# Patient Record
Sex: Female | Born: 1970 | Race: White | Hispanic: No | Smoking: Current every day smoker
Health system: Southern US, Community
[De-identification: ages and names within clinical notes are randomized; demographics above are authoritative.]

## PROBLEM LIST (undated history)

## (undated) DIAGNOSIS — IMO0001 Reserved for inherently not codable concepts without codable children: Secondary | ICD-10-CM

## (undated) DIAGNOSIS — F410 Panic disorder [episodic paroxysmal anxiety] without agoraphobia: Secondary | ICD-10-CM

## (undated) DIAGNOSIS — M1990 Unspecified osteoarthritis, unspecified site: Secondary | ICD-10-CM

## (undated) DIAGNOSIS — J45909 Unspecified asthma, uncomplicated: Secondary | ICD-10-CM

## (undated) DIAGNOSIS — H9190 Unspecified hearing loss, unspecified ear: Secondary | ICD-10-CM

## (undated) DIAGNOSIS — K5900 Constipation, unspecified: Secondary | ICD-10-CM

## (undated) DIAGNOSIS — J189 Pneumonia, unspecified organism: Secondary | ICD-10-CM

## (undated) DIAGNOSIS — K219 Gastro-esophageal reflux disease without esophagitis: Secondary | ICD-10-CM

## (undated) DIAGNOSIS — M797 Fibromyalgia: Secondary | ICD-10-CM

## (undated) DIAGNOSIS — R569 Unspecified convulsions: Secondary | ICD-10-CM

## (undated) DIAGNOSIS — T884XXA Failed or difficult intubation, initial encounter: Secondary | ICD-10-CM

## (undated) HISTORY — PX: SHOULDER SURGERY: SHX246

## (undated) HISTORY — PX: TONSILLECTOMY AND ADENOIDECTOMY: SUR1326

## (undated) HISTORY — PX: OTHER SURGICAL HISTORY: SHX169

## (undated) HISTORY — PX: ABDOMINAL HYSTERECTOMY: SHX81

## (undated) HISTORY — PX: CERVICAL FUSION: SHX112

## (undated) HISTORY — PX: APPENDECTOMY: SHX54

---

## 2002-05-25 ENCOUNTER — Encounter: Payer: Self-pay | Admitting: Emergency Medicine

## 2002-05-25 ENCOUNTER — Inpatient Hospital Stay (HOSPITAL_COMMUNITY): Admission: EM | Admit: 2002-05-25 | Discharge: 2002-05-28 | Payer: Self-pay | Admitting: Emergency Medicine

## 2002-05-26 ENCOUNTER — Encounter: Payer: Self-pay | Admitting: Internal Medicine

## 2002-05-27 ENCOUNTER — Encounter: Payer: Self-pay | Admitting: Internal Medicine

## 2002-09-17 ENCOUNTER — Inpatient Hospital Stay (HOSPITAL_COMMUNITY): Admission: EM | Admit: 2002-09-17 | Discharge: 2002-09-18 | Payer: Self-pay | Admitting: Emergency Medicine

## 2002-09-17 ENCOUNTER — Encounter: Payer: Self-pay | Admitting: Emergency Medicine

## 2002-09-22 ENCOUNTER — Encounter (HOSPITAL_BASED_OUTPATIENT_CLINIC_OR_DEPARTMENT_OTHER): Admission: RE | Admit: 2002-09-22 | Discharge: 2002-12-21 | Payer: Self-pay | Admitting: Orthopedic Surgery

## 2003-01-09 ENCOUNTER — Emergency Department (HOSPITAL_COMMUNITY): Admission: EM | Admit: 2003-01-09 | Discharge: 2003-01-09 | Payer: Self-pay

## 2003-10-15 ENCOUNTER — Emergency Department (HOSPITAL_COMMUNITY): Admission: EM | Admit: 2003-10-15 | Discharge: 2003-10-15 | Payer: Self-pay | Admitting: Emergency Medicine

## 2004-02-04 ENCOUNTER — Emergency Department (HOSPITAL_COMMUNITY): Admission: EM | Admit: 2004-02-04 | Discharge: 2004-02-04 | Payer: Self-pay | Admitting: Emergency Medicine

## 2004-03-01 ENCOUNTER — Emergency Department (HOSPITAL_COMMUNITY): Admission: EM | Admit: 2004-03-01 | Discharge: 2004-03-01 | Payer: Self-pay | Admitting: Emergency Medicine

## 2004-04-18 ENCOUNTER — Emergency Department (HOSPITAL_COMMUNITY): Admission: EM | Admit: 2004-04-18 | Discharge: 2004-04-18 | Payer: Self-pay | Admitting: Emergency Medicine

## 2004-04-20 ENCOUNTER — Emergency Department (HOSPITAL_COMMUNITY): Admission: EM | Admit: 2004-04-20 | Discharge: 2004-04-20 | Payer: Self-pay | Admitting: Emergency Medicine

## 2004-05-03 ENCOUNTER — Emergency Department (HOSPITAL_COMMUNITY): Admission: EM | Admit: 2004-05-03 | Discharge: 2004-05-03 | Payer: Self-pay | Admitting: *Deleted
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
## 2005-01-03 ENCOUNTER — Ambulatory Visit: Payer: Self-pay | Admitting: Psychiatry

## 2005-01-03 ENCOUNTER — Inpatient Hospital Stay (HOSPITAL_COMMUNITY): Admission: EM | Admit: 2005-01-03 | Discharge: 2005-01-08 | Payer: Self-pay | Admitting: Psychiatry

## 2005-01-28 ENCOUNTER — Emergency Department (HOSPITAL_COMMUNITY): Admission: EM | Admit: 2005-01-28 | Discharge: 2005-01-28 | Payer: Self-pay | Admitting: Emergency Medicine

## 2005-04-29 ENCOUNTER — Inpatient Hospital Stay (HOSPITAL_COMMUNITY): Admission: EM | Admit: 2005-04-29 | Discharge: 2005-05-04 | Payer: Self-pay | Admitting: Psychiatry

## 2005-04-30 ENCOUNTER — Ambulatory Visit: Payer: Self-pay | Admitting: Psychiatry

## 2005-06-19 ENCOUNTER — Emergency Department (HOSPITAL_COMMUNITY): Admission: EM | Admit: 2005-06-19 | Discharge: 2005-06-19 | Payer: Self-pay | Admitting: Emergency Medicine

## 2005-07-22 ENCOUNTER — Emergency Department (HOSPITAL_COMMUNITY): Admission: EM | Admit: 2005-07-22 | Discharge: 2005-07-22 | Payer: Self-pay | Admitting: Emergency Medicine

## 2005-10-29 ENCOUNTER — Emergency Department (HOSPITAL_COMMUNITY): Admission: EM | Admit: 2005-10-29 | Discharge: 2005-10-29 | Payer: Self-pay | Admitting: Emergency Medicine

## 2006-04-13 ENCOUNTER — Emergency Department (HOSPITAL_COMMUNITY): Admission: EM | Admit: 2006-04-13 | Discharge: 2006-04-13 | Payer: Self-pay | Admitting: Emergency Medicine
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
## 2007-03-29 ENCOUNTER — Emergency Department (HOSPITAL_COMMUNITY): Admission: EM | Admit: 2007-03-29 | Discharge: 2007-03-29 | Payer: Self-pay | Admitting: Emergency Medicine

## 2007-06-03 ENCOUNTER — Ambulatory Visit (HOSPITAL_COMMUNITY): Admission: RE | Admit: 2007-06-03 | Discharge: 2007-06-03 | Payer: Self-pay | Admitting: Orthopedic Surgery

## 2007-10-06 ENCOUNTER — Encounter
Admission: RE | Admit: 2007-10-06 | Discharge: 2008-01-04 | Payer: Self-pay | Admitting: Physical Medicine & Rehabilitation

## 2007-10-07 ENCOUNTER — Emergency Department (HOSPITAL_COMMUNITY): Admission: EM | Admit: 2007-10-07 | Discharge: 2007-10-07 | Payer: Self-pay | Admitting: Emergency Medicine

## 2007-10-16 ENCOUNTER — Ambulatory Visit: Payer: Self-pay | Admitting: Physical Medicine & Rehabilitation

## 2007-11-17 ENCOUNTER — Ambulatory Visit: Payer: Self-pay | Admitting: Physical Medicine & Rehabilitation

## 2008-01-28 ENCOUNTER — Encounter
Admission: RE | Admit: 2008-01-28 | Discharge: 2008-02-02 | Payer: Self-pay | Admitting: Physical Medicine & Rehabilitation

## 2008-02-02 ENCOUNTER — Ambulatory Visit: Payer: Self-pay | Admitting: Physical Medicine & Rehabilitation

## 2008-02-22 ENCOUNTER — Emergency Department (HOSPITAL_COMMUNITY): Admission: EM | Admit: 2008-02-22 | Discharge: 2008-02-22 | Payer: Self-pay | Admitting: Emergency Medicine

## 2008-02-23 ENCOUNTER — Emergency Department (HOSPITAL_COMMUNITY): Admission: EM | Admit: 2008-02-23 | Discharge: 2008-02-24 | Payer: Self-pay | Admitting: Emergency Medicine
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
## 2008-02-27 ENCOUNTER — Emergency Department (HOSPITAL_COMMUNITY): Admission: EM | Admit: 2008-02-27 | Discharge: 2008-02-27 | Payer: Self-pay | Admitting: Emergency Medicine

## 2008-02-29 ENCOUNTER — Emergency Department (HOSPITAL_COMMUNITY): Admission: EM | Admit: 2008-02-29 | Discharge: 2008-02-29 | Payer: Self-pay | Admitting: Emergency Medicine

## 2008-03-02 ENCOUNTER — Emergency Department (HOSPITAL_COMMUNITY): Admission: EM | Admit: 2008-03-02 | Discharge: 2008-03-02 | Payer: Self-pay | Admitting: Emergency Medicine

## 2008-03-07 ENCOUNTER — Emergency Department (HOSPITAL_COMMUNITY): Admission: EM | Admit: 2008-03-07 | Discharge: 2008-03-07 | Payer: Self-pay | Admitting: Emergency Medicine

## 2008-04-23 ENCOUNTER — Encounter
Admission: RE | Admit: 2008-04-23 | Discharge: 2008-04-27 | Payer: Self-pay | Admitting: Physical Medicine & Rehabilitation

## 2008-04-27 ENCOUNTER — Ambulatory Visit: Payer: Self-pay | Admitting: Physical Medicine & Rehabilitation

## 2008-05-09 ENCOUNTER — Emergency Department (HOSPITAL_COMMUNITY): Admission: EM | Admit: 2008-05-09 | Discharge: 2008-05-09 | Payer: Self-pay | Admitting: Emergency Medicine

## 2008-05-10 ENCOUNTER — Emergency Department (HOSPITAL_COMMUNITY): Admission: EM | Admit: 2008-05-10 | Discharge: 2008-05-10 | Payer: Self-pay | Admitting: Emergency Medicine

## 2008-05-15 ENCOUNTER — Emergency Department (HOSPITAL_COMMUNITY): Admission: EM | Admit: 2008-05-15 | Discharge: 2008-05-15 | Payer: Self-pay | Admitting: Emergency Medicine
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
## 2008-08-23 ENCOUNTER — Encounter
Admission: RE | Admit: 2008-08-23 | Discharge: 2008-08-24 | Payer: Self-pay | Admitting: Physical Medicine & Rehabilitation

## 2008-08-24 ENCOUNTER — Ambulatory Visit: Payer: Self-pay | Admitting: Physical Medicine & Rehabilitation

## 2008-11-21 IMAGING — CT CT PELVIS W/ CM
2 of 5 series · 17 of 46 positions shown, 19 images · IV contrast (APPLIED)
Comparison: Head CT 02/04/2004

CT HEAD

CLINICAL DATA: Fall, headache, right upper quadrant abdominal
pain.

CT HEAD WITHOUT CONTRAST
CT ABDOMEN AND PELVIS WITH CONTRAST
TECHNIQUE: Multidetector CT imaging of the head was performed
following the standard protocol without intravenous contrast.
Multidetector CT imaging of the abdomen and pelvis was performed
following the standard protocol during bolus administration of
intravenous contrast.
Contrast: 100 ml Omniscan 300 IV contrast

[Series 2: abd_pel 5.0 b40f st · axial · 0.59mm/px · z∈[+351,+751]mm · 14 of 90 slices shown, 16 images]
[im 5/90  soft-tissue]
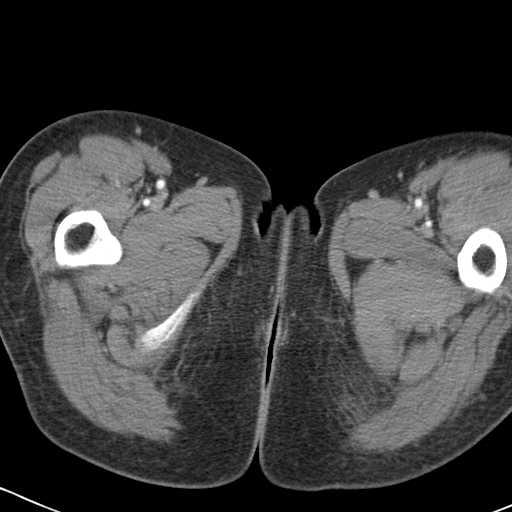
[im 5/90  bone]
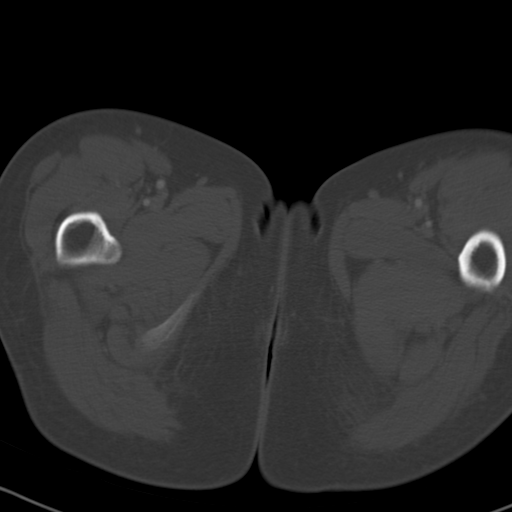
[im 14/90  soft-tissue]
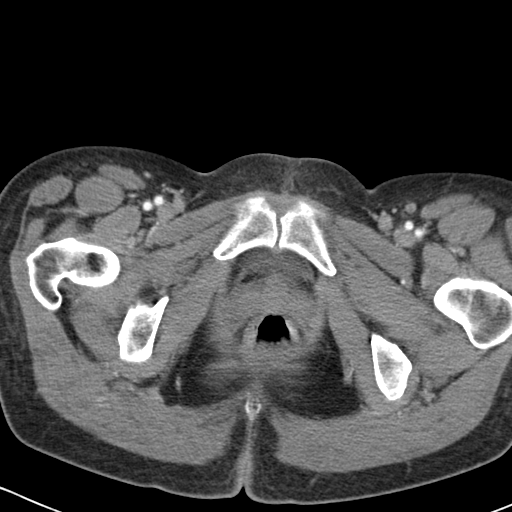
[im 18/90  soft-tissue]
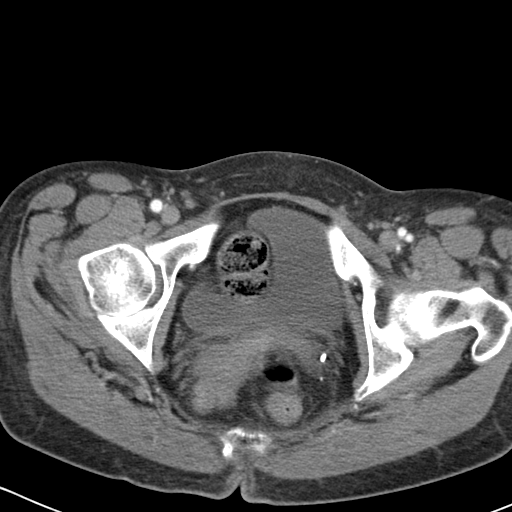
[im 23/90  soft-tissue]
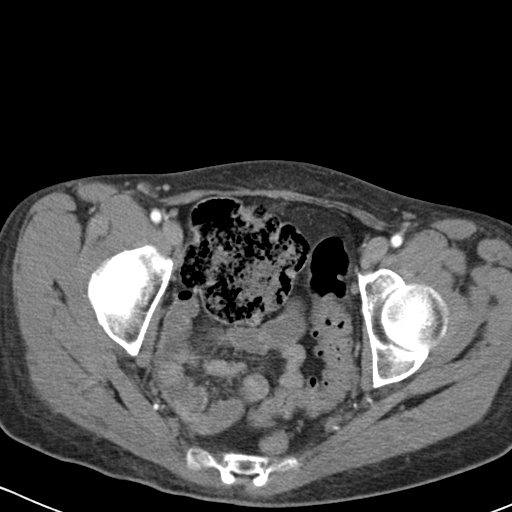
[im 32/90  soft-tissue]
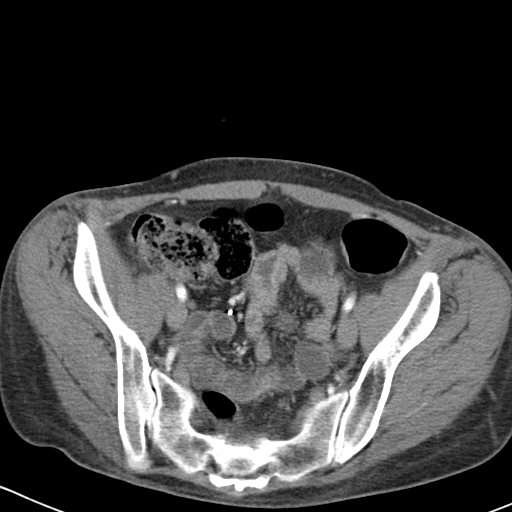
[im 36/90  soft-tissue]
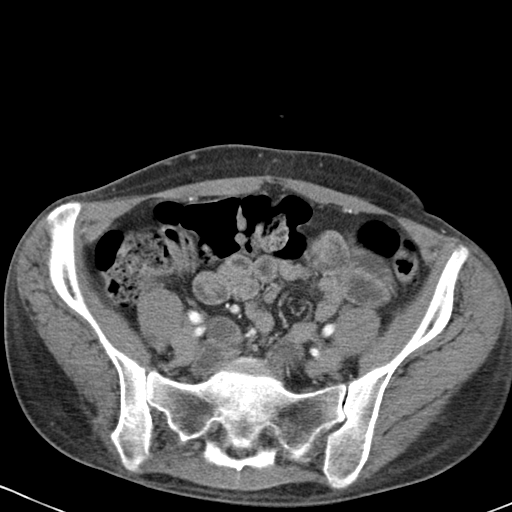
[im 41/90  soft-tissue]
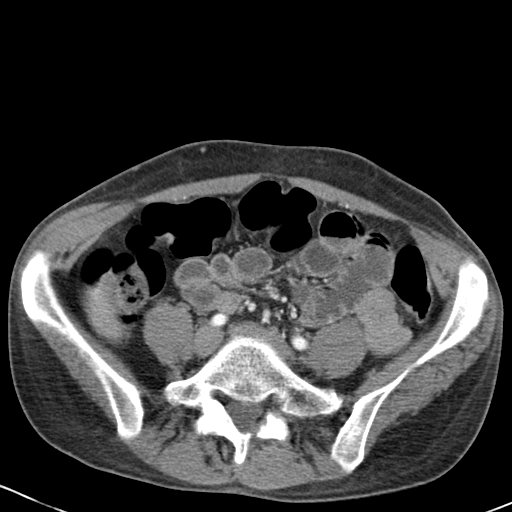
[im 49/90  soft-tissue]
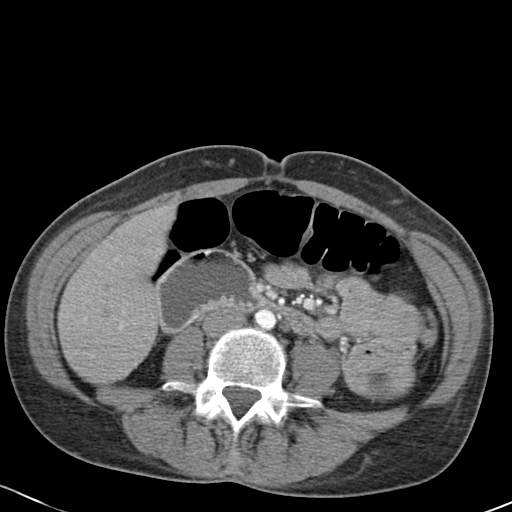
[im 54/90  soft-tissue]
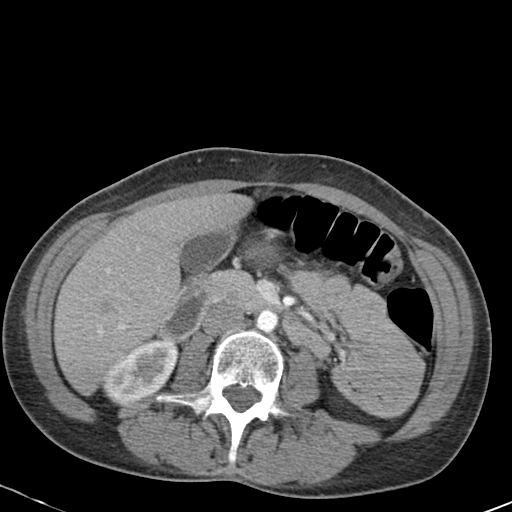
[im 54/90  bone]
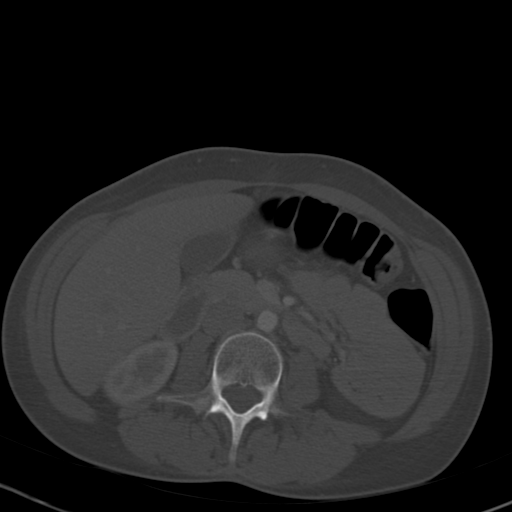
[im 58/90  soft-tissue]
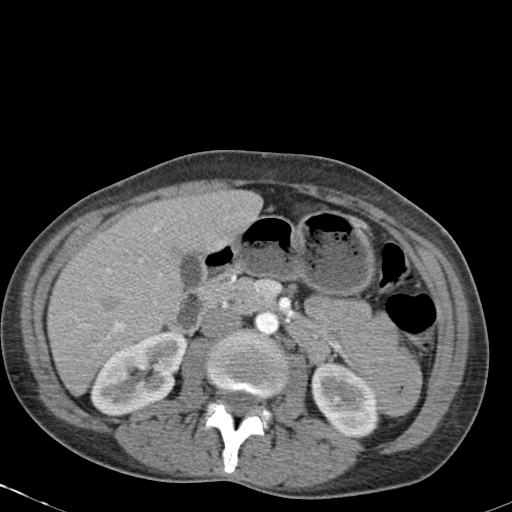
[im 67/90  soft-tissue]
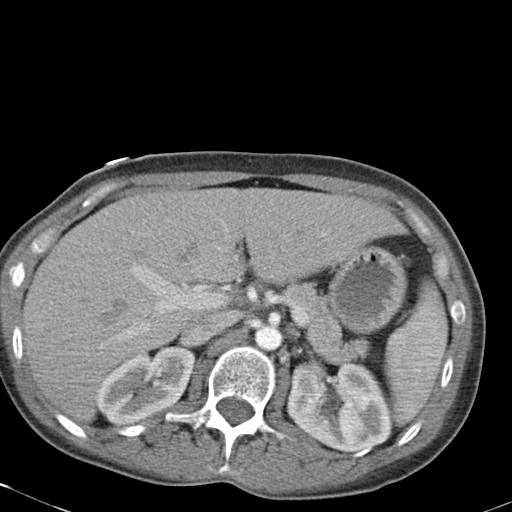
[im 72/90  soft-tissue]
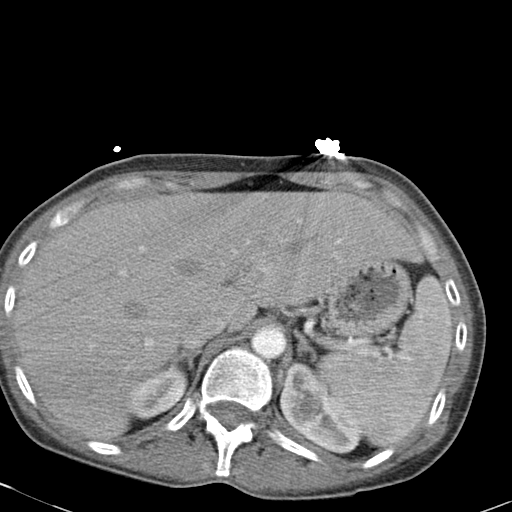
[im 76/90  soft-tissue]
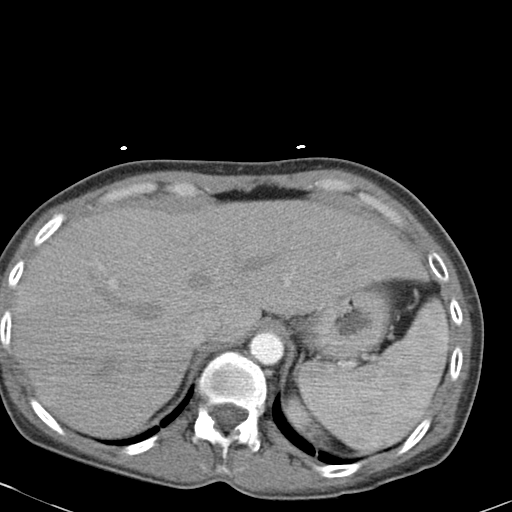
[im 85/90  soft-tissue]
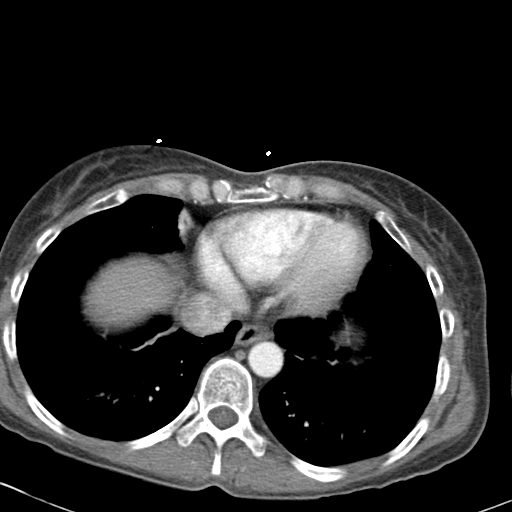

[Series 602: coronal · coronal · 0.91mm/px · 3 of 57 slices shown]
[im 19/57  soft-tissue]
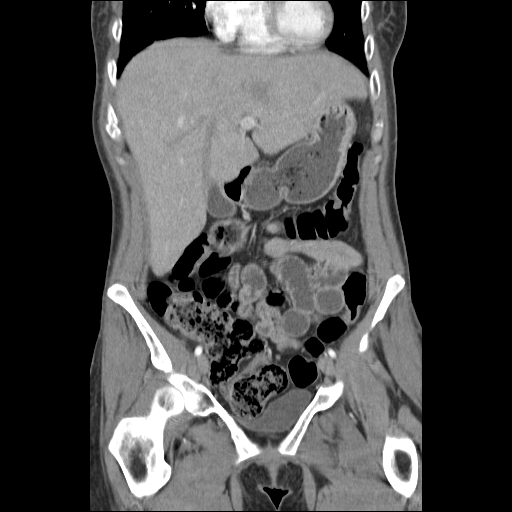
[im 25/57  soft-tissue]
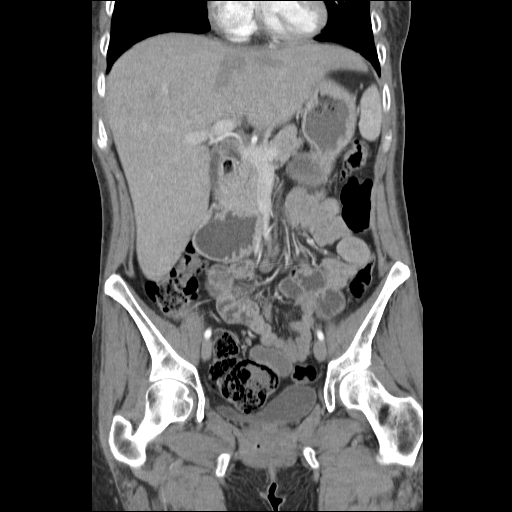
[im 32/57  soft-tissue]
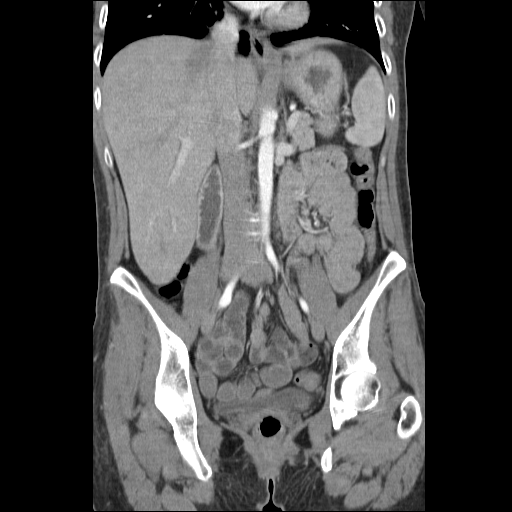

[17 of 46 positions shown; findings below may reference images not displayed]

FINDINGS: No acute hemorrhage, acute infarct, or mass lesion is
identified.  No midline shift.  No ventriculomegaly.  Orbits and
paranasal sinuses are intact.  No skull fracture.
IMPRESSION: No acute intracranial finding.

CT ABDOMEN
FINDINGS: Lung bases are clear.  Liver, gallbladder, adrenal
glands, kidneys, spleen, and pancreas are normal.  The proximal
duodenum is fluid filled and distended but there is no evidence of
more proximal gastric obstruction or mass lesion.  No evidence for
malrotation.  No duodenal wall thickening.
IMPRESSION: No acute intra-abdominal pathology.

CT PELVIS
FINDINGS: Unenhanced bowel is unremarkable.  The patient appears
to be status post hysterectomy.  No pelvic free fluid or
lymphadenopathy.  Right lateral 7th rib fracture is identified on
image 17. Right lateral eighth rib fracture is possible but there
is motion artifact directly at this level and that could produce a
false positive appearance of a fracture.
IMPRESSION: Right anterior 7th and possibly 8th nondisplaced rib fractures.
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
## 2008-12-05 IMAGING — CR DG CHEST 2V
2 series · 2 of 2 positions shown · non-contrast
Comparison: 02/29/2008

CLINICAL DATA: Shortness of breath, chest pain

CHEST - 2 VIEW

[w chest pa]
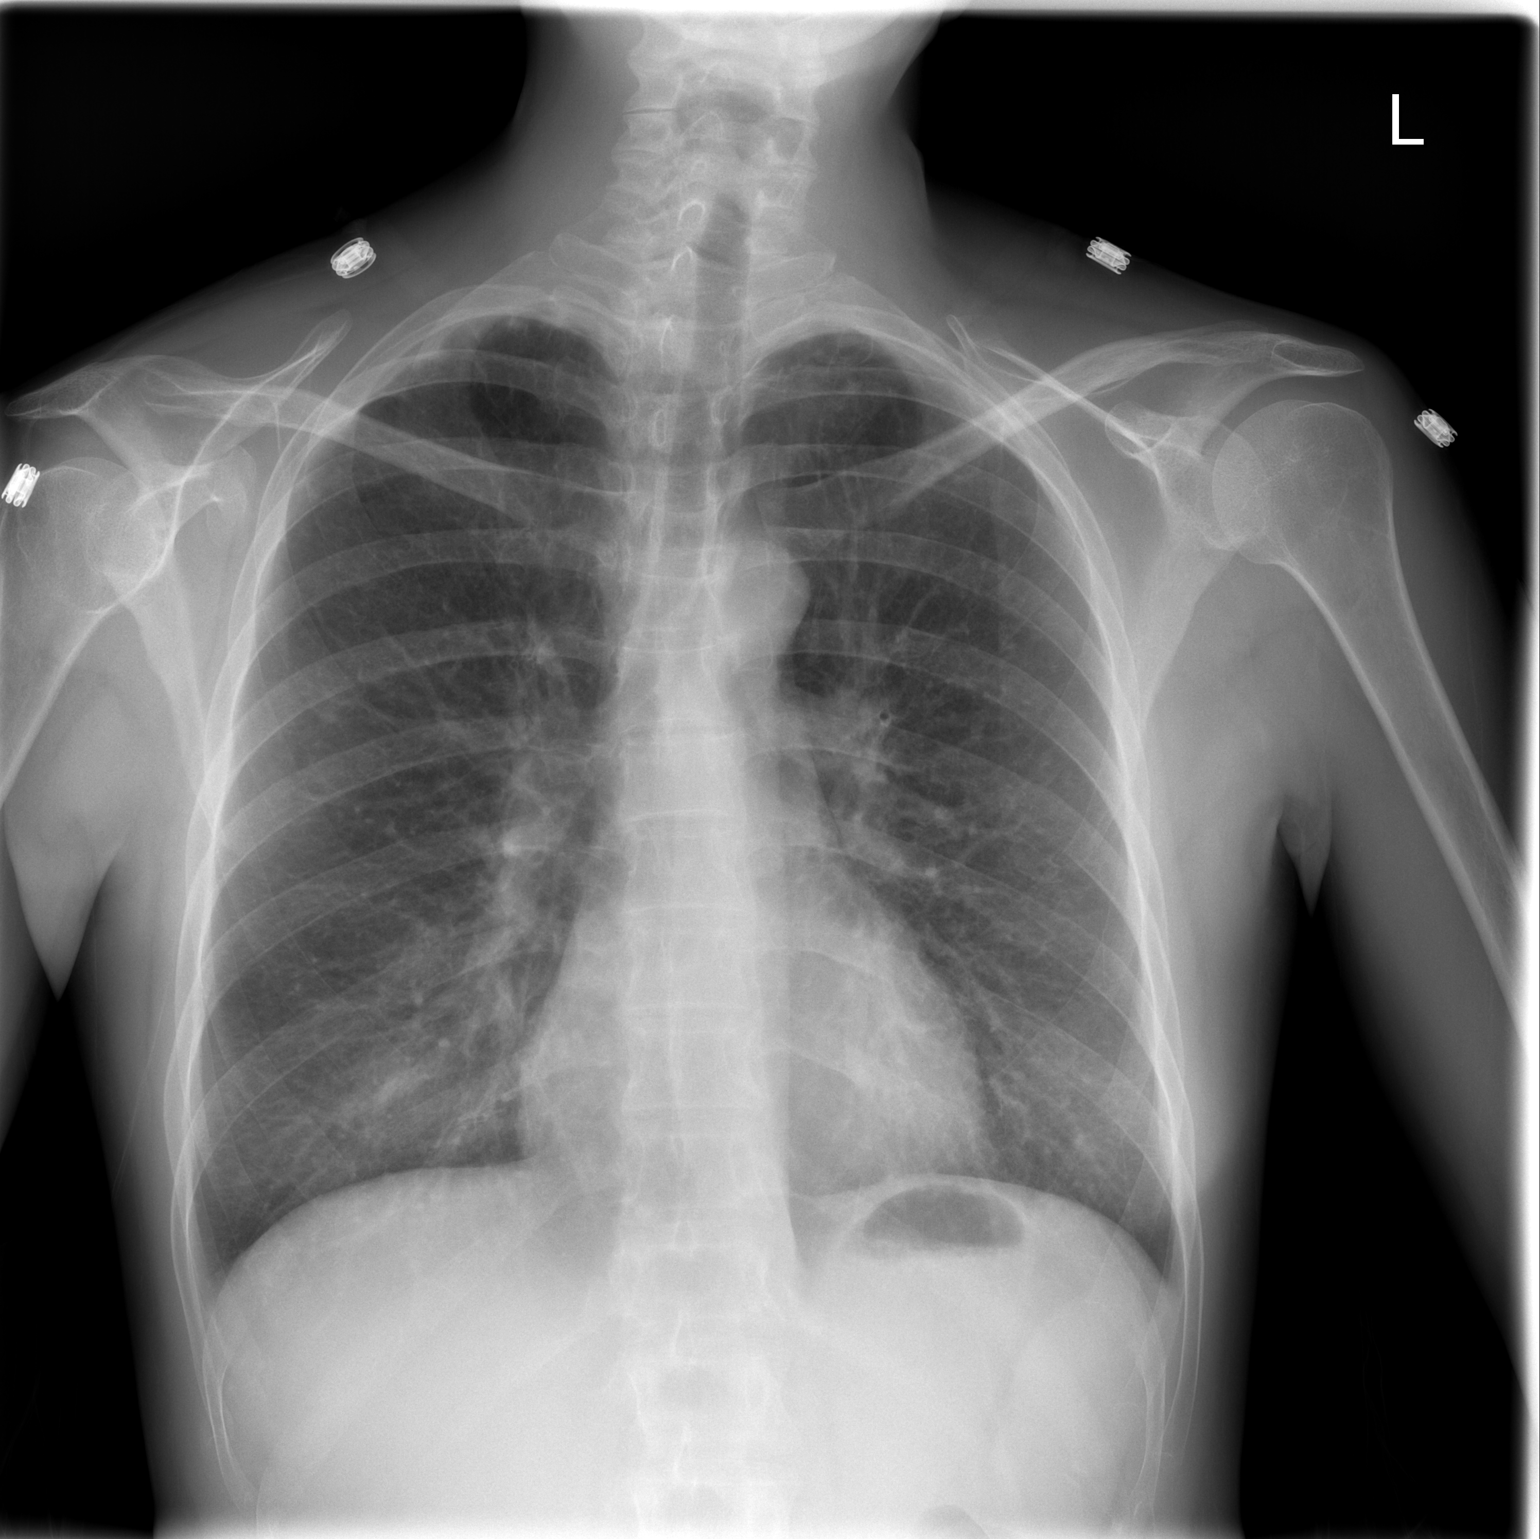

[w chest lat]
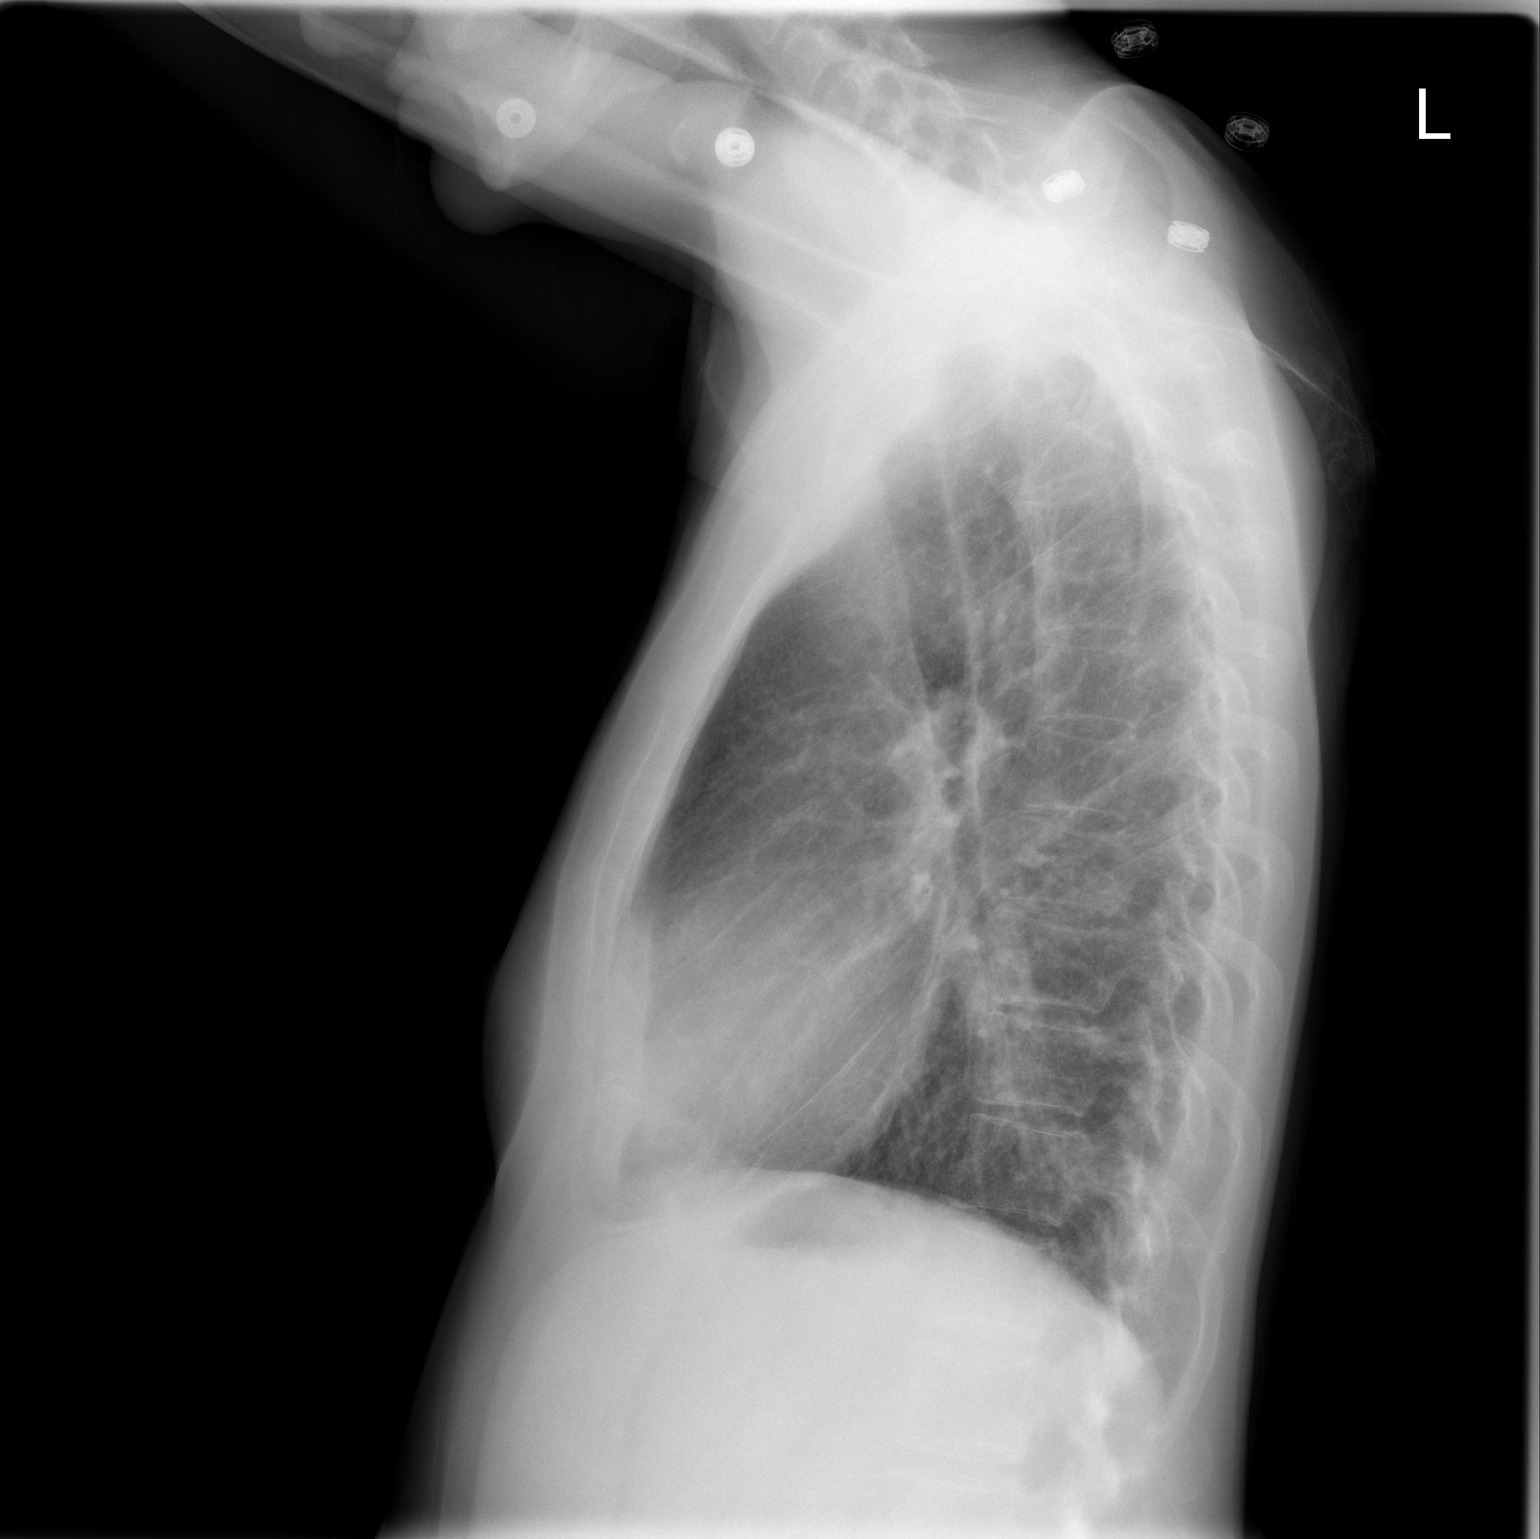

[2 of 2 positions shown; findings below may reference images not displayed]

FINDINGS: Normal heart size, mediastinal contours, and pulmonary vascularity.
Mild chronic bronchitic changes.
Minimal apical scarring.
Tiny nodular density right upper lobe, 5 mm diameter, likely
corresponding to a tiny nodule seen on chest CT of 03/02/2008.
Lungs otherwise clear.
No pleural effusion or pneumothorax.
No acute bony abnormalities.
IMPRESSION: Persistent bronchitic changes.
5 mm diameter right upper lobe pulmonary nodule, per
recommendations of preceding CT chest of 03/02/2008, recommend
follow-up CT chest in 4 months.

## 2008-12-10 ENCOUNTER — Encounter
Admission: RE | Admit: 2008-12-10 | Discharge: 2008-12-14 | Payer: Self-pay | Admitting: Physical Medicine & Rehabilitation

## 2008-12-14 ENCOUNTER — Ambulatory Visit: Payer: Self-pay | Admitting: Physical Medicine & Rehabilitation

## 2009-02-06 IMAGING — CR DG SHOULDER 2+V*R*
3 series · 3 of 3 positions shown · non-contrast
Comparison: Right shoulder radiographs 04/13/2006

CLINICAL DATA: Fall

RIGHT SHOULDER - 2+ VIEW

[w shoulder ap internal righ]
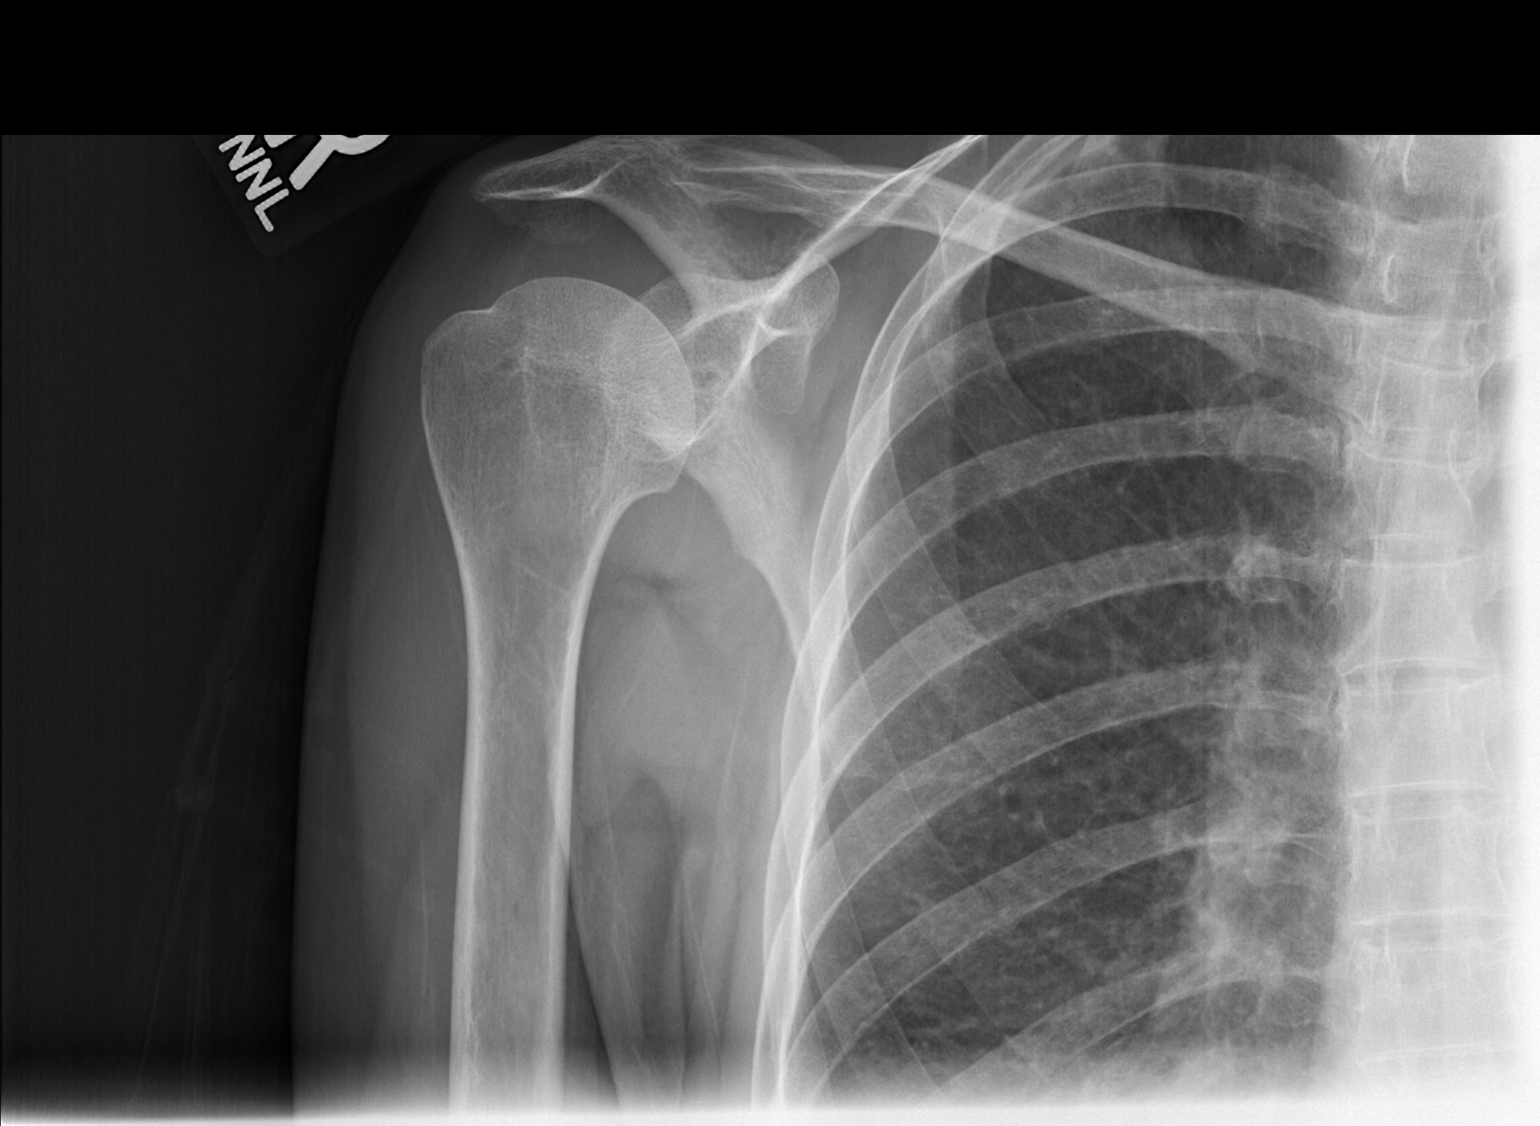

[w shoulder ap external righ]
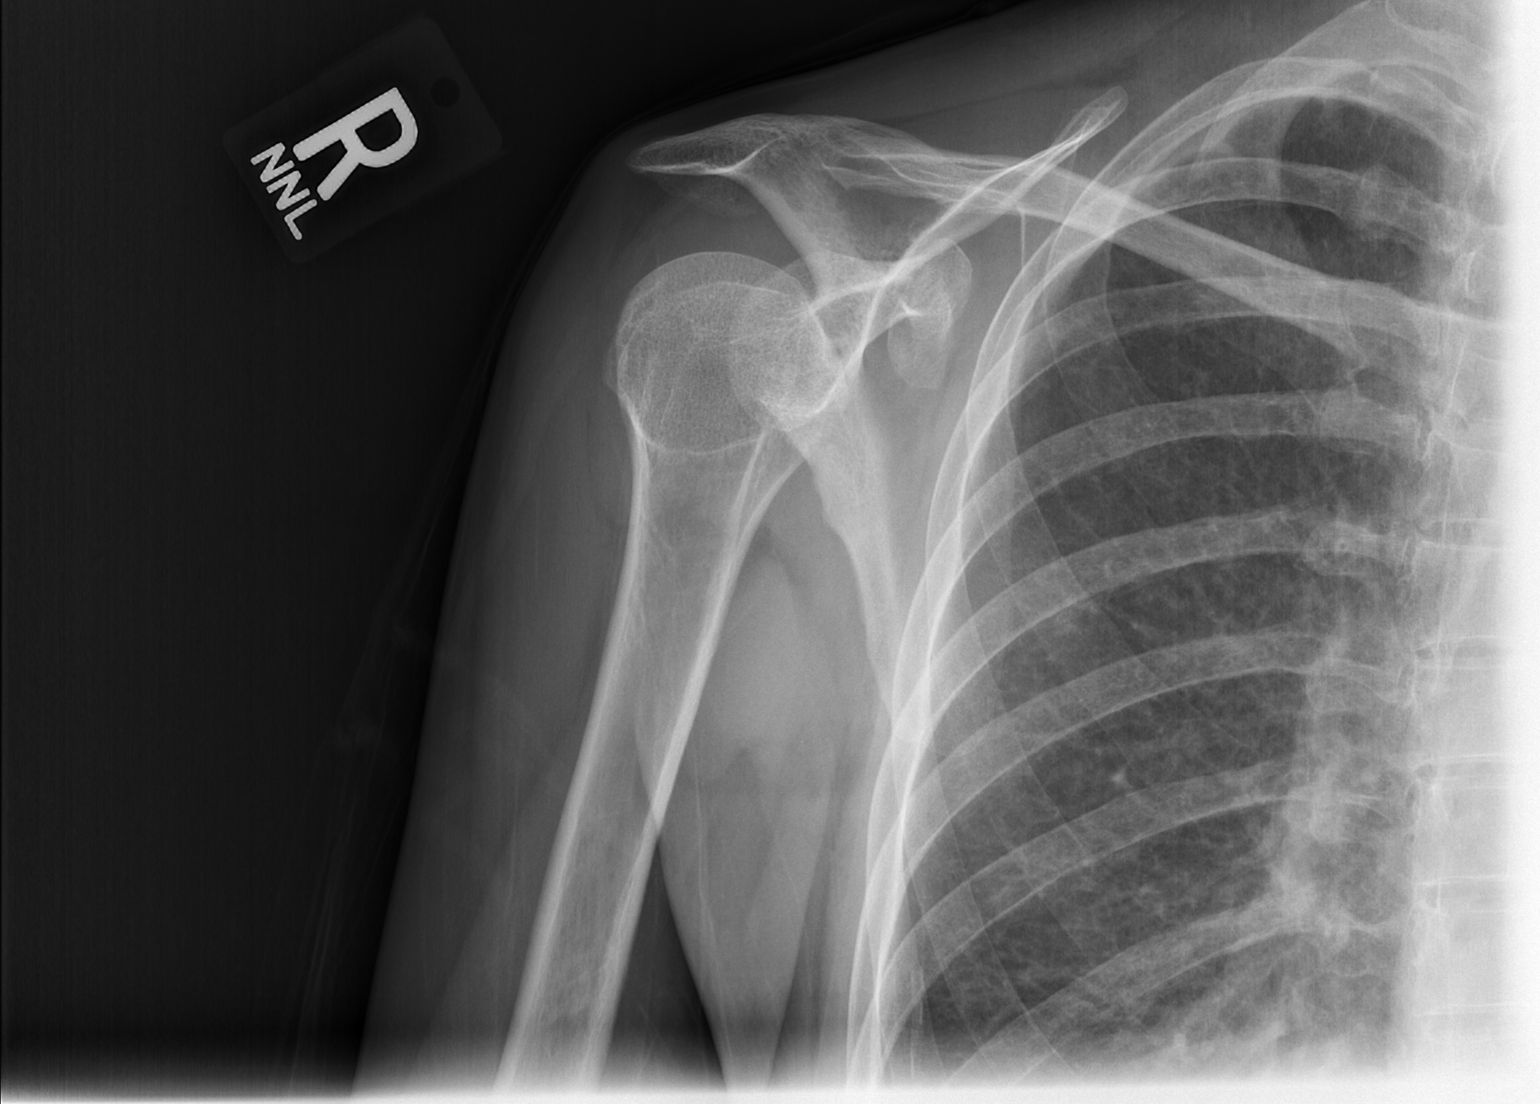

[w shoulder y view right]
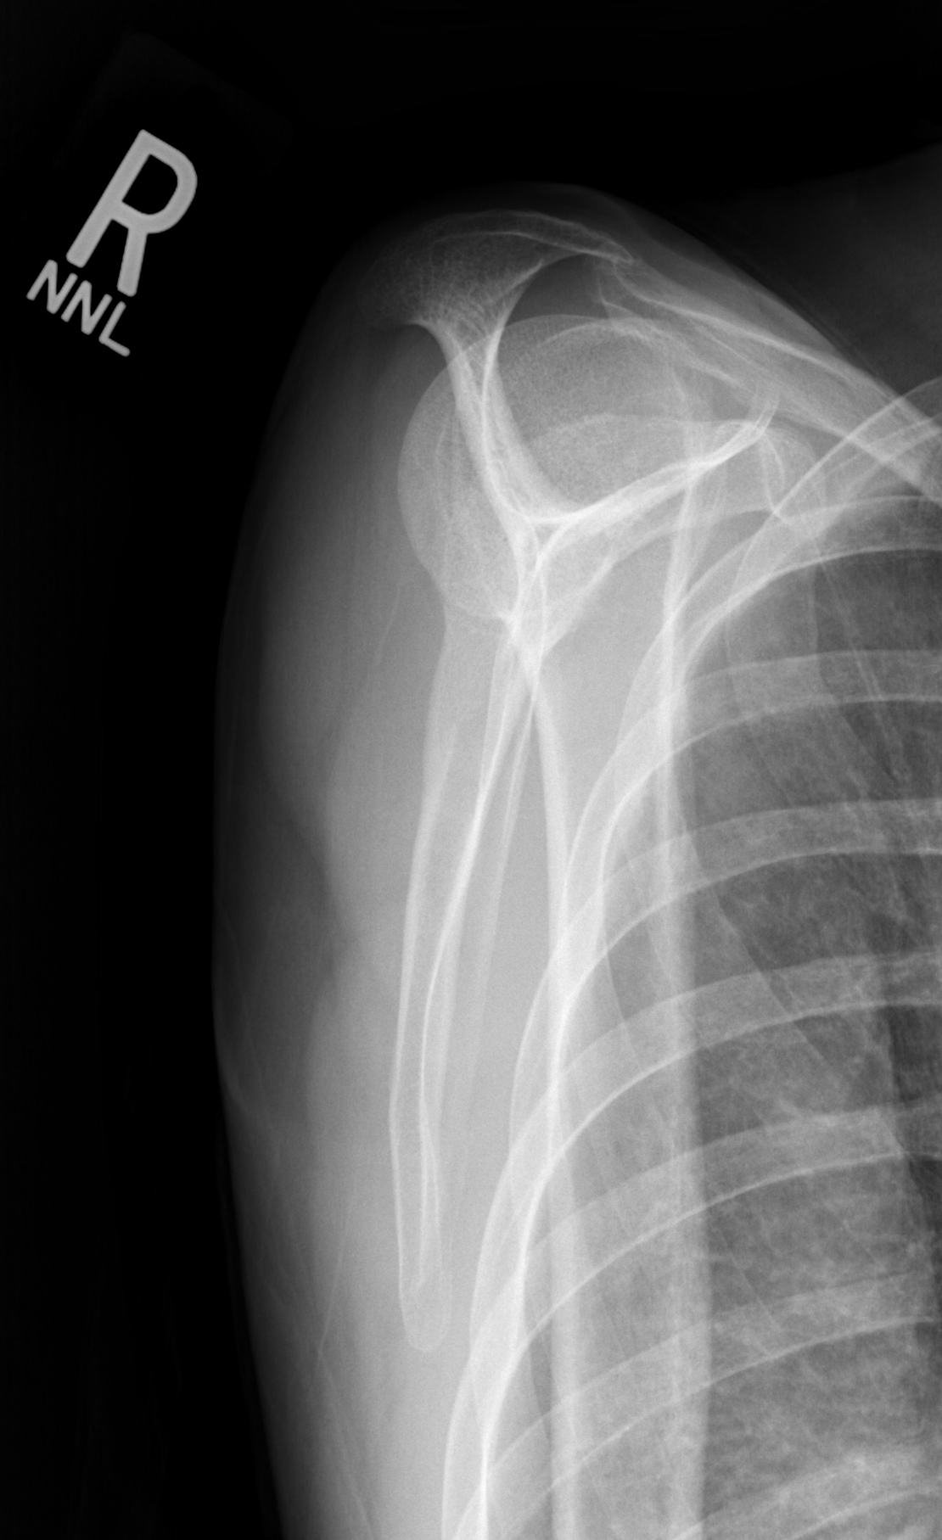

[3 of 3 positions shown; findings below may reference images not displayed]

FINDINGS: The right humeral head is located within the glenoid
fossa.  The humeral head appears inferiorly subluxed relative to
the acromion, representing an interval change compared to the
April 13, 2006 examination.  No acute fracture is identified.
The right clavicle is intact and the acromioclavicular joint is
aligned.
IMPRESSION: 1. Inferior subluxation of the right humeral head.
2.  No evidence of acute fracture.

## 2009-02-06 IMAGING — CR DG SHOULDER 2+V*R*
2 series · 2 of 2 positions shown · non-contrast
Comparison: Right shoulder radiographs 04/13/2006

CLINICAL DATA: Fall

RIGHT SHOULDER - 2+ VIEW

[x shoulder axillary right (1 of 2)]
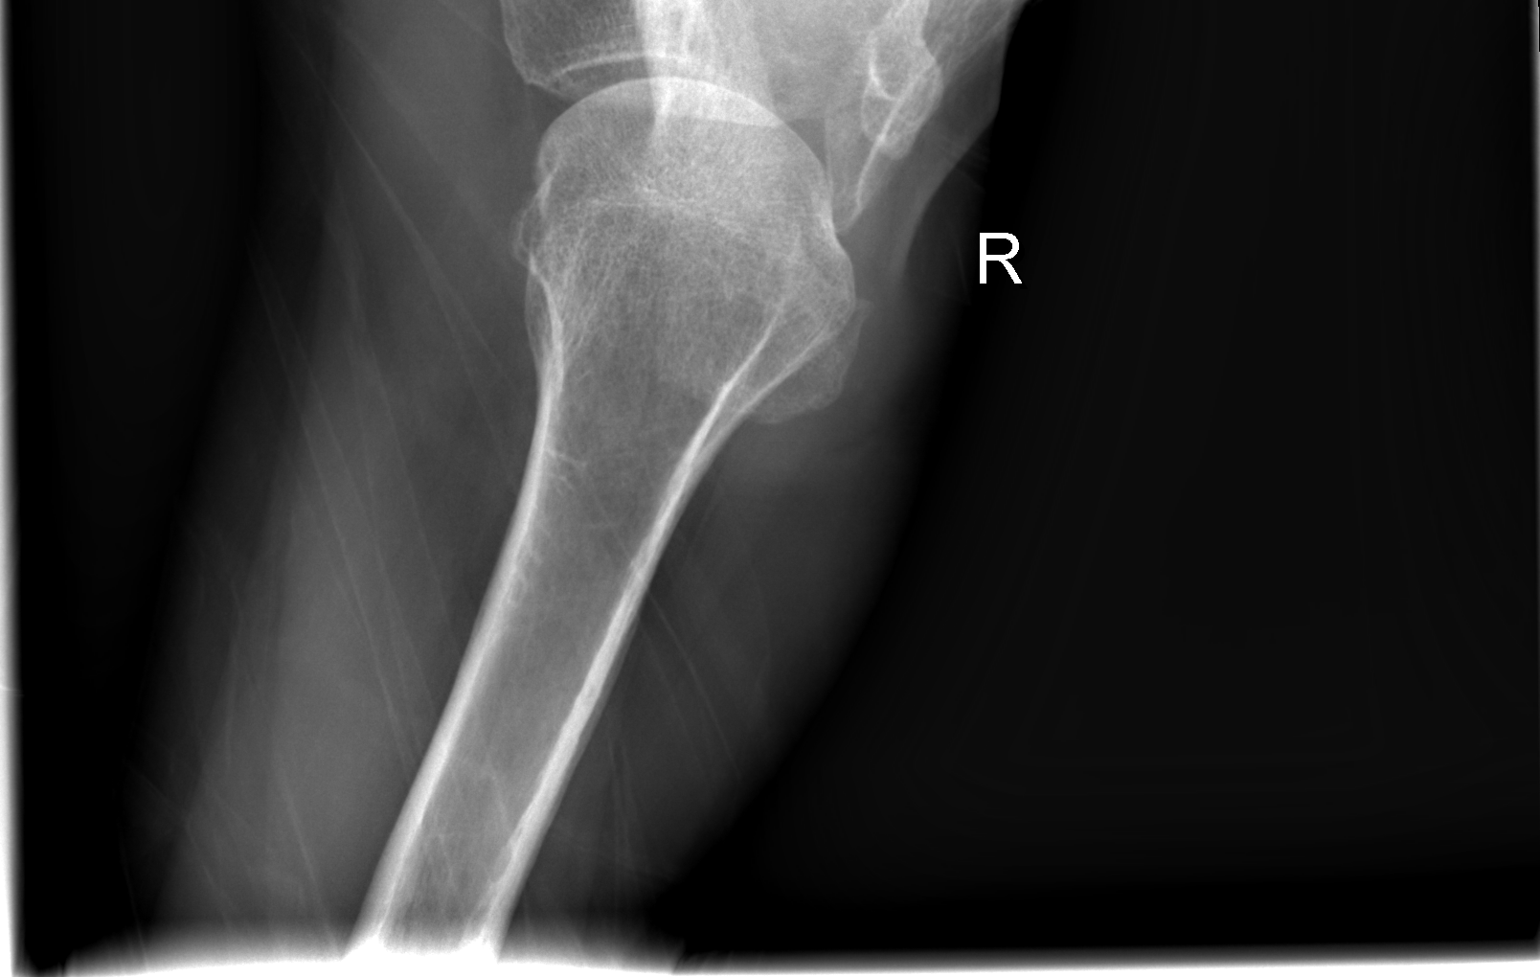

[x shoulder axillary right (2 of 2)]
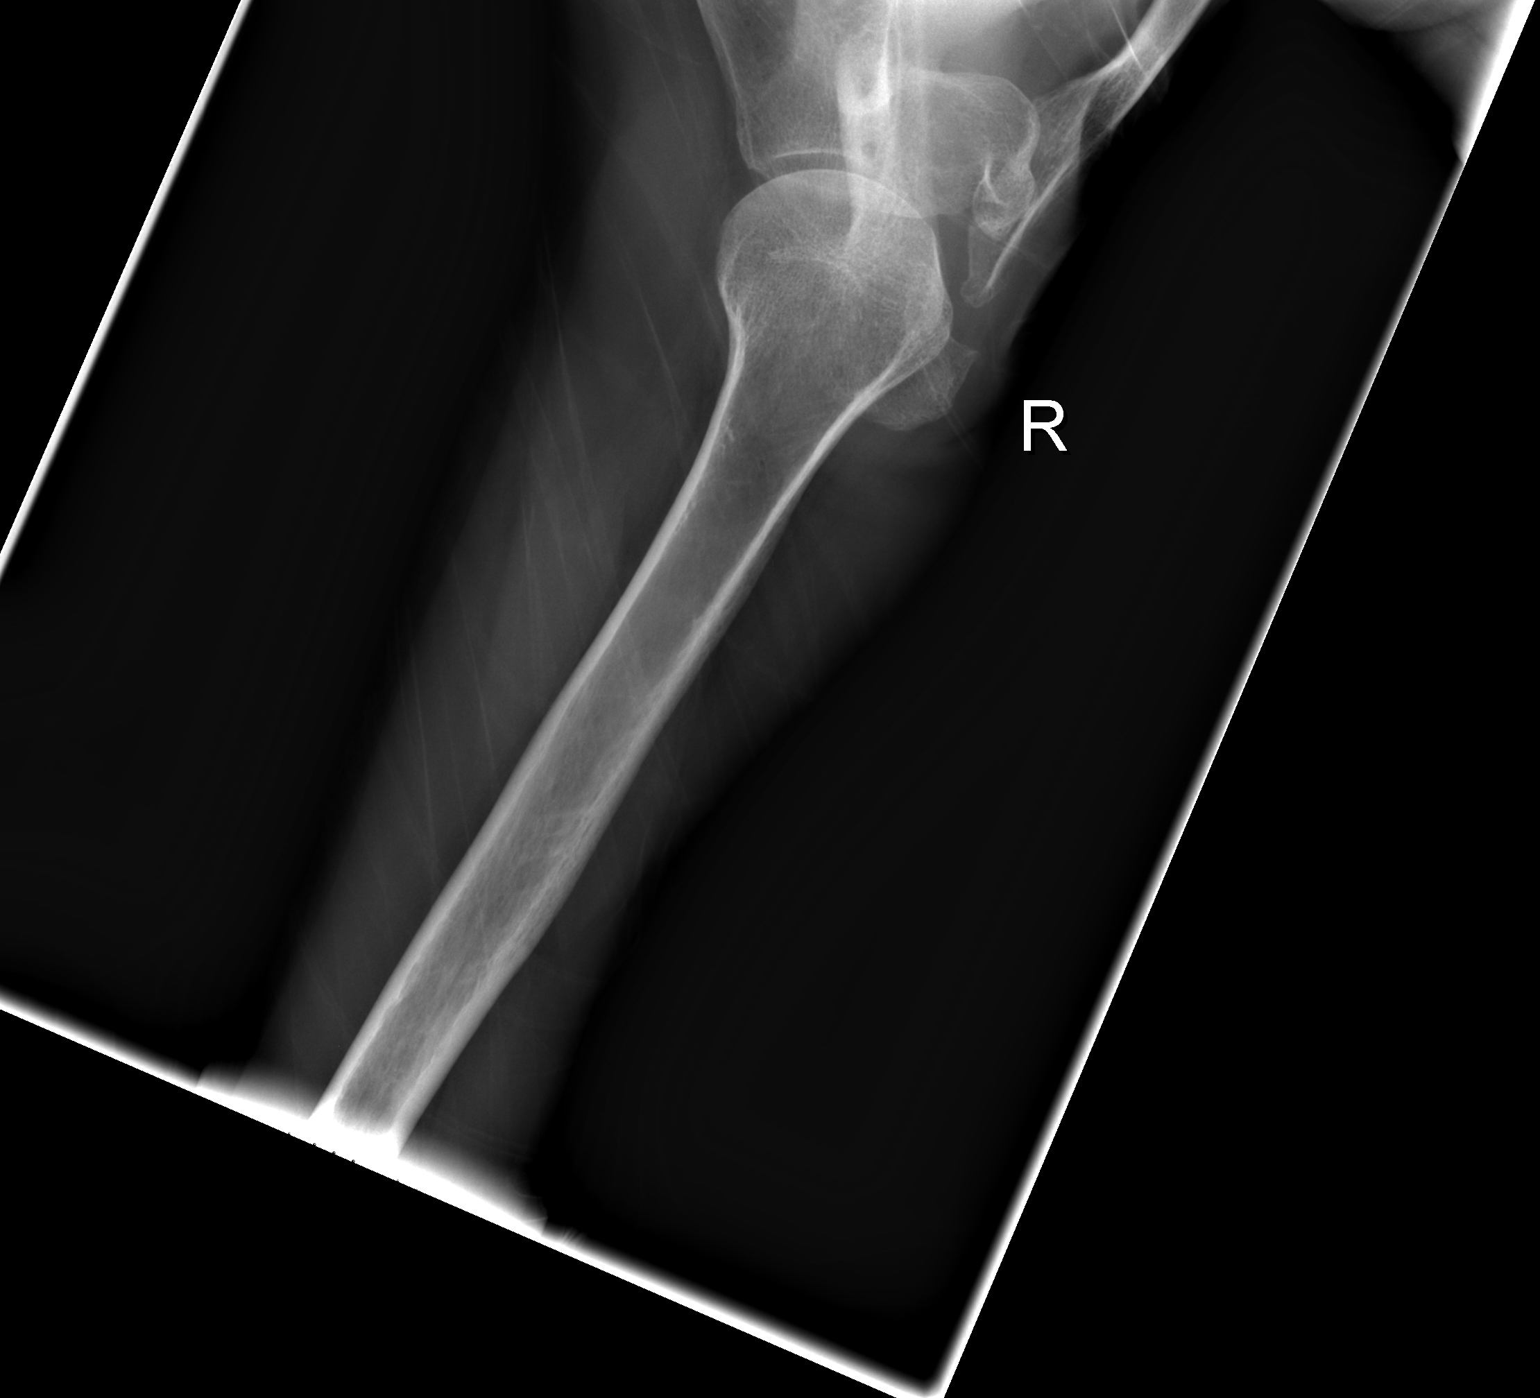

[2 of 2 positions shown; findings below may reference images not displayed]

FINDINGS: The right humeral head is located within the glenoid
fossa.  The humeral head appears inferiorly subluxed relative to
the acromion, representing an interval change compared to the
April 13, 2006 examination.  No acute fracture is identified.
The right clavicle is intact and the acromioclavicular joint is
aligned.
IMPRESSION: 1. Inferior subluxation of the right humeral head.
2.  No evidence of acute fracture.

## 2009-04-23 ENCOUNTER — Emergency Department (HOSPITAL_COMMUNITY): Admission: EM | Admit: 2009-04-23 | Discharge: 2009-04-23 | Payer: Self-pay | Admitting: Emergency Medicine

## 2009-05-11 ENCOUNTER — Ambulatory Visit (HOSPITAL_COMMUNITY): Admission: RE | Admit: 2009-05-11 | Discharge: 2009-05-12 | Payer: Self-pay | Admitting: Orthopaedic Surgery

## 2009-05-16 ENCOUNTER — Encounter
Admission: RE | Admit: 2009-05-16 | Discharge: 2009-05-20 | Payer: Self-pay | Admitting: Physical Medicine & Rehabilitation

## 2009-05-16 ENCOUNTER — Emergency Department (HOSPITAL_COMMUNITY): Admission: EM | Admit: 2009-05-16 | Discharge: 2009-05-16 | Payer: Self-pay | Admitting: Emergency Medicine

## 2009-05-17 ENCOUNTER — Ambulatory Visit: Payer: Self-pay | Admitting: Physical Medicine & Rehabilitation

## 2010-02-08 IMAGING — RF DG CERVICAL SPINE 2 OR 3 VIEWS
1 series · 3 of 3 positions shown · non-contrast
Comparison: None

CLINICAL DATA: Cervical fusion.

CERVICAL SPINE - 2-3 VIEW

[Series 1: run · 3 of 3 slices shown]
[im 1/3]
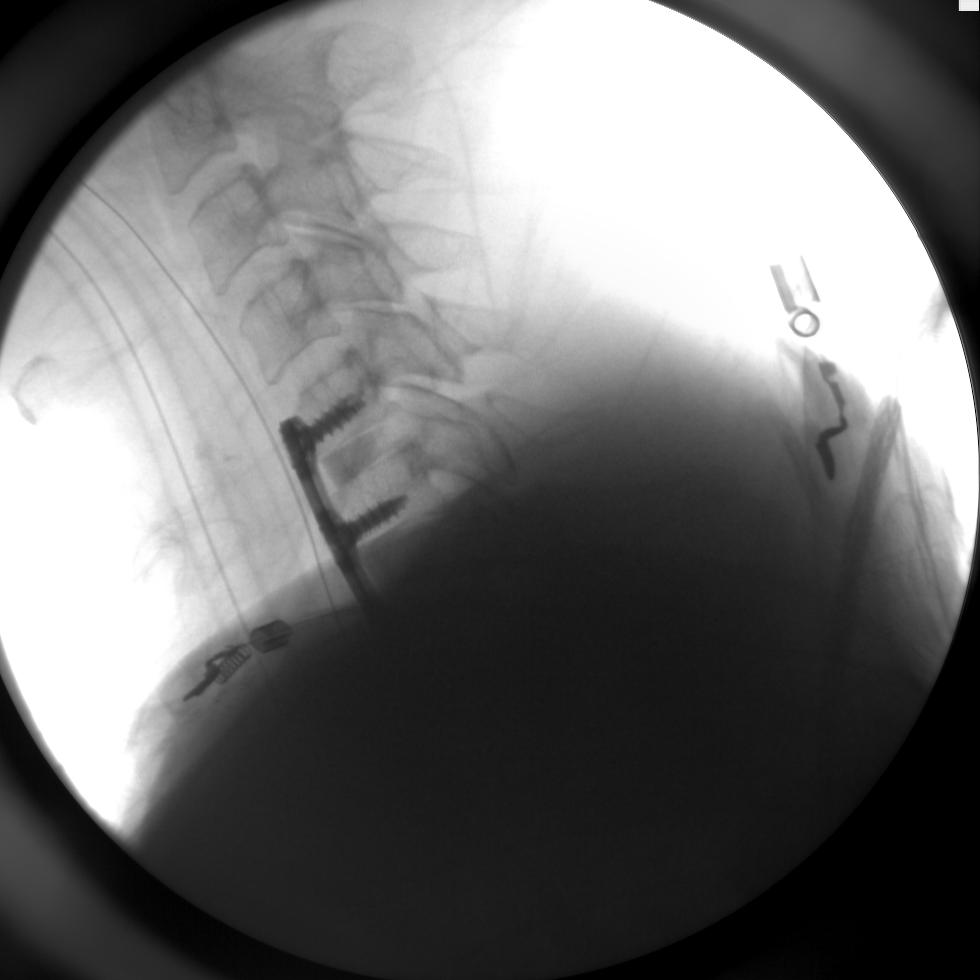
[im 2/3]
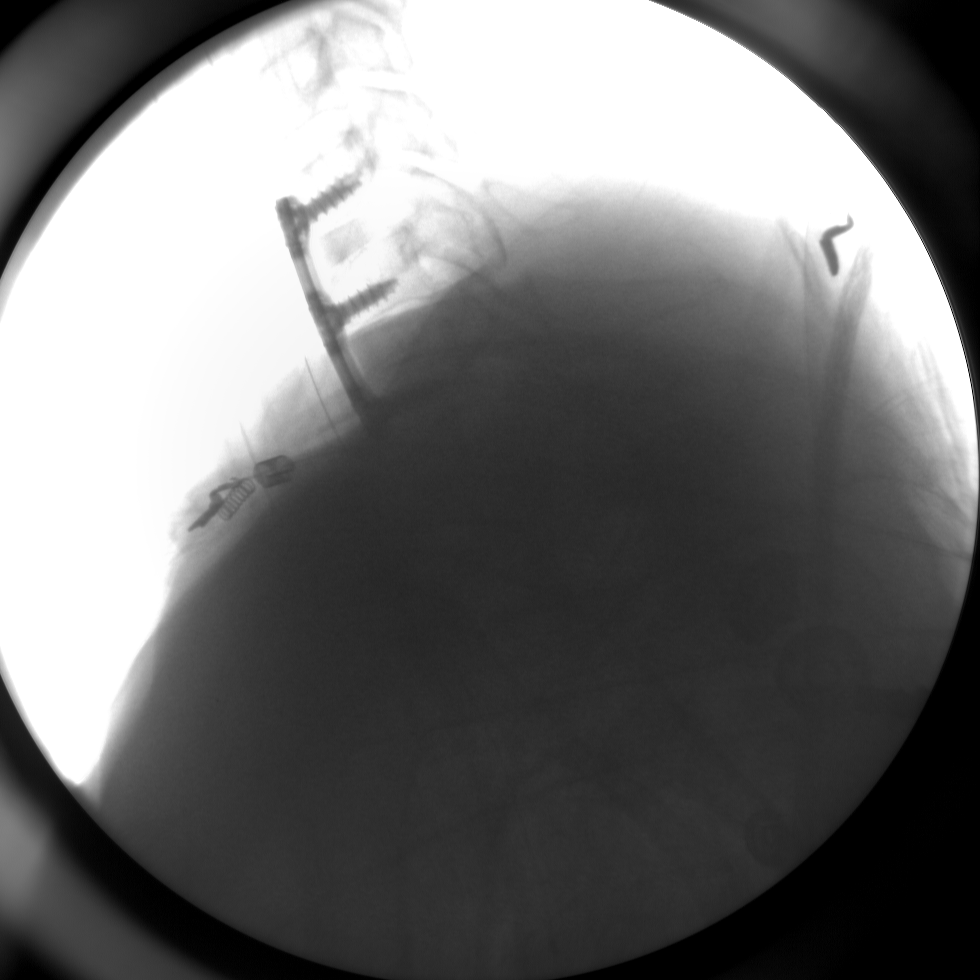
[im 3/3]
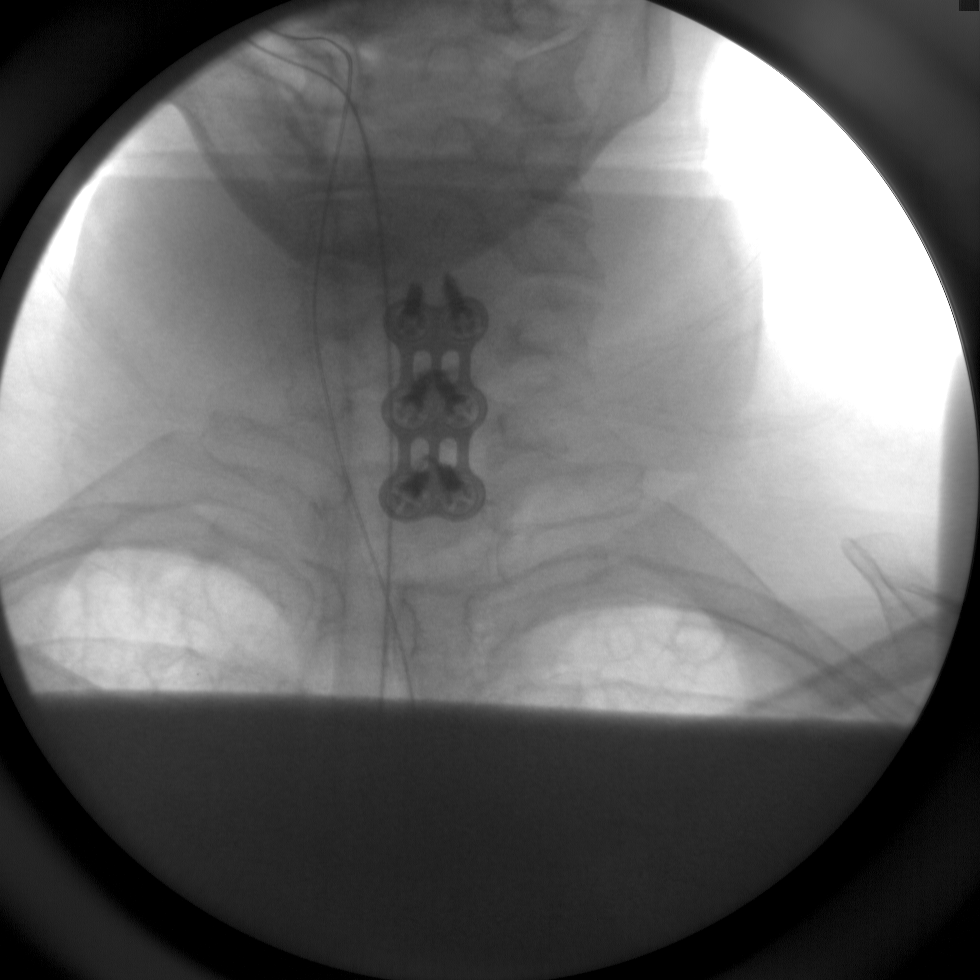

[3 of 3 positions shown; findings below may reference images not displayed]

FINDINGS: Three spot images of the cervical spine from the
operating room demonstrate anterior plate and screws and interbody
bone plugs fusing C5-6 and C6-7.  Normal alignment and no
complicating features.
IMPRESSION: C5-6 and C6-7 fusion.

## 2010-05-30 ENCOUNTER — Emergency Department (HOSPITAL_COMMUNITY): Admission: EM | Admit: 2010-05-30 | Discharge: 2010-05-30 | Payer: Self-pay | Admitting: Emergency Medicine

## 2010-08-25 ENCOUNTER — Encounter: Payer: Self-pay | Admitting: Pulmonary Disease

## 2010-09-03 ENCOUNTER — Encounter: Payer: Self-pay | Admitting: Orthopedic Surgery

## 2010-09-22 DIAGNOSIS — D34 Benign neoplasm of thyroid gland: Secondary | ICD-10-CM | POA: Insufficient documentation

## 2010-09-22 DIAGNOSIS — E785 Hyperlipidemia, unspecified: Secondary | ICD-10-CM | POA: Insufficient documentation

## 2010-09-25 ENCOUNTER — Institutional Professional Consult (permissible substitution): Payer: Self-pay | Admitting: Pulmonary Disease

## 2010-10-13 ENCOUNTER — Institutional Professional Consult (permissible substitution) (INDEPENDENT_AMBULATORY_CARE_PROVIDER_SITE_OTHER): Payer: Medicaid Other | Admitting: Pulmonary Disease

## 2010-10-13 ENCOUNTER — Encounter: Payer: Self-pay | Admitting: Pulmonary Disease

## 2010-10-13 DIAGNOSIS — H9190 Unspecified hearing loss, unspecified ear: Secondary | ICD-10-CM | POA: Insufficient documentation

## 2010-10-13 DIAGNOSIS — J309 Allergic rhinitis, unspecified: Secondary | ICD-10-CM | POA: Insufficient documentation

## 2010-10-13 DIAGNOSIS — J984 Other disorders of lung: Secondary | ICD-10-CM

## 2010-10-24 NOTE — Assessment & Plan Note (Signed)
Summary: consult for pulmonary nodules   Copy to:  Susan Short Primary Provider/Referring Provider:  Aida Short  CC:  Pulmonary Consult for abnormal ct scan.  Pt c/o difficulty breathing with exertion and at rest.  .  History of Present Illness: The pt is a 40y/o female who I have been asked to see for an abnormal ct chest.  The pt has been having refractory chest pain, and underwent a ct chest for evaluation.  She was found to have scattered very small pulmonary nodules bilat, that varied in size from 4-31mm in size.  There was no infiltrate, LN, or other finding to explain her chest pain.  The pt denies any h/o TB exposure, and has had a neg. PPD in past by her history.  She had a mammogram a year ago, and told was "ok".  Her last pap/pelvic was 6-7yrs ago.  The pt did live in [place] for approx 6wks in the past, and did have an illness while there that she thought was a cold.  She also has a long h/o smoking, and continues to smoke 1/2 ppd.  She denies any LE edema, hemoptysis, or any other history to suggest VTE.  She does have chronic doe.  Her left sided chest pain can be worsened by twisting, bending, etc.  She also states that it can be reproducible with palpation.    Preventive Screening-Counseling & Management  Alcohol-Tobacco     Smoking Status: current  Current Medications (verified): 1)  Xanax 2 Mg Tabs (Alprazolam) .... Take 1/2 To 1 Tab By Mouth Four Times A Day As Needed 2)  Ambien Cr 12.5 Mg Cr-Tabs (Zolpidem Tartrate) .... Take 1 Tab By Mouth At Bedtime 3)  Proventil Hfa 108 (90 Base) Mcg/act  Aers (Albuterol Sulfate) .Marland Kitchen.. 1-2 Puffs Every 4-6 Hours As Needed 4)  Elavil 5mg  .... Take 1 Tablet By Mouth Once A Day 5)  Albuterol Sulfate (2.5 Mg/10ml) 0.083% Nebu (Albuterol Sulfate) .... As Needed  Allergies (verified): 1)  ! Ibuprofen 2)  ! Nsaids 3)  ! Toradol  Past History:  Past Medical History:  ALLERGIC RHINITIS (ICD-477.9) DECREASED HEARING (ICD-389.9) NEOPLASM,  BENIGN, THYROID (ICD-226) HYPERLIPIDEMIA (ICD-272.4)    Past Surgical History: neck surgery 2010 sinus surgery 2000 hysterectomy appendectomy Rt shoulder surgery 2 c-sections carpal tunnel release on L hand  Family History: Reviewed history and no changes required. emphysema: maternal aunt asthma: mother heart disease: father (m.i.) cancer: mgf (lung) mgm (lung)   Social History: Reviewed history and no changes required. Patient is a current smoker.  started at age 59.  1/2 ppd. pt is divorced and lives with her mom and children. pt has 2 children. pt is a stay at home mom.  Smoking Status:  current  Review of Systems       The patient complains of shortness of breath with activity, shortness of breath at rest, chest pain, loss of appetite, weight change, headaches, nasal congestion/difficulty breathing through nose, anxiety, and joint stiffness or pain.  The patient denies productive cough, non-productive cough, coughing up blood, irregular heartbeats, acid heartburn, indigestion, abdominal pain, difficulty swallowing, sore throat, tooth/dental problems, sneezing, itching, ear ache, depression, hand/feet swelling, rash, change in color of mucus, and fever.    Vital Signs:  Patient profile:   40 year old female Weight:      121.50 pounds O2 Sat:      99 % on Room air Temp:     98.4 degrees F oral Pulse rate:  97 / minute BP sitting:   116 / 70  (left arm) Cuff size:   regular  Vitals Entered By: Arman Filter LPN (October 12, 1608 9:24 AM)  O2 Flow:  Room air CC: Pulmonary Consult for abnormal ct scan.  Pt c/o difficulty breathing with exertion and at rest.   Comments Medications reviewed with patient Arman Filter LPN  October 13, 9602 9:31 AM    Physical Exam  General:  thin female in nad  Eyes:  PERRLA and EOMI.   Nose:  narrowed bilat Mouth:  no lesions or exudates. Neck:  no jvd, tmg, LN Lungs:  a few rhonchi, no wheezes, good bs bilat. +upper airway  noise.  Heart:  rrr, no mrg Abdomen:  soft and nontender, bs+ Extremities:  no edema or cyanosis  pulses intact distally Neurologic:  alert and oriented, moves all 4.    Impression & Recommendations:  Problem # 1:  PULMONARY NODULE (ICD-518.89)  the pt has multiple small pulmonary nodules that are most likely inflammatory in nature.  She did live in [place] for a period of time, and may represent old histo?  Given her smoking history, these do need f/u at 6mos.  Would also make sure she gets her health maintenance up to date.  My only other thought is whether her prior thyroid nodule could be an issue for her.Marland KitchenMarland Kitchen?f/u u/s to see if it has grown?  Regarding her left sided chest pain, I see no pathology in the chest to explain her symptoms.  Suspect is either chest wall pain or neuropathic pain from thoracic spine disease?  If persists, would consider checking sed rate to see if occult autoimmune disease for completeness.    Medications Added to Medication List This Visit: 1)  Xanax 2 Mg Tabs (Alprazolam) .... Take 1/2 to 1 tab by mouth four times a day as needed 2)  Ambien Cr 12.5 Mg Cr-tabs (Zolpidem tartrate) .... Take 1 tab by mouth at bedtime 3)  Proventil Hfa 108 (90 Base) Mcg/act Aers (Albuterol sulfate) .Marland Kitchen.. 1-2 puffs every 4-6 hours as needed 4)  Elavil 5mg   .... Take 1 tablet by mouth once a day 5)  Albuterol Sulfate (2.5 Mg/55ml) 0.083% Nebu (Albuterol sulfate) .... As needed  Other Orders: Consultation Level IV (54098)  Patient Instructions: 1)  would recommend a followup ct chest in 6mos.  Please call us this summer and can arrange, or can be done thru Dr. Fredirick [place] office 2)  need to get  health maintenance up to date...mammogram, pap, pelvic. 3)  stop smoking

## 2010-11-17 LAB — CBC
HCT: 38.4 % (ref 36.0–46.0)
Hemoglobin: 13.2 g/dL (ref 12.0–15.0)
RBC: 4.07 MIL/uL (ref 3.87–5.11)
WBC: 5.5 10*3/uL (ref 4.0–10.5)

## 2010-11-17 LAB — DIFFERENTIAL
Basophils Relative: 1 % (ref 0–1)
Eosinophils Absolute: 0 10*3/uL (ref 0.0–0.7)
Monocytes Absolute: 0.3 10*3/uL (ref 0.1–1.0)
Monocytes Relative: 6 % (ref 3–12)
Neutro Abs: 3.3 10*3/uL (ref 1.7–7.7)
Neutrophils Relative %: 60 % (ref 43–77)

## 2010-11-17 LAB — BASIC METABOLIC PANEL
Creatinine, Ser: 0.65 mg/dL (ref 0.4–1.2)
Sodium: 140 mEq/L (ref 135–145)

## 2010-12-26 NOTE — Consult Note (Signed)
REASON FOR CONSULTATION:  Consultation for evaluation of chronic  tenosynovitis, right biceps tendon.   CHIEF COMPLAINT:  Right shoulder pain and low back pain.   HISTORY OF PRESENT ILLNESS:  Susan Short is a 40 year old female with  chief complaint as noted above.  She has had shoulder pain for more than  2 years.  Please note that while she is hearing impaired, she does read  lips very well and her mother does assist as well.  Her mother can sign.   She has been seen by orthopedics in the past and prior shoulder  arthroscopy.  Her MRI of her right shoulder demonstrated possible  partial tearing of the subscapularis tendon, otherwise looking okay.  She has had a history of labral repair in the past.  Another note  indicates that her arthroscopic surgeries x2 were in 2005, the patient  indicated 2006.  Her other surgeries were in [facility] per Dr. Sandre Kitty.  She does have some neck pain associated with this.  She has what she  describes as tingling and burning pain in the right arm as well.   In regards to her low back, this has been about 1 month in duration.  She fell while carrying some wood.  On October 07, 2007 had an ER  visit.  Sacral coccygeal x-rays showed no acute fractures.  Looking at  them on the E-Chart today, sacroiliac joint has some degenerative  changes.  there is a partially sacralized left L5 transverse process.  There is some decreased disk at L5-S1 on lateral views.   Pain scores are 9-10/10.  Pain descriptors, sharp, burning, stabbing,  constant, aching.  Interference scores are 10/10.  Sleep is poor.  Pain  is worse with activity as well as inactivity.  Improves with rest.   MEDICATIONS:  Relief from meds is fair.  Has been taking Lyrica 50  b.i.d.  As noted, she has borrowed some medication from a friend October 15, 2007.   ALLERGIES:  TORADOL, IBUPROFEN, CATAFLAM.   FUNCTIONAL STATUS:  She can walk 25 minutes at a time.  She does not  drive.  She checks  that she walks with assistance.  However, she does  not use a cane or a walker or she does not require another person to  hold onto her either.  She is on disability.   REVIEW OF SYSTEMS:  Also positive for numbness, dizziness, anxiety,  nausea, poor appetite.   HABITS:  Include a half pack per day smoker.  Positive marijuana.  She  also borrowed some oxycodone from a friend.  She is divorced.  She lives  with her mother.   FAMILY HISTORY:  Lung disease.   PHYSICAL EXAMINATION:  GENERAL:  Thin female, looking older than her  stated age.  EXTREMITIES:  Without edema.  Normal pulses at the ankles and wrists.  Orientation x3.  Affect is alert.  Gait is normal.  She is able to walk  and heel walk.  She has good coordination.  Deep tendon reflex are  normal on the left side, but decreased right biceps and brachial  radialis.  Motor strength is 5/5 bilaterally, biceps, triceps, grip.  She has some guarding with the right shoulder, so testing is 3+ on the  right and 5 on the left at the deltoid, 5/5 bilateral hip flexors, and  knee extensors, ankle dorsi flexors.  She has tenderness to palpation  over the sacrum area and also the right PSIS area.  IMPRESSION:  1. Chronic postoperative pain, right shoulder.  Only neurologic change      would be reduced, biceps, and brachial radialis reflex could be      related to a C5 radiculopathy versus upper trunk lesion.  This      could have some influence in her overall pain in the right upper      extremity, including the burning sensation.  2. Sacroiliac pain.  History of fall, contusion.  She is only about 2      weeks post.   PLAN:  1. Will start her on some Tramadol.  She may not be a good narcotic      analgesic candidate if she is borrowing medications from others.  I      did inform her that this is illegal and would also be against the      rules of this clinic and result in discharge should she do that      again while under  controlled substance agreement with [place].  Her      Tramadol dose will be 50 t.i.d.  2. She does have some pain in the right upper trap.  I believe that      this is a substitution pattern.  May have some myofascial pain in      that reason.  Will do trigger point injection.  3. Will set her up for EMG, NCV right upper extremities.  Rule out any      brachial plexus versus C5-6 radiculopathy.   Thank you for this interesting consultation.  I will keep you advised of  her progress.  May need to go up on her Lyrica as well.      Susan Short, M.D.  Electronically Signed     AEK/MedQ  D:10/16/2007 17:44:50  T:10/16/2007 23:13:18  Job #:  [id number]   cc:   G. Dorene Grebe, M.D.  Fax: [phone]

## 2010-12-26 NOTE — Assessment & Plan Note (Signed)
A 40 year old female with chronic shoulder pain, chronic tenosynovitis  of the biceps tendon.  She has been on nonnarcotic management due to  nondisclosed methadone as well as THC.  We had her on Soma, but I did  check adult behavioral pharmacy management profile, which revealed  multiple providers prescribing hydrocodone, on one occasion tramadol, on  two occasions oxycodone, although this appeared to be related to a fall  resulting in broken rib.   She describes pain as severe, sharp, burning, stabbing, involving the  total right side of the body, which really does not make much sense  based on her diagnosis.  She can walk.  She climbs steps.  She has some  difficulty with household duties, numbness, tingling, dizziness,  anxiety, nausea.   She is divorced.  Smokes half a pack per day.   PHYSICAL EXAMINATION:  VITAL SIGNS:  Blood pressure 112/54, pulse 75,  respirations 80, O2 saturation 98% room air.  GENERAL:  No acute distress.  Motor strength is 3 minus in the right deltoid and biceps due to pain.  She has 4/5 grip in triceps, 5/5 strength in left side.  She has limited  right shoulder range of motion due to pain.  She has some guarding.   Sensory exam is normal.  Vascular exam is normal in right upper  extremity.   IMPRESSION:  Postoperative pain, history of tenosynovitis, right biceps  tendon.  She has a history of multiple providers in terms narcotic  analgesics, and given that Tresa Garter is metabolized to a benzodiazepine, we  will no longer prescribe this.  We will also cut back on tramadol from 5  times per day to 4 times day given that she has at least on one occasion  gotten additional tramadol from another Teaghan Formica and need to make sure  to keep her below 400 mg per day.   I once again recommended physical therapy.  She states that she cannot  go due to transportation issues.   I spoke with the patient's mother as to whether she can arrange for any  other type of  transportation.  She said she would check into this.   I will see the patient back in 4 months.      Erick Colace, M.D.  Electronically Signed     AEK/MedQ  D:  08/24/2008 17:05:15  T:  08/25/2008 16:10:96  Job #:  [id number]

## 2010-12-26 NOTE — Assessment & Plan Note (Signed)
A 40 year old female with chronic shoulder pain, chronic tenosynovitis  of biceps tendons.  She is complaining of some neck pain.  She states  that she has had no new trauma.  No other new medical problems.  She has  a past history of undisclosed methadone and THC on her urine drug  screens.  She has multiple providers prescribing hydrocodone and  oxycodone.  We took her off Soma.   PHYSICAL EXAMINATION:  GENERAL:  No acute distress.  Mood and affect  appropriate.  VITAL SIGNS:  Her blood pressure is 94/39, pulse 64, respirations 18,  and her O2 sat 97% on room air.  NECK:  She does have some tenderness over the right scalene.  They are  prominent.  She also has some upper trapezius tenderness.  MUSCULOSKELETAL:  She has good strength and grip.  Pain with biceps  flexion and lesser so with triceps.  Deltoid, she has guarding on the  right side due to pain which is longstanding, left side is full  strength.  She has limited right shoulder range of motion due to pain  with guarding.  Sensory exam is normal.  VASCULAR:  Normal.   IMPRESSION:  Chronic postoperative pain, history of tenosynovitis of  right biceps tendon.  We will go ahead and go upon the tramadol to 2  p.o. t.i.d., send her to physical therapy in regards to her neck pain.  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   I will see her back in about 5 months or sooner if she fails to improve.       Erick Colace, M.D.  Electronically Signed     AEK/MedQ  D:  12/14/2008 16:27:41  T:  12/15/2008 06:02:32  Job #:  [id number]

## 2010-12-26 NOTE — Assessment & Plan Note (Signed)
A 40 year old female with chronic right shoulder pain postoperative and  has had multiple shoulder arthroscopies.  She has the diagnosis of  chronic tenosynovitis, biceps tendon.   She has undergone EMG study, November 17, 2007, to rule out any type of  neuropathy affecting right upper extremity.  This was a normal study.  She had an incidental finding of a ulnar hand, which affects the motor  fibers only.   INTERVAL MEDICAL HISTORY:  Negative for any new problems since I last  saw her on November 17, 2007.   She has been getting a fair relief from tramadol 50 mg t.i.d.  She  states, in the past, she has had very good results with Soma as an  antispasmodic.  Her average pain is about 9/10, interferes with activity  at 6/10 level.  Sleep is poor.  Relief from meds is fair.  She can climb  steps.  She does not drive.  She needs some assistance with household  duties, but otherwise independent with all of her self-care.  She has  some numbness, tingling, spasms, dizziness, and anxiety.  She has nausea  and poor appetite.   PHYSICAL EXAMINATION:  She has normal muscle bulk in the upper  extremities.  Her strength is overall about 4/5 in the right deltoid,  biceps, triceps and grip, but some of this is the pain in addition to  the shoulder, reducing full effort, left side is 5/5.  She does have  positive impingement testing, right shoulder at 90 degrees, and  decreased end range, external and internal rotation.  Left side has full  range.  Deep tendon reflexes are normal.   IMPRESSION:  Chronic postoperative pain, history of tenosynovitis, right  biceps tendon, and I suspect a supraspinatus tendonitis.   PLAN:  1. We will continue tramadol, but increase it to 50 mg q.i.d.  2. I will give her some samples of Soma 250 mg.  She can use this      nightly p.r.n., given her #16.  I do not think muscle relaxants in      this situation to be used chronically, but more on bad days.   I will see her  back in about 3 months.      Erick Colace, M.D.  Electronically Signed     AEK/MedQ  D:  02/02/2008 11:58:24  T:  02/03/2008 03:59:11  Job #:  [id number]   cc:   G. Dorene Grebe, M.D.  Fax: [phone]

## 2010-12-26 NOTE — Op Note (Signed)
NAME:  Susan Short, Susan Short             ACCOUNT NO.:  [id number]   MEDICAL RECORD NO.:  [id number]          PATIENT TYPE:  AMB   LOCATION:  SDS                          FACILITY:  [facility]   PHYSICIAN:  Burnard Bunting, M.D.    DATE OF BIRTH:  18-Jan-1971   DATE OF PROCEDURE:  06/03/2007  DATE OF DISCHARGE:                               OPERATIVE REPORT   PREOPERATIVE DIAGNOSIS:  Right shoulder mechanical popping.   POSTOPERATIVE DIAGNOSES:  1. Right shoulder mechanical popping.  2. 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  3. Mild synovitis.  4. Intact rotator cuff.   PROCEDURE:  Right shoulder diagnostic and operative arthroscopy and  extensive labral debridement.   SURGEON:  Burnard Bunting, M.D.   ASSISTANT:  Jerolyn Shin. Tresa Res, M.D.   INDICATIONS:  Susan Short is a 40 year old patient who has undergone  labral repair x2.  She presents now for arthroscopic evaluation of  mechanical symptoms.  MRI scan showed a stable biceps anchor and no  other labral detachment other than the prominent sublabral foramen.   OPERATIVE FINDINGS:  1. Examination under anesthesia.  The patient had excellent stability      with the abducted and externally rotated position to anterior and      posterior subluxation less than 1+.  With her arm by her side, she      had about 2+ anterior and posterior subluxation with about a 1 cm      sulcus sign.  2. Diagnostic arthroscopy:  (1)  Intact rotator cuff, (2) stable      biceps anchor, (3) anterior-superior labral detachment, (4) stable      AIGHL and PIGHL, (5) no HAGL lesion was seen, (6) intact      glenohumeral articular surfaces.   PROCEDURE IN DETAIL:  The patient was brought to the operating room  where general endotracheal anesthesia was induced.  The patient was  placed in the beach-chair position with her head in neutral position.  Right arm, shoulder and hand was prepped with DuraPrep solution and  draped in a sterile manner.   Examination under anesthesia was performed.  Solution of normal saline was injected into the glenohumeral joint.  A  posterior portal was created 2 cm medial and inferior to the margin of  the acromion.  Diagnostic arthroscopy was performed.  The anterior  portal was created under direct visualization.  The rotator cuff was  intact.  The glenohumeral articular surfaces were intact.  AIGHL and  PIGHL were stable and intact.  The patient did have an anterior labral  detachment but a stable biceps anchor.  This was extensively probed.  Mild synovitis was present.  The anterior-superior labrum was  extensively debrided along with the general synovitis in the shoulder.  There was no restriction of range of motion on preoperative examination.  Following the labral debridement, the biceps anchor remained stable.  Some posterior fraying was also debrided.  The portal reversal was  performed and the posterior aspect of the shoulder was inspected and  found to be intact.  Following debridement, it was elected not to  perform biceps tenodesis because of the stability of the biceps anchor.  The biceps tendon itself was pulled into the joint and no areas of  partial tearing were identified.   At this time, instruments were removed from the portals, which were  closed using 3-0 nylon suture.  A bulky dressing was applied.  The  patient tolerated the procedure well without immediate complication.  Dr. Lenny Pastel assistance was required during the case for positioning  of the arm to fully inspect the articular and rotator cuff surfaces.      Burnard Bunting, M.D.  Electronically Signed     GSD/MEDQ  D:  06/03/2007  T:  06/03/2007  Job:  [id number]

## 2010-12-26 NOTE — Procedures (Signed)
NAMEKRISANDRA, BUENO             ACCOUNT NO.:  [id number]   MEDICAL RECORD NO.:  [id number]          PATIENT TYPE:  REC   LOCATION:  TPC                          FACILITY:  [facility]   PHYSICIAN:  Erick Colace, M.D.DATE OF BIRTH:  Aug 03, 1971   DATE OF PROCEDURE:  10/16/2007  DATE OF DISCHARGE:                               OPERATIVE REPORT   PROCEDURE:  Right trapezius trigger point injection.   INDICATIONS:  Right cervical trapezius myofascial pain only partially  responsive to medication management.   Area marked, prepped Betadine, entered with 25-gauge 1-1/2 inch needle,  1.5 mL of 1% lidocaine injected with trigger point deactivation.  The  patient tolerated procedure well.  Post injection instructions given.      Erick Colace, M.D.  Electronically Signed     AEK/MEDQ  D:  10/16/2007 17:45:56  T:  10/17/2007 11:10:06  Job:  [id number]

## 2010-12-26 NOTE — Assessment & Plan Note (Signed)
A 40 year old female with chronic shoulder pain, chronic tenosynovitis  of biceps tendon.  She has chronic right shoulder pain.  She has been on  nonnarcotic management due to nondisclosed methadone as well as THC on  urine drug screen.  She had a negative EMG in the right upper extremity.  Interval medical history is unremarkable.  Her relief with tramadol has  been fair in the past and seems like it is more poor recently.  She  states she fell on the arm.   Her pain is described as sharp, burning, stabbing, constant tingling and  aching.  Sleep is poor.  She will climb steps.  She has depression,  anxiety, numbness, trembling, tingling, and spasm.  Divorced, lives with  her mother.  Smokes half pack per day.   PHYSICAL EXAMINATION:  VITAL SIGNS:  Her blood pressure is 124/85, pulse  70, respiratory rate 18, and O2 saturation is 100% on room air.  GENERAL:  In no acute distress.  Orientation x3.  __________  .  Gait is  normal.  NEUROLOGIC:  Her motor strength is 4/5 right deltoid, biceps, triceps  and grip.  She has some pain limitation in the shoulder reducing her  full effort, 5/5 strength on the left side.  Positive impingement  testing.  She has some crepitus over the biceps tendon on the right  side.   IMPRESSION:  Chronic postoperative pain, history of tenosynovitis right  biceps tendon.   PLAN:  1. She was not tolerant of NSAIDs, so therefore, cannot do that.  I      have offered a steroid injections, however, she declines.  2. We will increase her tramadol to 50 mg up to 5 times per day.  3. Soma 350 nightly.  4. Physical therapy for phonophoresis, gentle mobilization, scapular      mobilization on the right to prevent frozen shoulder.  This can be      done at [facility]      Erick Colace, M.D.  Electronically Signed     AEK/MedQ  D:  04/27/2008 13:50:07  T:  04/28/2008 08:43:23  Job #:  [id number]
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
## 2010-12-29 NOTE — Op Note (Signed)
NAMEANSLEY, Susan Short                       ACCOUNT NO.:  [id number]   MEDICAL RECORD NO.:  [id number]                   PATIENT TYPE:  INP   LOCATION:  5731                                 FACILITY:  [facility]   PHYSICIAN:  Dionne Ano. Everlene Other, M.D.         DATE OF BIRTH:  Aug 06, 1971   DATE OF PROCEDURE:  09/17/2002  DATE OF DISCHARGE:                                 OPERATIVE REPORT   PREOPERATIVE DIAGNOSIS:  Left thumb deep abscess.   POSTOPERATIVE DIAGNOSIS:  Left thumb deep abscess.   PROCEDURE:  I&D left thumb deep abscess.   SURGEON:  Dionne Ano. Amanda Pea, M.D.   ASSISTANT:  None.   COMPLICATIONS:  None.   ANESTHESIA:  General.   DRAINS:  One.   CULTURES:  Aerobic and anaerobic taken.   INDICATIONS FOR PROCEDURE:  The patient is a 40 year old female who three  days ago sustained a crush injury to her thumb with laceration.  She has  developed chronic pain, increased swelling, redness, and a tense volar  aspect of the thumb.  I have discussed with her the diagnosis which is an  abscess about the thumb. I discussed her risks and benefits of surgery  including risk of infection, bleeding, anesthesia, damage to normal  structures, and failure of surgery to accomplish its intended goals of  alleviation of symptoms.  She agreed to proceed.  All questions have been  encouraged and answered preoperatively.   FINDINGS:  This patient had a deep abscess about the volar left thumb pulp.  She underwent I&D and this was performed without difficulty.  We will  continue aggressive approach to eradicate her infection.   DESCRIPTION OF PROCEDURE:  The patient was seen by myself and anesthesia,  taken to the operating room. Having been given general anesthesia under the  direction of Dr. Ivin Booty, the patient was placed in supine position and  prepped and draped in the usual sterile fashion with Betadine scrub and  paint. Once this was done, the patient had the arm elevated, the  tourniquet  was elevated to 250 mmHg and a midline incision was made in the volar pulp  of the thumb. Debridement of skin occurred.  This was nonviable skin. I  removed this skin.  Following, I dissected deep into the fibrous septate and  immediately encountered a foul, greenish, purulent discharge. This was  removed, cultures, and following this, it was fully decompressed. Nonviable  tissue was removed taking care to preserve the neurovascular structures.  Following this, revision was applied.  Once this was done, gauze was placed.  The tourniquet was deflated, hemostasis was excellent with normal capillary  refill to the thumb. The patient had a sterile dressing applied, was  awakened from anesthesia and returned to the recovery room in stable  condition.  Needle,  sponge, and instrument count correct.  She will be admitted for IV  antibiotics, whirlpool, and will advance her to outpatient whirlpool and  p.o. antibiotics in the ensuing 24 to 48 hours.  All questions have been  encouraged and answered.                                               Dionne Ano. Everlene Other, M.D.    Nash Mantis  D:  09/17/2002  T:  09/18/2002  Job:  [id number]

## 2010-12-29 NOTE — H&P (Signed)
NAMELORELAI, HUYSER NO.:  [id number]   MEDICAL RECORD NO.:  [id number]          PATIENT TYPE:  IPS   LOCATION:  0506                          [facility]   PHYSICIAN:  Geoffery Lyons, M.D.      DATE OF BIRTH:  06-12-1971   DATE OF ADMISSION:  01/03/2005  DATE OF DISCHARGE:                         PSYCHIATRIC ADMISSION ASSESSMENT   IDENTIFYING INFORMATION:  A 40 year old divorced white female, voluntarily  admitted on Jan 03, 2005.   HISTORY OF PRESENT ILLNESS:  The patient presents with a history of  depression, increased anxiety, feels very depressed, with suicidal thoughts  for the past 2-3 days.  The patient reports she has no family support.  She  feels like they are ashamed of her due to her being deaf since birth.  She  is unable to get medical care as well, but they will not drive her.  She has  had no food for the past 2 days even though she has asked for help from her  family.  She has little financial income.  She does report some drug abuse,  smoking marijuana.  She states that her friend has offered some cocaine in a  joint.  She states that she did get quite upset.  She had no intentions of  doing any cocaine and states She would never do that.  She has not been  sleeping for the past 4 days.  She denies any psychotic symptoms.  The  patient reports that she has also had some surgery on her jaw but that again  no one would be willing to provide transportation.  The patient is  requesting pain pills at the time for complaints of pain.   PAST PSYCHIATRIC HISTORY:  First admission to [facility], no  history of a suicide attempt or gesture, no outpatient treatment.   SOCIAL HISTORY:  She is a 40 year old divorced white female, has 2 children  ages 46 and 18.  Children are with the father of these children.  She is on  disability.  She lives with her family.   FAMILY HISTORY:  Unclear.   ALCOHOL DRUG HISTORY:  The patient smokes,  denies any alcohol use, smokes  marijuana.   PAST MEDICAL HISTORY:  Primary care Joyanne Eddinger is Dr. Ala Bent.  Medical  problems are urinary tract infection.   MEDICATIONS:  Elavil and Valium, has been on that years ago.   DRUG ALLERGIES:  TORADOL, NSAIDS.   PHYSICAL EXAMINATION:  The patient was assessed at [facility].  Her  vital signs are stable at 98,8, blood pressure is 111/62, 5 feet 3 inches  tall, 121 pounds.  She had a normal EKG.  Urine drug screen was positive for  cocaine.  Platelet count was 447.  Urinalysis showed WBC count of 25-30.  Pregnancy test was negative.  Potassium was 3.3.  The patient is deaf.  She  appears well-nourished, in no acute distress.   MENTAL STATUS EXAM:  She is alert, cooperative female, hard of hearing.  Speech is normal pace and tone.  There is some speech impediment due to her  hearing deficit.  Mood is depressed and hurting.  The patient is  cooperative, flat.  Thought processes are coherent, no evidence of  psychosis, cognitive function intact.  Memory is fair, judgment and insight  is fair.   ADMISSION DIAGNOSES:   AXIS I:  1.  Major depressive disorder.  2.  Cocaine abuse.  3.  Tetrahydrocannabinoid abuse.   AXIS II:  Deferred.   AXIS III:  Urinary tract infection, tobacco abuse and hearing impaired.   AXIS IV:  Problems with primary support group, psychosocial problems, access  to health care services.   AXIS V:  Current is 30, this past year 55-60.   PLAN:  Admission for suicide ideation and substance abuse.  Contract for  safety, work on relapse prevention.  We will initiate Cipro for her urinary  tract infection, encourage fluids.  The patient is also to increase coping  skills.  We will consider a family session with support group.  Will  initiate an antidepressant.  Risks and benefits were discussed.  The patient  is to remain medication compliant and to follow up with her health issues.  Case manager was advised that  the patient may need an interpreter to  participate in groups.   TENTATIVE LENGTH OF CARE:  4-6 days.       JO/MEDQ  D:  01/04/2005  T:  01/04/2005  Job:  [id number]

## 2010-12-29 NOTE — Discharge Summary (Signed)
NAMECHAN, ROSASCO NO.:  [id number]   MEDICAL RECORD NO.:  [id number]                   PATIENT TYPE:  INP   LOCATION:  5529                                 FACILITY:  [facility]   PHYSICIAN:  Madaline Guthrie, MD                   DATE OF BIRTH:  [birth year]   DATE OF ADMISSION:  05/25/2002  DATE OF DISCHARGE:  05/28/2002                                 DISCHARGE SUMMARY   DISCHARGE DIAGNOSES:  1. Community-acquired pneumonia.  2. Nausea and vomiting.  3. Hypokalemia.  4. Headache.  5. Right otalgia.  6. Deafness since age [age].   DISCHARGE MEDICATIONS:  1. Climara patch 0.025 mg one patch every week.  2. Levaquin 250 mg one p.o. q.d. x10 days.  3. Tylenol No. 3, 1-2 tablets p.o. q.4 h. p.r.n.  4. Phenergan 12.5 mg suppositories, one suppository p.r.n. q.4-6 h.   DISCHARGE INSTRUCTIONS:  She is to take the Tylenol No. 3 for extreme  headaches. She is instructed not to take regular Tylenol with the Tylenol  No. 3. She is also instructed that if she is taking medication solely for  fever and not headache. She is to take regular Tylenol.   ACTIVITY:  She is to return to work next Tuesday, and a work note has been  given. She has made a followup appointment at the [facility] outpatient  clinic with Dr. Jacklynn Bue on 06/04/02, at 3 o'clock. She is also instructed that  if she becomes more short of breath or have increased chest pain or fever  she is to call her primary MD or call us at the outpatient clinic. She is  also instructed to take her full seven days of antibiotics and to stop  smoking while she is sick.   HISTORY OF PRESENT ILLNESS:  The patient is a 40 year old white female who  presented to [facility] ED with chief complaint of right ear pain starting  one week prior, fever and cough x3 days, fevers, chills, rigors, sore  throat, headache, nausea and vomiting x1 day. She was also complaining of  left-sided chest pain under her left breast that  was worse with coughing and  was relieved with rest. It did not radiate but was tender to palpation. She  was also complaining of increased fatigue the past three days and reported a  history of having pneumonia seven years prior. She was short of breath on  admission that was definitely worse with exertion. In her social history,  she was a smoker, smoking half pack per day for the last 20 years and  admitted to the use of marijuana; last use was three days prior to  admission.   PHYSICAL EXAMINATION:  VITAL SIGNS:  Temperature 104.2, pulse 121,  respirations 20, and SaO2 of 84% on room air.  GENERAL EXAM:  A thin female appearing older than stated age who was  edentulous.  HEENT:  Eyes slightly sunken, dark circles. Pupils are equal, round, and  reactive to light and accommodation.  Right tympanic membrane was without  fluid or erythema. She had patent external auditory canals. Hearing aids  present bilaterally, and a good cone of light present bilaterally. She had  no rhinorrhea present, was extremely tender to the right jaw, right TM, and  mastoid process with no ecchymosis noted. Frontal sinuses were tender to  palpation. Throat was without erythema or exudate.  LUNGS:  She had bibasilar crackles, more so on the left side than right,  with decreased breath sounds on the right. She was seen short of breath  lying supine.  BREAST:  A breast exam was performed revealing no masses and no axillary  lymphadenopathy.  CARDIOVASCULAR:  She was tachycardic; regular, rate, and rhythm without  murmurs.  GASTROINTESTINAL:  She was diffusely tender to palpation with no rebound  tenderness and positive bowel sounds.  NECK:  She had cervical and submandibular lymphadenopathy present on the  right side, and it was very tender to palpation. There was no increased  warmth or redness to this area.  NEUROLOGICAL:  The patient was sleepy status post being given Dilaudid in  the ED. She was  neurologically intact with no focal deficits. She was  notably hearing impaired.   ADMISSION LABORATORY DATA:  Sodium 136, potassium 3.2, chloride 99, bicarb  25, BUN 7, creatinine 0.8, glucose 137, anion gap of 12. White blood cell  count 14.4, hemoglobin 15.2, platelet count 218 with an absolute neutrophil  count of 12.8. She was group A Strep negative, HIV nonreactive. She had a  positive urine drug screen, testing positive for THC, opioids, and cocaine.  Her original UA done in the ED was negative. ABGs were performed on day of  admission showing a pH of 7.471, pCO2 of 36.1, a pO2 of 68, a bicarb of 26,  O2 saturation of 94%; this was done on room air.   HOSPITAL COURSE:  Problem #1:  Community-acquired pneumonia revealed on  chest x-ray on day of admission showing pneumonia of the lingula and left  lower lobe with prominence of the left hilus. For this, she was empirically  started on ceftriaxone and azithromycin to cover for Strep Pneumo and  atypicals. She received three days of this antibiotic course and finally  defervesced on hospital day #3, going from a T-max of 104.6 to 100.5. She  was also placed on O2 per nasal cannula 2-3 L for O2 saturations in the 80s  on room air at admission. She did have crackles on lung exam with egophony  present at the bilateral bases which had improved also by her day of  discharge. She had good air movement and her oxygen saturation had improved  to 94% on room air at discharge. The pruritic pain that was on the left side  most likely was associated with the pneumonia. For this, she was given  morphine sulfate 4-5 mg q.3-4 h. IV during her hospital stay. It was noted  that she used full dosages at every scheduled time for her pain control and  continued to complain of pain. Her dosage was decreased to 3 mg when she had  a blood pressure drop with the systolic pressure at 92. Her cough also improved. She was given Tussionex for this. On hospital  day #3, her sputum  culture grew out gram-negative rods, and she was switched from ceftriaxone  and azithromycin to IV cefuroxime 1 g  IV q.8 h. She noticeably started to  feel better and will continue her antibiotic at home, as she was switched to  Levaquin, for the next seven days.   Problem #2:  Bronchospasm present at admission.  It was noted that she did  have some slight wheezes present in the lung fields, and she was given  albuterol and Atrovent nebulizers. This also helped to clear her secretions  and cough better. At discharge, she does not have any more wheezing and so  was discontinued on her albuterol and Atrovent. The bronchospasm was likely  secondary to her bilateral infiltrates. She was also advised to stop  smoking.   Problem #3:  Headache. The patient continued to complain of a frontal  headache during her hospital stay. She was ruled out for sinusitis with a CT  done on 10/14/3,  which was negative. She continued to complain of  headaches, even through morphine at her maximum dosages. Likely there is  some psychogenic component to her headaches or regular tension headaches.  Given her urine drug screen, she may be a narcotic seeker. She is only given  20 Tylenol No. 3 at discharge.   Problem #4:  Right ear pain and jaw pain. For this, she was given cold  packs, and she had a CT done on 05/27/02, to rule out mastoiditis. CT was  negative. Her ear exam has been negative throughout her hospital stay. She  does not have otitis media clinically, and the patient states that she does  have a followup appointment with her ENT in [facility] with her history of  being deaf and wearing hearing aids.   Problem #5:  Positive urine drug screen.  Since she did test positive for  cocaine which she had not admitted to, she did test positive for opioids  which may have been drawn after she was given Dilaudid in the ED, and she  also tested positive for [facility] which she had admitted to  using. The patient  stated that she used marijuana three days prior to admission that was laced  with cocaine. She had denied any IV drug use. Because she did test positive  for cocaine use and she did have an initial presentation of chest pain, she  was ruled out for acute MI or ischemia. An EKG was done, showing normal  sinus rhythm, although she was in sinus tachycardia on telemetry for at  least 24 hours. She did not show any ectopic beats or signs of ischemia.  Cardiac enzymes were negative x3. She did receive a social work consult to  check about substance abuse, and her social situation should be worked up as  an outpatient.   Problem #6:  Nausea and vomiting.  The patient was given Phenergan during  her hospital stay. The most amount of emesis that she had was on her  admission day when she had 5-6 large emeses which were nonbloody and  nonbilious. By her discharge day, she is tolerating p.o., still having occasional nausea and so is discharged home with Phenergan 12.5 mg  suppositories to use p.r.n. She did receive IV fluid rehydration with normal  saline x3 days.   Problem #7:  Hypokalemia. On admission BMP, her potassium was 3.2. She was  repleted orally with K-Dur and improved to 3.9 on day of discharge. The  hypokalemia is likely secondary to a contraction alkalosis secondary to  vomiting.   LABORATORY DATA:  Pertinent labs include a sputum culture which grew out  negative rods and Candida. The Candida notably is likely secondary to her  broad-spectrum antibiotic use. Her cultures and sensitivity are still  pending at this time and will be followed up as an outpatient. Blood  cultures x4 days have been negative. Urine cultures are essentially negative  with less than 3,000 colonies counted.   Discharge labs include a BMP showing a sodium of 135, potassium 3.9,  chloride 105, bicarb 25, BUN 5, creatinine 0.6, glucose 106. A white blood  cell count 4.2, hemoglobin 10.6,  hematocrit 30.8, platelet count 194.   FOLLOWUP:  Pertinent items to follow up include her sputum cultures and  sensitivity as she was sent home on Levaquin. A followup BMP should be done  since she was hypokalemic on admission. She was not sent home on any  potassium supplements. Her hemoglobin also notable dropped two points during  her admission stay. She is not showing any signs of active bleeding. Also,  we should follow up on her headaches as the etiology of these headaches are  unknown. Also follow up on her substance abuse and her social situation. A  followup chest x-ray is advised after she has completed her course of  antibiotics for pneumonia and a one week __________ time is completed.     Lorne Skeens, D.O.                         Madaline Guthrie, MD    KL/MEDQ  D:  05/28/2002  T:  05/30/2002  Job:  [id number]   cc:   [facility] Cipro   Outpatient clinic

## 2010-12-29 NOTE — Discharge Summary (Signed)
NAMESOMALIA, SEGLER NO.:  [id number]   MEDICAL RECORD NO.:  [id number]          PATIENT TYPE:  IPS   LOCATION:  0507                          FACILITY[facility]  [facility]   PHYSICIAN:  Geoffery Lyons, M.D.      DATE OF BIRTH:  11/20/1970   DATE OF ADMISSION:  01/03/2005  DATE OF DISCHARGE:  01/08/2005                                 DISCHARGE SUMMARY   CHIEF COMPLAINT AND PRESENT ILLNESS:  This was the first admission to [facility] for this 40 year old white female, divorced,  voluntarily admitted.  History of depression, increased anxiety, feeling  very depressed, suicidal thoughts for the past two or three days prior to  this admission.  She has no family support.  Felt like they were ashamed of  her due to her being deaf since birth.  Unable to get medical care as well.  They would not drive her.  She has not had, as she claimed, any food for the  past two days.  Little financial income.  Does report some drug abuse,  smoking marijuana.  Claimed that her friend has offered some cocaine in a  joint.  Claimed that she got quite upset.  Not sleeping for the past four  days.  Had some surgery on her jaw.   PAST PSYCHIATRIC HISTORY:  First time at [facility].  No other  previous treatment.   ALCOHOL/DRUG HISTORY:  Smokes.  Denies any alcohol use.  Uses marijuana.   PAST MEDICAL HISTORY:  Urinary tract infection.   MEDICATIONS:  Elavil, Valium.  Apparently, she has TMJ and might have gone  through surgery.   PHYSICAL EXAMINATION:  Performed and failed to show any acute findings.   LABORATORY DATA:  TSH 0.389.  EKG within normal limits.  Urine drug screen  was positive for cocaine.  Urinalysis showed white blood cells 25-30.  Pregnancy was negative.  Blood chemistry was within normal limits.   MENTAL STATUS EXAM:  Alert, cooperative female, hard of hearing.  Speech was  normal in pace and tone.  Some speech impediment due to her hearing deficit.  Mood was depressed and hurting.  She is cooperative.  Somewhat constricted.  Thought processes are logical, coherent and relevant.  No evidence of  psychosis.  Cognition well-preserved.   ADMISSION DIAGNOSES:   AXIS I:  1.  Major depressive disorder.  2.  Cocaine and marijuana abuse.   AXIS II:  No diagnosis.   AXIS III:  1.  Urinary tract infection.  2.  Hearing impaired.   AXIS IV:  Moderate.   AXIS V:  Global Assessment of Functioning upon admission 30; highest Global  Assessment of Functioning in the last year 60.   HOSPITAL COURSE:  She was admitted.  She was started in individual and group  psychotherapy.  She was given Darvocet.  She was treated with Septra.  Darvocet was continued.  She was placed on Ultram.  Septra was discontinued  and she was placed on Cipro and given some Pyridium and she was given some  Vicodin for her pain.  She did endorse increased signs  and symptoms of  depression, dealing with a lot of sadness, decreased energy, decreased  motivation, ashamed of being deaf.  Claimed that she was having a very hard  time.  Also endorsed pain in her bladder.  Having a hard time dealing with  her situation from day to day.  Endorsed feeling overwhelmed, panicky.  Had  used Zoloft and Paxil as well as Lexapro with a series of side effects.  Also Wellbutrin that caused some shakiness and Effexor that caused  withdrawal.  She was started on Cymbalta 20 mg per day.  By May 26th, most  of her concerns were pain management.  Endorsed pain, TMJ, status post  surgery with pain referred to her head, was spreading on her bladder,  burning.  Felt she could not function due to the pain.  She was given  Percocet 10/500 mg, 2 every four hours as needed.  We obtained a  consultation from pharmacy for pain management.  She was finally placed on  Percocet 2 tabs twice a day.  She definitely settled down.  She was less  anxious.  Her pain was under better control.  On May 29th,  she was in full  contact with reality.  There were no suicidal ideation, no homicidal  ideation, no hallucinations, no delusions.  Still worried about the pain and  she was going to pursue outpatient treatment but she denied having any  suicidal ideation.  Still wanting to be kept on something for her nerves and  her pain and she was willing to follow up on an outpatient basis.   DISCHARGE DIAGNOSES:   AXIS I:  1.  Major depressive disorder.  2.  Cocaine and marijuana abuse.   AXIS II:  No diagnosis.   AXIS III:  1.  Deaf.  2.  Urinary tract infection.   AXIS IV:  Moderate.   AXIS V:  Global Assessment of Functioning upon discharge 55-60.   DISCHARGE MEDICATIONS:  1.  Cymbalta 30 mg per day.  2.  Valium 10 mg, 1 twice a day.  3.  Monistat vaginal cream.  4.  Percocet 5/325 mg, 2 every 12 hours as needed for pain.  5.  Vistaril 25 mg, 1 every six hours as needed for anxiety.  6.  Soma 1 four times a day as needed.  7.  Ultram 500 mg, 1-2 every six hours as needed for breakthrough pain.   FOLLOW UP:  [facility] and [facility].       IL/MEDQ  D:  02/05/2005  T:  02/05/2005  Job:  [id number]

## 2011-05-10 LAB — POCT I-STAT, CHEM 8
BUN: 9
Chloride: 106
Sodium: 140
TCO2: 26

## 2011-05-10 LAB — RAPID URINE DRUG SCREEN, HOSP PERFORMED
Amphetamines: NOT DETECTED
Barbiturates: NOT DETECTED
Cocaine: NOT DETECTED

## 2011-05-11 LAB — DIFFERENTIAL
Basophils Relative: 1
Eosinophils Absolute: 0
Eosinophils Relative: 0
Eosinophils Relative: 0
Lymphocytes Relative: 51 — ABNORMAL HIGH
Lymphs Abs: 2.7
Monocytes Absolute: 0.4
Monocytes Relative: 6
Neutro Abs: 2.2

## 2011-05-11 LAB — CBC
HCT: 36.5
HCT: 40.4
HCT: 36.2
Hemoglobin: 12.4
Hemoglobin: 12.4
MCHC: 34.1
MCV: 93.8
MCV: 91.7
Platelets: 318
Platelets: 257
RBC: 3.89
RDW: 16.8 — ABNORMAL HIGH
WBC: 5.3
WBC: 5.2

## 2011-05-11 LAB — URINALYSIS, ROUTINE W REFLEX MICROSCOPIC
Glucose, UA: NEGATIVE
Protein, ur: NEGATIVE
pH: 6

## 2011-05-11 LAB — POCT I-STAT, CHEM 8
BUN: 7
BUN: 9
BUN: 10
Calcium, Ion: 1.12
Calcium, Ion: 1.1 — ABNORMAL LOW
Chloride: 103
Chloride: 105
Creatinine, Ser: 0.9
Glucose, Bld: 92
Glucose, Bld: 79
Potassium: 4.2
Potassium: 4.5
Sodium: 138
TCO2: 31
TCO2: 24

## 2011-05-23 LAB — CBC
HCT: 44.7
MCV: 99.2
Platelets: 382
RDW: 14.3 — ABNORMAL HIGH
WBC: 7.7

## 2012-01-03 ENCOUNTER — Encounter (HOSPITAL_COMMUNITY): Payer: Self-pay

## 2012-01-03 ENCOUNTER — Emergency Department (HOSPITAL_COMMUNITY)
Admission: EM | Admit: 2012-01-03 | Discharge: 2012-01-04 | Disposition: A | Discharge status: Home / Self Care | Payer: Medicaid Other | Attending: Emergency Medicine | Admitting: Emergency Medicine

## 2012-01-03 DIAGNOSIS — F1123 Opioid dependence with withdrawal: Secondary | ICD-10-CM

## 2012-01-03 DIAGNOSIS — F411 Generalized anxiety disorder: Secondary | ICD-10-CM | POA: Insufficient documentation

## 2012-01-03 DIAGNOSIS — F1193 Opioid use, unspecified with withdrawal: Secondary | ICD-10-CM

## 2012-01-03 DIAGNOSIS — F17200 Nicotine dependence, unspecified, uncomplicated: Secondary | ICD-10-CM | POA: Insufficient documentation

## 2012-01-03 DIAGNOSIS — F19939 Other psychoactive substance use, unspecified with withdrawal, unspecified: Secondary | ICD-10-CM | POA: Insufficient documentation

## 2012-01-03 DIAGNOSIS — R45851 Suicidal ideations: Secondary | ICD-10-CM | POA: Insufficient documentation

## 2012-01-03 DIAGNOSIS — F419 Anxiety disorder, unspecified: Secondary | ICD-10-CM

## 2012-01-03 DIAGNOSIS — F1920 Other psychoactive substance dependence, uncomplicated: Secondary | ICD-10-CM | POA: Insufficient documentation

## 2012-01-03 DIAGNOSIS — G8929 Other chronic pain: Secondary | ICD-10-CM

## 2012-01-03 HISTORY — DX: Panic disorder (episodic paroxysmal anxiety): F41.0

## 2012-01-03 HISTORY — DX: Unspecified hearing loss, unspecified ear: H91.90

## 2012-01-03 LAB — CBC
HCT: 41.3 % (ref 36.0–46.0)
MCHC: 34.6 g/dL (ref 30.0–36.0)
Platelets: 237 10*3/uL (ref 150–400)
RDW: 12.7 % (ref 11.5–15.5)
WBC: 7.3 10*3/uL (ref 4.0–10.5)

## 2012-01-03 LAB — COMPREHENSIVE METABOLIC PANEL
ALT: 6 U/L (ref 0–35)
AST: 11 U/L (ref 0–37)
Albumin: 4.7 g/dL (ref 3.5–5.2)
Alkaline Phosphatase: 68 U/L (ref 39–117)
BUN: 8 mg/dL (ref 6–23)
Chloride: 100 mEq/L (ref 96–112)
Potassium: 2.7 mEq/L — CL (ref 3.5–5.1)
Sodium: 138 mEq/L (ref 135–145)
Total Bilirubin: 0.5 mg/dL (ref 0.3–1.2)
Total Protein: 7.4 g/dL (ref 6.0–8.3)

## 2012-01-03 LAB — RAPID URINE DRUG SCREEN, HOSP PERFORMED
Amphetamines: NOT DETECTED
Barbiturates: NOT DETECTED
Benzodiazepines: NOT DETECTED
Cocaine: NOT DETECTED
Tetrahydrocannabinol: POSITIVE — AB

## 2012-01-03 LAB — ETHANOL: Alcohol, Ethyl (B): <11 mg/dL (ref 0–11)

## 2012-01-03 LAB — POCT PREGNANCY, URINE: Preg Test, Ur: NEGATIVE

## 2012-01-03 MED ORDER — ACETAMINOPHEN 325 MG PO TABS
650.0000 mg | ORAL_TABLET | ORAL | Status: DC | PRN
Start: 2012-01-03 — End: 2012-01-04
  Administered 2012-01-03: 650 mg via ORAL
  Filled 2012-01-03: qty 2

## 2012-01-03 MED ORDER — POTASSIUM CHLORIDE CRYS ER 20 MEQ PO TBCR: 40.0000 meq | EXTENDED_RELEASE_TABLET | Freq: Two times a day (BID) | ORAL | Status: DC

## 2012-01-03 MED ORDER — ALUM & MAG HYDROXIDE-SIMETH 200-200-20 MG/5ML PO SUSP: 30.0000 mL | ORAL | Status: DC | PRN

## 2012-01-03 MED ORDER — LOPERAMIDE HCL 2 MG PO CAPS: 2.0000 mg | ORAL_CAPSULE | ORAL | Status: DC | PRN

## 2012-01-03 MED ORDER — POTASSIUM CHLORIDE CRYS ER 20 MEQ PO TBCR
40.0000 meq | EXTENDED_RELEASE_TABLET | Freq: Once | ORAL | Status: AC
  Administered 2012-01-03: 40 meq via ORAL
  Filled 2012-01-03: qty 2

## 2012-01-03 MED ORDER — ZOLPIDEM TARTRATE 5 MG PO TABS
10.0000 mg | ORAL_TABLET | Freq: Every evening | ORAL | Status: DC | PRN
  Administered 2012-01-03: 10 mg via ORAL
  Filled 2012-01-03 (×2): qty 1

## 2012-01-03 MED ORDER — CLONIDINE HCL 0.1 MG PO TABS: 0.1000 mg | ORAL_TABLET | ORAL | Status: DC

## 2012-01-03 MED ORDER — CLONIDINE HCL 0.1 MG PO TABS
0.1000 mg | ORAL_TABLET | Freq: Four times a day (QID) | ORAL | Status: DC
  Administered 2012-01-03: 0.1 mg via ORAL
  Filled 2012-01-03: qty 1

## 2012-01-03 MED ORDER — LORAZEPAM 1 MG PO TABS
1.0000 mg | ORAL_TABLET | Freq: Three times a day (TID) | ORAL | Status: DC | PRN
  Administered 2012-01-03 – 2012-01-04 (×3): 1 mg via ORAL
  Filled 2012-01-03 (×5): qty 1

## 2012-01-03 MED ORDER — NICOTINE 21 MG/24HR TD PT24
21.0000 mg | MEDICATED_PATCH | Freq: Every day | TRANSDERMAL | Status: DC
  Administered 2012-01-03: 21 mg via TRANSDERMAL
  Filled 2012-01-03: qty 1

## 2012-01-03 MED ORDER — ONDANSETRON HCL 4 MG PO TABS: 4.0000 mg | ORAL_TABLET | Freq: Three times a day (TID) | ORAL | Status: DC | PRN

## 2012-01-03 MED ORDER — METHOCARBAMOL 500 MG PO TABS
500.0000 mg | ORAL_TABLET | Freq: Three times a day (TID) | ORAL | Status: DC | PRN
  Administered 2012-01-03 – 2012-01-04 (×2): 500 mg via ORAL
  Filled 2012-01-03 (×2): qty 1

## 2012-01-03 MED ORDER — CLONIDINE HCL 0.1 MG PO TABS: 0.1000 mg | ORAL_TABLET | Freq: Every day | ORAL | Status: DC

## 2012-01-03 MED ORDER — DICYCLOMINE HCL 20 MG PO TABS
20.0000 mg | ORAL_TABLET | Freq: Four times a day (QID) | ORAL | Status: DC | PRN
  Administered 2012-01-03 – 2012-01-04 (×2): 20 mg via ORAL
  Filled 2012-01-03 (×2): qty 1

## 2012-01-03 MED ORDER — HYDROXYZINE HCL 25 MG PO TABS
25.0000 mg | ORAL_TABLET | Freq: Four times a day (QID) | ORAL | Status: DC | PRN
  Administered 2012-01-03 – 2012-01-04 (×2): 25 mg via ORAL
  Filled 2012-01-03 (×2): qty 1

## 2012-01-03 MED ORDER — IBUPROFEN 200 MG PO TABS: 600.0000 mg | ORAL_TABLET | Freq: Three times a day (TID) | ORAL | Status: DC | PRN

## 2012-01-03 MED ORDER — ONDANSETRON 4 MG PO TBDP: 4.0000 mg | ORAL_TABLET | Freq: Four times a day (QID) | ORAL | Status: DC | PRN

## 2012-01-03 NOTE — ED Notes (Signed)
Patient placed in paper scrubs and checked by security. Patient Belongings 1 bag in locker 3.

## 2012-01-03 NOTE — ED Notes (Signed)
In addition, patient also states that she is suicidal, but does not have a plan. Patient states she has tried to cut herself in the past.

## 2012-01-03 NOTE — ED Notes (Signed)
Pt takes xanax, percocet daily. Pt has been out of meds x 3 days. Pt is pacing in waiting room. Pt brought in by ems. Pt does have suicidal thoughts and past hx of cutting arms.

## 2012-01-03 NOTE — ED Notes (Signed)
Patient reports that she has been having panic attacks x 4 days and getting progressively worse. Patient is constantly rocking back and forth in the chair.

## 2012-01-03 NOTE — ED Provider Notes (Signed)
History     CSN: [id number]  Arrival date & time 01/03/12  1730   First MD Initiated Contact with Patient 01/03/12 1838      Chief Complaint  Patient presents with  . Anxiety  . Suicidal    (Consider location/radiation/quality/duration/timing/severity/associated sxs/prior treatment) HPI  Patient states she has a history of anxiety and anxiety attacks. She states she came here 2 days ago to visit her mother after her mother had surgery. She states she was staying with her friend in [place] and she left her medicine in [place]. She states no one will take her back home to [facility] so she can get her meds. She also makes the comment that she cannot get any more medications for mental health for another 2 weeks. She denies nausea, vomiting, or diarrhea. She has diffuse abdominal pains and chest pains and states she hurts all over. She states she feels suicidal and states she's cut herself in the past and she has many scars on each arm. Patient states "I feel like him going to die".  [place] Dr. Clarene [place] in [place] Psychiatrist Dr. Cheree Ditto at mental health  Past Medical History  Diagnosis Date  . Panic attack   . Hard of hearing     Past Surgical History  Procedure Date  . Shoulder surgery   . Abdominal hysterectomy     No family history on file.  History  Substance Use Topics  . Smoking status: Current Everyday Smoker -- 0.5 packs/day  . Smokeless tobacco: Never Used  . Alcohol Use: No  lives in [place] and stays with friend in [place] Denies drug use On disabilty for hearing loss  OB History    Grav Para Term Preterm Abortions TAB SAB Ect Mult Living                  Review of Systems  All other systems reviewed and are negative.    Allergies  Ketorolac tromethamine; Nsaids; and Ibuprofen  Home Medications  No current outpatient prescriptions on file.  BP 126/63  Pulse 79  Temp(Src) 98.5 F (36.9 C) (Oral)  Resp 20  SpO2 100%  Vital signs normal     Physical Exam  Nursing note and vitals reviewed. Constitutional: She is oriented to person, place, and time. She appears well-developed and well-nourished.  Non-toxic appearance. She does not appear ill. She appears distressed.       Patient rocking back and forth  HENT:  Head: Normocephalic and atraumatic.  Right Ear: External ear normal.  Left Ear: External ear normal.  Nose: Nose normal. No mucosal edema or rhinorrhea.  Mouth/Throat: Oropharynx is clear and moist and mucous membranes are normal. No dental abscesses or uvula swelling.  Eyes: Conjunctivae and EOM are normal. Pupils are equal, round, and reactive to light.  Neck: Normal range of motion and full passive range of motion without pain. Neck supple.  Cardiovascular: Normal rate, regular rhythm and normal heart sounds.  Exam reveals no gallop and no friction rub.   No murmur heard. Pulmonary/Chest: Effort normal and breath sounds normal. No respiratory distress. She has no wheezes. She has no rhonchi. She has no rales. She exhibits no tenderness and no crepitus.  Abdominal: Soft. Normal appearance and bowel sounds are normal. She exhibits no distension. There is no tenderness. There is no rebound and no guarding.  Musculoskeletal: Normal range of motion. She exhibits no edema and no tenderness.       Moves all extremities well.   Neurological:  She is alert and oriented to person, place, and time. She has normal strength. No cranial nerve deficit.       Patient is very hard of hearing asking questions to be repeated  Skin: Skin is warm, dry and intact. No rash noted. No erythema. No pallor.       Patient has well-healed linear scar on each forearm on the volar aspect where she states she's cut herself in the past. They are in the midportion of the forearm  Psychiatric: Her speech is normal and behavior is normal. Her mood appears not anxious.       Anxious    ED Course  Procedures (including critical care time)  [place] controlled substance site reviewed. Patient received 120 oxycodone 15 mg tablets on May 8. She gets them monthly from Dr. Clarene Duke, her dose was increased from 10 mg in March she also takes 120-150 alprazolam 1 mg tablets a month last prescribed on May 2  Patient states she left her medicine in [place] because she was just going to visit her mother for one day. It does not make sense to me that she would leave her medication at home when she is  going to be gone all day. She also states she can't get any more medication for another 2 weeks. At this point at this point I think patient is either taking more of her medications then she should or she has lost control of them.  Waiting for ACT to evaluate and place patient.    Results for orders placed during the hospital encounter of 01/03/12  CBC      Component Value Range   WBC 7.3  4.0 - 10.5 (K/uL)   RBC 4.55  3.87 - 5.11 (MIL/uL)   Hemoglobin 14.3  12.0 - 15.0 (g/dL)   HCT 56.2  13.0 - 86.5 (%)   MCV 90.8  78.0 - 100.0 (fL)   MCH 31.4  26.0 - 34.0 (pg)   MCHC 34.6  30.0 - 36.0 (g/dL)   RDW 78.4  69.6 - 29.5 (%)   Platelets 237  150 - 400 (K/uL)  COMPREHENSIVE METABOLIC PANEL      Component Value Range   Sodium 138  135 - 145 (mEq/L)   Potassium 2.7 (*) 3.5 - 5.1 (mEq/L)   Chloride 100  96 - 112 (mEq/L)   CO2 21  19 - 32 (mEq/L)   Glucose, Bld 126 (*) 70 - 99 (mg/dL)   BUN 8  6 - 23 (mg/dL)   Creatinine, Ser 2.84  0.50 - 1.10 (mg/dL)   Calcium 9.9  8.4 - 13.2 (mg/dL)   Total Protein 7.4  6.0 - 8.3 (g/dL)   Albumin 4.7  3.5 - 5.2 (g/dL)   AST 11  0 - 37 (U/L)   ALT 6  0 - 35 (U/L)   Alkaline Phosphatase 68  39 - 117 (U/L)   Total Bilirubin 0.5  0.3 - 1.2 (mg/dL)   GFR calc non Af Amer >90  >90 (mL/min)   GFR calc Af Amer >90  >90 (mL/min)  ETHANOL      Component Value Range   Alcohol, Ethyl (B) <11  0 - 11 (mg/dL)  ACETAMINOPHEN LEVEL      Component Value Range   Acetaminophen (Tylenol), Serum <15.0  10 - 30 (ug/mL)   URINE RAPID DRUG SCREEN (HOSP PERFORMED)      Component Value Range   Opiates NONE DETECTED  NONE DETECTED    Cocaine  NONE DETECTED  NONE DETECTED    Benzodiazepines NONE DETECTED  NONE DETECTED    Amphetamines NONE DETECTED  NONE DETECTED    Tetrahydrocannabinol POSITIVE (*) NONE DETECTED    Barbiturates NONE DETECTED  NONE DETECTED   POCT PREGNANCY, URINE      Component Value Range   Preg Test, Ur NEGATIVE  NEGATIVE    Laboratory interpretation all normal except hypokalemia    No results found.   1. Anxiety   2. Narcotic withdrawal   3. Suicidal ideation   4. Chronic pain    Disposition pending  Devoria Albe, MD, FACEP    MDM          Ward Givens, MD 01/04/12 [phone]

## 2012-01-04 LAB — POTASSIUM: Potassium: 3.6 mEq/L (ref 3.5–5.1)

## 2012-01-04 MED ORDER — LORAZEPAM 1 MG PO TABS: 1.0000 mg | ORAL_TABLET | Freq: Three times a day (TID) | ORAL | Status: AC | PRN

## 2012-01-04 MED ORDER — LORAZEPAM 1 MG PO TABS
1.0000 mg | ORAL_TABLET | Freq: Once | ORAL | Status: AC
  Administered 2012-01-04: 1 mg via ORAL

## 2012-01-04 MED ORDER — ALBUTEROL SULFATE HFA 108 (90 BASE) MCG/ACT IN AERS
INHALATION_SPRAY | RESPIRATORY_TRACT | Status: AC
  Administered 2012-01-04: 2 via RESPIRATORY_TRACT
  Filled 2012-01-04: qty 6.7

## 2012-01-04 MED ORDER — OXYCODONE-ACETAMINOPHEN 5-325 MG PO TABS
1.0000 | ORAL_TABLET | Freq: Once | ORAL | Status: AC
  Administered 2012-01-04: 1 via ORAL
  Filled 2012-01-04: qty 1

## 2012-01-04 MED ORDER — LORAZEPAM 1 MG PO TABS
ORAL_TABLET | ORAL | Status: AC
  Administered 2012-01-04: 1 mg via ORAL
  Filled 2012-01-04: qty 1

## 2012-01-04 MED ORDER — ALBUTEROL SULFATE HFA 108 (90 BASE) MCG/ACT IN AERS
2.0000 | INHALATION_SPRAY | RESPIRATORY_TRACT | Status: DC | PRN
  Administered 2012-01-04: 2 via RESPIRATORY_TRACT

## 2012-01-04 NOTE — BH Assessment (Signed)
Assessment Note   Susan Short is an 41 y.o. female.   Patient is a 41 year old white female.  Patient was brought into the Emergency Room by the EMS.  Patient reports that she is suicidal.  Patient states "I feel like him going to die".  Patient reports that she does not have a plan to hurt herself.   Patient reports a history of cutting herself in the past.  Patient has visible scars on each of her arms.   Patient states that she has a history of anxiety and anxiety attacks.  Patient reports that she has been having panic attacks for the past 4 days and the attacks have been getting progressively worse. Patient reports taking Xanax and Percocet daily to decrease symptoms of her anxiety. Patient states that she has been out of medication for 3 days. Patient reports that she left her medication in [place] at her friend's home.  Patient has a history of psychiatric hospitalizations. Patient was not able to remember the names of the hospitals or the dates in which she was hospitalized.  Patient reports that she has a therapist and a psychiatrist that prescribes medication for her in [place]. Patient denies any psychosis.  Patient denies any HI.  Patient denies any past substance abuse issues.  However, patient tested positive for marijuana.     Axis I: Anxiety Disorder NOS Axis II: Deferred Axis III:  Past Medical History  Diagnosis Date  . Panic attack   . Hard of hearing    Axis IV: economic problems, educational problems, occupational problems, other psychosocial or environmental problems, problems related to social environment, problems with access to health care services and problems with primary support group Axis V: 31-40 impairment in reality testing  Past Medical History:  Past Medical History  Diagnosis Date  . Panic attack   . Hard of hearing     Past Surgical History  Procedure Date  . Shoulder surgery   . Abdominal hysterectomy     Family History: No family history on  file.  Social History:  reports that she has been smoking.  She has never used smokeless tobacco. She reports that she does not drink alcohol or use illicit drugs.  Additional Social History:     CIWA: CIWA-Ar BP: 111/74 mmHg Pulse Rate: 79  COWS:    Allergies:  Allergies  Allergen Reactions  . Ketorolac Tromethamine   . Nsaids   . Ibuprofen Rash    Home Medications:  (Not in a hospital admission)  OB/GYN Status:  No LMP recorded. Patient has had a hysterectomy.  General Assessment Data Location of Assessment: [facility] Assessment Services ACT Assessment: Yes Living Arrangements: Alone Can pt return to current living arrangement?: Yes Admission Status: Voluntary Is patient capable of signing voluntary admission?: Yes Transfer from: Other (Comment) Referral Source: Self/Family/Friend     Risk to self Suicidal Ideation: Yes-Currently Present Suicidal Intent: No-Not Currently/Within Last 6 Months Is patient at risk for suicide?: Yes Suicidal Plan?: No-Not Currently/Within Last 6 Months Access to Means: No What has been your use of drugs/alcohol within the last 12 months?: yes Previous Attempts/Gestures: No How many times?: 0  Other Self Harm Risks: no Triggers for Past Attempts: Unpredictable Intentional Self Injurious Behavior: Cutting Comment - Self Injurious Behavior: cutting on her arms Family Suicide History: Unknown Recent stressful life event(s): Financial Problems;Trauma (Comment) Persecutory voices/beliefs?: No Depression: Yes Depression Symptoms: Insomnia;Fatigue;Guilt;Loss of interest in usual pleasures;Feeling worthless/self pity Substance abuse history and/or treatment for substance  abuse?: No Suicide prevention information given to non-admitted patients: Yes  Risk to Others Homicidal Ideation: No Thoughts of Harm to Others: No Current Homicidal Intent: No Current Homicidal Plan: No Access to Homicidal Means: No Identified Victim: none  reported History of harm to others?: No Assessment of Violence: None Noted Violent Behavior Description: none reported Does patient have access to weapons?: No Criminal Charges Pending?: No Does patient have a court date: No  Psychosis Hallucinations: None noted Delusions: None noted  Mental Status Report Appear/Hygiene: Disheveled Eye Contact: Poor Motor Activity: Agitation;Restlessness;Unsteady Speech: Logical/coherent Level of Consciousness: Alert;Quiet/awake;Restless;Other (Comment) (legs were shaking) Mood: Anxious;Irritable;Worthless, low self-esteem Affect: Anxious;Sullen Anxiety Level: Moderate Thought Processes: Coherent;Relevant Judgement: Unimpaired Orientation: Person;Place;Time;Situation Obsessive Compulsive Thoughts/Behaviors: None  Cognitive Functioning Concentration: Decreased Memory: Recent Intact;Remote Intact IQ: Average Insight: Fair Impulse Control: Poor Appetite: Fair Weight Loss: 0  Weight Gain: 0  Sleep: Decreased Total Hours of Sleep: 4  Vegetative Symptoms: None  ADLScreening [facility] Assessment Services) Patient's cognitive ability adequate to safely complete daily activities?: Yes Patient able to express need for assistance with ADLs?: Yes Independently performs ADLs?: Yes  Abuse/Neglect [facility]) Physical Abuse: Denies Verbal Abuse: Denies Sexual Abuse: Denies  Prior Inpatient Therapy Prior Inpatient Therapy: Yes Prior Therapy Dates: unk Prior Therapy Facilty/Provider(s): [facility] Reason for Treatment: medication management  Prior Outpatient Therapy Prior Outpatient Therapy: Yes Prior Therapy Dates: presently Prior Therapy Facilty/Provider(s): unable to rememner Reason for Treatment: medication management and therapy ]  ADL Screening (condition at time of admission) Patient's cognitive ability adequate to safely complete daily activities?: Yes Patient able to express need for assistance with ADLs?: Yes Independently performs ADLs?:  Yes       Abuse/Neglect Assessment (Assessment to be complete while patient is alone) Physical Abuse: Denies Verbal Abuse: Denies Sexual Abuse: Denies Values / Beliefs Cultural Requests During Hospitalization: None Spiritual Requests During Hospitalization: None        Additional Information 1:1 In Past 12 Months?: No CIRT Risk: No Elopement Risk: No Does patient have medical clearance?: Yes     Disposition: Pending placement at [facility].  Disposition Disposition of Patient: Inpatient treatment program Type of inpatient treatment program: Adult [facility])  On Site Evaluation by:   Reviewed with Physician:     Phillip Heal LaVerne 01/04/2012 2:46 AM

## 2012-01-04 NOTE — ED Notes (Signed)
Looked in pt's belongings for a piece of paper with a telephone number on it. Pt said the paper was in the pocket of her green shorts. The number was found but there was no answer. The paper was put back in the pocket of her green shorts where it was found.

## 2012-01-04 NOTE — Discharge Instructions (Signed)
Anxiety and Panic Attacks Your caregiver has informed you that you are having an anxiety or panic attack. There may be many forms of this. Most of the time these attacks come suddenly and without warning. They come at any time of day, including periods of sleep, and at any time of life. They may be strong and unexplained. Although panic attacks are very scary, they are physically harmless. Sometimes the cause of your anxiety is not known. Anxiety is a protective mechanism of the body in its fight or flight mechanism. Most of these perceived danger situations are actually nonphysical situations (such as anxiety over losing a job). CAUSES  The causes of an anxiety or panic attack are many. Panic attacks may occur in otherwise healthy people given a certain set of circumstances. There may be a genetic cause for panic attacks. Some medications may also have anxiety as a side effect. SYMPTOMS  Some of the most common feelings are:  Intense terror.   Dizziness, feeling faint.   Hot and cold flashes.   Fear of going crazy.   Feelings that nothing is real.   Sweating.   Shaking.   Chest pain or a fast heartbeat (palpitations).   Smothering, choking sensations.   Feelings of impending doom and that death is near.   Tingling of extremities, this may be from over-breathing.   Altered reality (derealization).   Being detached from yourself (depersonalization).  Several symptoms can be present to make up anxiety or panic attacks. DIAGNOSIS  The evaluation by your caregiver will depend on the type of symptoms you are experiencing. The diagnosis of anxiety or panic attack is made when no physical illness can be determined to be a cause of the symptoms. TREATMENT  Treatment to prevent anxiety and panic attacks may include:  Avoidance of circumstances that cause anxiety.   Reassurance and relaxation.   Regular exercise.   Relaxation therapies, such as yoga.   Psychotherapy with a  psychiatrist or therapist.   Avoidance of caffeine, alcohol and illegal drugs.   Prescribed medication.  SEEK IMMEDIATE MEDICAL CARE IF:   You experience panic attack symptoms that are different than your usual symptoms.   You have any worsening or concerning symptoms.  Document Released: 07/30/2005 Document Revised: 07/19/2011 Document Reviewed: 12/01/2009 Exit[employer] Patient Information 2012 [employer]. Anxiety and Panic Attacks Your caregiver has informed you that you are having an anxiety or panic attack. There may be many forms of this. Most of the time these attacks come suddenly and without warning. They come at any time of day, including periods of sleep, and at any time of life. They may be strong and unexplained. Although panic attacks are very scary, they are physically harmless. Sometimes the cause of your anxiety is not known. Anxiety is a protective mechanism of the body in its fight or flight mechanism. Most of these perceived danger situations are actually nonphysical situations (such as anxiety over losing a job). CAUSES  The causes of an anxiety or panic attack are many. Panic attacks may occur in otherwise healthy people given a certain set of circumstances. There may be a genetic cause for panic attacks. Some medications may also have anxiety as a side effect. SYMPTOMS  Some of the most common feelings are:  Intense terror.   Dizziness, feeling faint.   Hot and cold flashes.   Fear of going crazy.   Feelings that nothing is real.   Sweating.   Shaking.   Chest pain or a fast   heartbeat (palpitations).   Smothering, choking sensations.   Feelings of impending doom and that death is near.   Tingling of extremities, this may be from over-breathing.   Altered reality (derealization).   Being detached from yourself (depersonalization).  Several symptoms can be present to make up anxiety or panic attacks. DIAGNOSIS  The evaluation by your caregiver will  depend on the type of symptoms you are experiencing. The diagnosis of anxiety or panic attack is made when no physical illness can be determined to be a cause of the symptoms. TREATMENT  Treatment to prevent anxiety and panic attacks may include:  Avoidance of circumstances that cause anxiety.   Reassurance and relaxation.   Regular exercise.   Relaxation therapies, such as yoga.   Psychotherapy with a psychiatrist or therapist.   Avoidance of caffeine, alcohol and illegal drugs.   Prescribed medication.  SEEK IMMEDIATE MEDICAL CARE IF:   You experience panic attack symptoms that are different than your usual symptoms.   You have any worsening or concerning symptoms.  Document Released: 07/30/2005 Document Revised: 07/19/2011 Document Reviewed: 12/01/2009 Exit[employer] Patient Information 2012 [employer]. 

## 2012-01-04 NOTE — ED Provider Notes (Signed)
Pt denies SI or HI.  She states that she just needs her medications.  Pt is asking for a refill of her methadone and xanax.  Pt does not require inpatient treatment for this.  Will give pt rx of her benzo.  Vital signs stable.  Potassium has been corrected.  Pt stable for discharge.  Celene Kras, MD 01/04/12 [phone]

## 2012-01-04 NOTE — Progress Notes (Signed)
Pt has asked for multiple medications all shift.  Pt has called out at least 20 times for ativan.  Pt has had all the medication due at this point until 0530.  Informed pt that at 0530 she can have next prn doses.  Will continue to monitor pt.  Barnett Hatter P

## 2012-01-04 NOTE — ED Notes (Signed)
Pt family called for ride home no one is available.Will talk to [facility]

## 2012-08-27 ENCOUNTER — Other Ambulatory Visit: Payer: Self-pay | Admitting: Internal Medicine

## 2012-08-27 DIAGNOSIS — Z1231 Encounter for screening mammogram for malignant neoplasm of breast: Secondary | ICD-10-CM

## 2012-09-18 ENCOUNTER — Ambulatory Visit: Payer: Medicaid Other

## 2013-01-14 ENCOUNTER — Other Ambulatory Visit: Payer: Self-pay | Admitting: Orthopedic Surgery

## 2013-01-29 ENCOUNTER — Encounter (HOSPITAL_COMMUNITY): Admission: RE | Admit: 2013-01-29 | Payer: Medicaid Other | Source: Ambulatory Visit

## 2013-02-03 ENCOUNTER — Inpatient Hospital Stay: Admit: 2013-02-03 | Payer: Medicaid Other | Admitting: Orthopedic Surgery

## 2013-02-03 SURGERY — OPEN REDUCTION INTERNAL FIXATION (ORIF) CLAVICULAR FRACTURE
Anesthesia: General | Site: Shoulder | Laterality: Right

## 2013-04-23 ENCOUNTER — Other Ambulatory Visit: Payer: Self-pay | Admitting: Orthopedic Surgery

## 2013-05-02 ENCOUNTER — Encounter (HOSPITAL_COMMUNITY): Payer: Self-pay | Admitting: *Deleted

## 2013-05-04 ENCOUNTER — Encounter (HOSPITAL_COMMUNITY): Payer: Self-pay | Admitting: Vascular Surgery

## 2013-05-04 MED ORDER — CEFAZOLIN SODIUM-DEXTROSE 2-3 GM-% IV SOLR
2.0000 g | INTRAVENOUS | Status: DC
  Filled 2013-05-04: qty 50

## 2013-05-05 ENCOUNTER — Inpatient Hospital Stay (HOSPITAL_COMMUNITY): Payer: Medicaid Other | Admitting: Vascular Surgery

## 2013-05-05 ENCOUNTER — Encounter (HOSPITAL_COMMUNITY): Payer: Self-pay | Admitting: Pharmacy Technician

## 2013-05-05 ENCOUNTER — Encounter (HOSPITAL_COMMUNITY): Payer: Self-pay | Admitting: Vascular Surgery

## 2013-05-05 ENCOUNTER — Encounter (HOSPITAL_COMMUNITY)
Admission: RE | Disposition: A | Discharge status: Home / Self Care | Payer: Self-pay | Source: Ambulatory Visit | Attending: Orthopedic Surgery

## 2013-05-05 ENCOUNTER — Observation Stay (HOSPITAL_COMMUNITY)
Admission: RE | Admit: 2013-05-05 | Discharge: 2013-05-07 | Disposition: A | Discharge status: Home / Self Care | Payer: Medicaid Other | Source: Ambulatory Visit | Attending: Orthopedic Surgery | Admitting: Orthopedic Surgery

## 2013-05-05 ENCOUNTER — Inpatient Hospital Stay (HOSPITAL_COMMUNITY): Payer: Medicaid Other

## 2013-05-05 ENCOUNTER — Encounter (HOSPITAL_COMMUNITY): Payer: Self-pay | Admitting: *Deleted

## 2013-05-05 DIAGNOSIS — F410 Panic disorder [episodic paroxysmal anxiety] without agoraphobia: Secondary | ICD-10-CM | POA: Insufficient documentation

## 2013-05-05 DIAGNOSIS — F17200 Nicotine dependence, unspecified, uncomplicated: Secondary | ICD-10-CM | POA: Insufficient documentation

## 2013-05-05 DIAGNOSIS — IMO0002 Reserved for concepts with insufficient information to code with codable children: Principal | ICD-10-CM | POA: Insufficient documentation

## 2013-05-05 DIAGNOSIS — Z79899 Other long term (current) drug therapy: Secondary | ICD-10-CM | POA: Insufficient documentation

## 2013-05-05 DIAGNOSIS — S42001K Fracture of unspecified part of right clavicle, subsequent encounter for fracture with nonunion: Secondary | ICD-10-CM

## 2013-05-05 DIAGNOSIS — K219 Gastro-esophageal reflux disease without esophagitis: Secondary | ICD-10-CM | POA: Insufficient documentation

## 2013-05-05 DIAGNOSIS — H9190 Unspecified hearing loss, unspecified ear: Secondary | ICD-10-CM | POA: Insufficient documentation

## 2013-05-05 DIAGNOSIS — Z23 Encounter for immunization: Secondary | ICD-10-CM | POA: Insufficient documentation

## 2013-05-05 DIAGNOSIS — J45909 Unspecified asthma, uncomplicated: Secondary | ICD-10-CM | POA: Insufficient documentation

## 2013-05-05 HISTORY — DX: Unspecified hearing loss, unspecified ear: H91.90

## 2013-05-05 HISTORY — DX: Unspecified asthma, uncomplicated: J45.909

## 2013-05-05 HISTORY — DX: Failed or difficult intubation, initial encounter: T88.4XXA

## 2013-05-05 HISTORY — PX: ORIF CLAVICULAR FRACTURE: SHX5055

## 2013-05-05 HISTORY — DX: Gastro-esophageal reflux disease without esophagitis: K21.9

## 2013-05-05 LAB — BASIC METABOLIC PANEL
BUN: 11 mg/dL (ref 6–23)
CO2: 28 mEq/L (ref 19–32)
Calcium: 9.7 mg/dL (ref 8.4–10.5)
Creatinine, Ser: 1.1 mg/dL (ref 0.50–1.10)
GFR calc non Af Amer: 61 mL/min — ABNORMAL LOW (ref >90)

## 2013-05-05 LAB — SURGICAL PCR SCREEN
MRSA, PCR: POSITIVE — AB
Staphylococcus aureus: POSITIVE — AB

## 2013-05-05 LAB — CBC
HCT: 39.9 % (ref 36.0–46.0)
MCHC: 33.8 g/dL (ref 30.0–36.0)
Platelets: 282 10*3/uL (ref 150–400)
RDW: 14 % (ref 11.5–15.5)
WBC: 8.3 10*3/uL (ref 4.0–10.5)

## 2013-05-05 SURGERY — OPEN REDUCTION INTERNAL FIXATION (ORIF) CLAVICULAR FRACTURE
Anesthesia: General | Site: Shoulder | Laterality: Right | Wound class: Clean

## 2013-05-05 MED ORDER — BUPIVACAINE HCL (PF) 0.5 % IJ SOLN
INTRAMUSCULAR | Status: AC
  Filled 2013-05-05: qty 20

## 2013-05-05 MED ORDER — CLONIDINE HCL (ANALGESIA) 100 MCG/ML EP SOLN
150.0000 ug | Freq: Once | EPIDURAL | Status: AC
  Administered 2013-05-05: 150 ug via INTRA_ARTICULAR
  Filled 2013-05-05: qty 1.5

## 2013-05-05 MED ORDER — HYDROMORPHONE 0.3 MG/ML IV SOLN
INTRAVENOUS | Status: AC
  Filled 2013-05-05: qty 25

## 2013-05-05 MED ORDER — LACTATED RINGERS IV SOLN
INTRAVENOUS | Status: DC | PRN
  Administered 2013-05-05 (×2): via INTRAVENOUS

## 2013-05-05 MED ORDER — CHLORHEXIDINE GLUCONATE 4 % EX LIQD: 60.0000 mL | Freq: Once | CUTANEOUS | Status: DC

## 2013-05-05 MED ORDER — DIPHENHYDRAMINE HCL 50 MG/ML IJ SOLN: 12.5000 mg | Freq: Four times a day (QID) | INTRAMUSCULAR | Status: DC | PRN

## 2013-05-05 MED ORDER — WARFARIN - PHARMACIST DOSING INPATIENT: Freq: Every day | Status: DC

## 2013-05-05 MED ORDER — FENTANYL CITRATE 0.05 MG/ML IJ SOLN
INTRAMUSCULAR | Status: DC | PRN
  Administered 2013-05-05 (×3): 50 ug via INTRAVENOUS

## 2013-05-05 MED ORDER — SUCCINYLCHOLINE CHLORIDE 20 MG/ML IJ SOLN
INTRAMUSCULAR | Status: DC | PRN
  Administered 2013-05-05: 140 mg via INTRAVENOUS

## 2013-05-05 MED ORDER — DIPHENHYDRAMINE HCL 12.5 MG/5ML PO ELIX: 12.5000 mg | ORAL_SOLUTION | Freq: Four times a day (QID) | ORAL | Status: DC | PRN

## 2013-05-05 MED ORDER — ONDANSETRON HCL 4 MG/2ML IJ SOLN
INTRAMUSCULAR | Status: DC | PRN
  Administered 2013-05-05: 4 mg via INTRAVENOUS

## 2013-05-05 MED ORDER — ONDANSETRON HCL 4 MG/2ML IJ SOLN: 4.0000 mg | Freq: Four times a day (QID) | INTRAMUSCULAR | Status: DC | PRN

## 2013-05-05 MED ORDER — METOCLOPRAMIDE HCL 10 MG PO TABS: 5.0000 mg | ORAL_TABLET | Freq: Three times a day (TID) | ORAL | Status: DC | PRN

## 2013-05-05 MED ORDER — VANCOMYCIN HCL IN DEXTROSE 1-5 GM/200ML-% IV SOLN
1000.0000 mg | Freq: Two times a day (BID) | INTRAVENOUS | Status: AC
  Administered 2013-05-05: 1000 mg via INTRAVENOUS
  Filled 2013-05-05: qty 200

## 2013-05-05 MED ORDER — PROPOFOL 10 MG/ML IV BOLUS: INTRAVENOUS | Status: DC | PRN

## 2013-05-05 MED ORDER — HYDROMORPHONE HCL PF 1 MG/ML IJ SOLN
INTRAMUSCULAR | Status: AC
  Filled 2013-05-05: qty 1

## 2013-05-05 MED ORDER — MIDAZOLAM HCL 2 MG/2ML IJ SOLN
INTRAMUSCULAR | Status: AC
  Filled 2013-05-05: qty 2

## 2013-05-05 MED ORDER — ONDANSETRON HCL 4 MG/2ML IJ SOLN: 4.0000 mg | Freq: Once | INTRAMUSCULAR | Status: DC | PRN

## 2013-05-05 MED ORDER — POTASSIUM CHLORIDE IN NACL 20-0.9 MEQ/L-% IV SOLN
INTRAVENOUS | Status: DC
  Administered 2013-05-05: 21:00:00 via INTRAVENOUS
  Filled 2013-05-05 (×2): qty 1000

## 2013-05-05 MED ORDER — PHENYLEPHRINE HCL 10 MG/ML IJ SOLN
INTRAMUSCULAR | Status: DC | PRN
  Administered 2013-05-05 (×6): 40 ug via INTRAVENOUS

## 2013-05-05 MED ORDER — ROCURONIUM BROMIDE 100 MG/10ML IV SOLN
INTRAVENOUS | Status: DC | PRN
  Administered 2013-05-05: 50 mg via INTRAVENOUS

## 2013-05-05 MED ORDER — ACETAMINOPHEN 325 MG PO TABS
650.0000 mg | ORAL_TABLET | Freq: Four times a day (QID) | ORAL | Status: DC | PRN
  Administered 2013-05-05 – 2013-05-06 (×2): 650 mg via ORAL
  Filled 2013-05-05 (×2): qty 2

## 2013-05-05 MED ORDER — OXYCODONE HCL 5 MG PO TABS: 5.0000 mg | ORAL_TABLET | Freq: Once | ORAL | Status: DC | PRN

## 2013-05-05 MED ORDER — NEOSTIGMINE METHYLSULFATE 1 MG/ML IJ SOLN
INTRAMUSCULAR | Status: DC | PRN
  Administered 2013-05-05: 4 mg via INTRAVENOUS

## 2013-05-05 MED ORDER — HYDROMORPHONE HCL PF 1 MG/ML IJ SOLN: 0.5000 mg | INTRAMUSCULAR | Status: DC | PRN

## 2013-05-05 MED ORDER — ONDANSETRON HCL 4 MG PO TABS: 4.0000 mg | ORAL_TABLET | Freq: Four times a day (QID) | ORAL | Status: DC | PRN

## 2013-05-05 MED ORDER — ZOLPIDEM TARTRATE 5 MG PO TABS: 10.0000 mg | ORAL_TABLET | Freq: Every day | ORAL | Status: DC

## 2013-05-05 MED ORDER — ALPRAZOLAM 0.5 MG PO TABS
1.0000 mg | ORAL_TABLET | Freq: Three times a day (TID) | ORAL | Status: DC
  Administered 2013-05-05 – 2013-05-07 (×6): 1 mg via ORAL
  Filled 2013-05-05 (×6): qty 2

## 2013-05-05 MED ORDER — ACETAMINOPHEN 650 MG RE SUPP: 650.0000 mg | Freq: Four times a day (QID) | RECTAL | Status: DC | PRN

## 2013-05-05 MED ORDER — PROPOFOL 10 MG/ML IV BOLUS
INTRAVENOUS | Status: DC | PRN
  Administered 2013-05-05: 130 mg via INTRAVENOUS

## 2013-05-05 MED ORDER — NALOXONE HCL 0.4 MG/ML IJ SOLN: 0.4000 mg | INTRAMUSCULAR | Status: DC | PRN

## 2013-05-05 MED ORDER — VANCOMYCIN HCL IN DEXTROSE 1-5 GM/200ML-% IV SOLN
INTRAVENOUS | Status: AC
  Administered 2013-05-05: 1000 mg via INTRAVENOUS
  Filled 2013-05-05: qty 200

## 2013-05-05 MED ORDER — FENTANYL CITRATE 0.05 MG/ML IJ SOLN
INTRAMUSCULAR | Status: AC
  Administered 2013-05-05: 100 ug
  Filled 2013-05-05: qty 2

## 2013-05-05 MED ORDER — MENTHOL 3 MG MT LOZG: 1.0000 | LOZENGE | OROMUCOSAL | Status: DC | PRN

## 2013-05-05 MED ORDER — OXYCODONE HCL 5 MG PO TABS
15.0000 mg | ORAL_TABLET | Freq: Once | ORAL | Status: DC
  Filled 2013-05-05: qty 3

## 2013-05-05 MED ORDER — MEPERIDINE HCL 25 MG/ML IJ SOLN: 6.2500 mg | INTRAMUSCULAR | Status: DC | PRN

## 2013-05-05 MED ORDER — COUMADIN BOOK
Freq: Once | Status: AC
  Administered 2013-05-06: 01:00:00
  Filled 2013-05-05: qty 1

## 2013-05-05 MED ORDER — WARFARIN VIDEO: Freq: Once | Status: DC

## 2013-05-05 MED ORDER — SODIUM CHLORIDE 0.9 % IJ SOLN: 9.0000 mL | INTRAMUSCULAR | Status: DC | PRN

## 2013-05-05 MED ORDER — 0.9 % SODIUM CHLORIDE (POUR BTL) OPTIME
TOPICAL | Status: DC | PRN
  Administered 2013-05-05 (×2): 1000 mL

## 2013-05-05 MED ORDER — OXYCODONE HCL 5 MG/5ML PO SOLN: 5.0000 mg | Freq: Once | ORAL | Status: DC | PRN

## 2013-05-05 MED ORDER — GLYCOPYRROLATE 0.2 MG/ML IJ SOLN
INTRAMUSCULAR | Status: DC | PRN
  Administered 2013-05-05: 0.6 mg via INTRAVENOUS

## 2013-05-05 MED ORDER — FENTANYL CITRATE 0.05 MG/ML IJ SOLN
INTRAMUSCULAR | Status: AC
Start: 2013-05-05 — End: 2013-05-05
  Administered 2013-05-05: 50 ug
  Filled 2013-05-05: qty 2

## 2013-05-05 MED ORDER — AMITRIPTYLINE HCL 50 MG PO TABS
250.0000 mg | ORAL_TABLET | Freq: Every day | ORAL | Status: DC
  Administered 2013-05-06 (×2): 250 mg via ORAL
  Filled 2013-05-05 (×3): qty 1

## 2013-05-05 MED ORDER — METHOCARBAMOL 500 MG PO TABS: 500.0000 mg | ORAL_TABLET | Freq: Four times a day (QID) | ORAL | Status: DC | PRN

## 2013-05-05 MED ORDER — METHOCARBAMOL 100 MG/ML IJ SOLN
500.0000 mg | Freq: Four times a day (QID) | INTRAVENOUS | Status: DC | PRN
  Administered 2013-05-05: 500 mg via INTRAVENOUS
  Filled 2013-05-05: qty 5

## 2013-05-05 MED ORDER — MORPHINE SULFATE 4 MG/ML IJ SOLN
INTRAMUSCULAR | Status: DC | PRN
  Administered 2013-05-05: 8 mg

## 2013-05-05 MED ORDER — LIDOCAINE HCL (CARDIAC) 20 MG/ML IV SOLN
INTRAVENOUS | Status: DC | PRN
  Administered 2013-05-05: 100 mg via INTRAVENOUS

## 2013-05-05 MED ORDER — INFLUENZA VAC SPLIT QUAD 0.5 ML IM SUSP
0.5000 mL | INTRAMUSCULAR | Status: AC
  Administered 2013-05-06: 0.5 mL via INTRAMUSCULAR
  Filled 2013-05-05: qty 0.5

## 2013-05-05 MED ORDER — PANTOPRAZOLE SODIUM 40 MG PO TBEC
40.0000 mg | DELAYED_RELEASE_TABLET | Freq: Every day | ORAL | Status: DC
  Administered 2013-05-06 – 2013-05-07 (×2): 40 mg via ORAL
  Filled 2013-05-05 (×2): qty 1

## 2013-05-05 MED ORDER — ARTIFICIAL TEARS OP OINT
TOPICAL_OINTMENT | OPHTHALMIC | Status: DC | PRN
  Administered 2013-05-05: 1 via OPHTHALMIC

## 2013-05-05 MED ORDER — WARFARIN SODIUM 7.5 MG PO TABS
7.5000 mg | ORAL_TABLET | Freq: Once | ORAL | Status: AC
  Administered 2013-05-05: 7.5 mg via ORAL
  Filled 2013-05-05: qty 1

## 2013-05-05 MED ORDER — ZOLPIDEM TARTRATE 5 MG PO TABS
5.0000 mg | ORAL_TABLET | Freq: Every day | ORAL | Status: DC
  Administered 2013-05-05 – 2013-05-06 (×2): 5 mg via ORAL
  Filled 2013-05-05 (×2): qty 1

## 2013-05-05 MED ORDER — DEXTROSE 5 % IV SOLN
10.0000 mg | INTRAVENOUS | Status: DC | PRN
  Administered 2013-05-05: 10 ug/min via INTRAVENOUS

## 2013-05-05 MED ORDER — MUPIROCIN 2 % EX OINT
TOPICAL_OINTMENT | Freq: Two times a day (BID) | CUTANEOUS | Status: DC
  Administered 2013-05-05 – 2013-05-06 (×3): via NASAL
  Administered 2013-05-06: 1 via NASAL
  Filled 2013-05-05 (×2): qty 22

## 2013-05-05 MED ORDER — HYDROMORPHONE 0.3 MG/ML IV SOLN
INTRAVENOUS | Status: DC
  Administered 2013-05-05: 0.2 mg via INTRAVENOUS

## 2013-05-05 MED ORDER — DIAZEPAM 5 MG PO TABS
5.0000 mg | ORAL_TABLET | Freq: Four times a day (QID) | ORAL | Status: DC | PRN
  Administered 2013-05-06 – 2013-05-07 (×3): 5 mg via ORAL
  Filled 2013-05-05 (×3): qty 1

## 2013-05-05 MED ORDER — ALBUTEROL SULFATE HFA 108 (90 BASE) MCG/ACT IN AERS
2.0000 | INHALATION_SPRAY | Freq: Four times a day (QID) | RESPIRATORY_TRACT | Status: DC | PRN
  Filled 2013-05-05: qty 6.7

## 2013-05-05 MED ORDER — MORPHINE SULFATE 4 MG/ML IJ SOLN
INTRAMUSCULAR | Status: AC
  Filled 2013-05-05: qty 2

## 2013-05-05 MED ORDER — METOCLOPRAMIDE HCL 5 MG/ML IJ SOLN: 5.0000 mg | Freq: Three times a day (TID) | INTRAMUSCULAR | Status: DC | PRN

## 2013-05-05 MED ORDER — BUPIVACAINE HCL (PF) 0.5 % IJ SOLN
INTRAMUSCULAR | Status: DC | PRN
  Administered 2013-05-05: 20 mL

## 2013-05-05 MED ORDER — MIDAZOLAM HCL 2 MG/2ML IJ SOLN
INTRAMUSCULAR | Status: AC
  Administered 2013-05-05: 2 mg
  Filled 2013-05-05: qty 2

## 2013-05-05 MED ORDER — LACTATED RINGERS IV SOLN
INTRAVENOUS | Status: DC
  Administered 2013-05-05: 10:00:00 via INTRAVENOUS

## 2013-05-05 MED ORDER — PHENOL 1.4 % MT LIQD: 1.0000 | OROMUCOSAL | Status: DC | PRN

## 2013-05-05 MED ORDER — HYDROMORPHONE HCL PF 1 MG/ML IJ SOLN
0.5000 mg | INTRAMUSCULAR | Status: DC | PRN
  Administered 2013-05-05 – 2013-05-07 (×6): 1 mg via INTRAVENOUS
  Filled 2013-05-05 (×6): qty 1

## 2013-05-05 MED ORDER — MORPHINE SULFATE (PF) 1 MG/ML IV SOLN
INTRAVENOUS | Status: AC
  Filled 2013-05-05: qty 25

## 2013-05-05 MED ORDER — OXYCODONE HCL 5 MG PO TABS
5.0000 mg | ORAL_TABLET | ORAL | Status: DC | PRN
Start: 2013-05-06 — End: 2013-05-07
  Administered 2013-05-05 – 2013-05-07 (×7): 10 mg via ORAL
  Filled 2013-05-05 (×7): qty 2

## 2013-05-05 MED ORDER — MUPIROCIN 2 % EX OINT
TOPICAL_OINTMENT | CUTANEOUS | Status: AC
  Filled 2013-05-05: qty 22

## 2013-05-05 MED ORDER — HYDROMORPHONE HCL PF 1 MG/ML IJ SOLN
0.2500 mg | INTRAMUSCULAR | Status: DC | PRN
  Administered 2013-05-05 (×4): 0.5 mg via INTRAVENOUS

## 2013-05-05 MED ORDER — MORPHINE SULFATE (PF) 1 MG/ML IV SOLN
INTRAVENOUS | Status: DC
  Administered 2013-05-05: 23:00:00 via INTRAVENOUS
  Administered 2013-05-06: 7.73 mg via INTRAVENOUS
  Administered 2013-05-06: 15 mg via INTRAVENOUS

## 2013-05-05 SURGICAL SUPPLY — 80 items
APL SKNCLS STERI-STRIP NONHPOA (GAUZE/BANDAGES/DRESSINGS) ×1
BENZOIN TINCTURE PRP APPL 2/3 (GAUZE/BANDAGES/DRESSINGS) ×2 IMPLANT
BIT DRILL 2 CANN GRADUATED (BIT) ×1 IMPLANT
BIT DRILL 2.5 CANN ENDOSCOPIC (BIT) ×1 IMPLANT
BIT DRILL 3.5 CANN STRL (BIT) ×1 IMPLANT
BIT DRILL CANNULATED AC REPAIR (INSTRUMENTS) IMPLANT
BONE CHIP PRESERV 20CC (Bone Implant) ×1 IMPLANT
BUTTON DOGBONE (Orthopedic Implant) ×1 IMPLANT
CLOTH BEACON ORANGE TIMEOUT ST (SAFETY) ×2 IMPLANT
CLSR STERI-STRIP ANTIMIC 1/2X4 (GAUZE/BANDAGES/DRESSINGS) ×1 IMPLANT
COVER SURGICAL LIGHT HANDLE (MISCELLANEOUS) ×2 IMPLANT
DRAIN PENROSE 1/2X12 LTX STRL (WOUND CARE) IMPLANT
DRAPE C-ARM 42X72 X-RAY (DRAPES) IMPLANT
DRAPE INCISE IOBAN 66X45 STRL (DRAPES) ×4 IMPLANT
DRAPE U-SHAPE 47X51 STRL (DRAPES) ×4 IMPLANT
DRILL CANNULATED AC REPAIR (INSTRUMENTS) ×4
DRSG MEPILEX BORDER 4X12 (GAUZE/BANDAGES/DRESSINGS) IMPLANT
DRSG MEPILEX BORDER 4X8 (GAUZE/BANDAGES/DRESSINGS) ×2 IMPLANT
DRSG PAD ABDOMINAL 8X10 ST (GAUZE/BANDAGES/DRESSINGS) ×1 IMPLANT
DURAPREP 26ML APPLICATOR (WOUND CARE) ×2 IMPLANT
ELECT REM PT RETURN 9FT ADLT (ELECTROSURGICAL) ×2
ELECTRODE REM PT RTRN 9FT ADLT (ELECTROSURGICAL) ×1 IMPLANT
FACESHIELD LNG OPTICON STERILE (SAFETY) ×2 IMPLANT
FIBERSTICK 2 (SUTURE) ×2 IMPLANT
GAUZE XEROFORM 1X8 LF (GAUZE/BANDAGES/DRESSINGS) ×1 IMPLANT
GAUZE XEROFORM 5X9 LF (GAUZE/BANDAGES/DRESSINGS) IMPLANT
GLOVE BIO SURGEON ST LM GN SZ9 (GLOVE) ×2 IMPLANT
GLOVE BIOGEL PI IND STRL 8 (GLOVE) ×1 IMPLANT
GLOVE BIOGEL PI INDICATOR 8 (GLOVE) ×1
GLOVE SURG ORTHO 8.0 STRL STRW (GLOVE) ×2 IMPLANT
GOWN PREVENTION PLUS LG XLONG (DISPOSABLE) IMPLANT
GOWN PREVENTION PLUS XLARGE (GOWN DISPOSABLE) ×2 IMPLANT
GOWN STRL NON-REIN LRG LVL3 (GOWN DISPOSABLE) ×4 IMPLANT
K-WIRE .062X3 (DISPOSABLE) ×2
KIT BASIN OR (CUSTOM PROCEDURE TRAY) ×2 IMPLANT
KIT INFUSE SMALL (Orthopedic Implant) ×1 IMPLANT
KIT ROOM TURNOVER OR (KITS) ×2 IMPLANT
KWIRE .062X3 (DISPOSABLE) IMPLANT
LOOP FIBERTAPE 2MM BLUE (VASCULAR PRODUCTS) ×2 IMPLANT
MANIFOLD NEPTUNE II (INSTRUMENTS) ×2 IMPLANT
NDL 18GX1X1/2 (RX/OR ONLY) (NEEDLE) IMPLANT
NEEDLE 18GX1X1/2 (RX/OR ONLY) (NEEDLE) ×4 IMPLANT
NEEDLE 21X1 OR PACK (NEEDLE) IMPLANT
NS IRRIG 1000ML POUR BTL (IV SOLUTION) ×2 IMPLANT
PACK SHOULDER (CUSTOM PROCEDURE TRAY) ×2 IMPLANT
PAD ARMBOARD 7.5X6 YLW CONV (MISCELLANEOUS) ×4 IMPLANT
PENCIL BUTTON HOLSTER BLD 10FT (ELECTRODE) IMPLANT
PLATE DISTAL CLAVICLE BUTTON (Plate) ×1 IMPLANT
PLATE DISTAL CLAVICLE SH RT SS (Plate) ×1 IMPLANT
SCREW LOCKING 2.7C8 (Screw) ×1 IMPLANT
SCREW LOCKING 2.7X10 ANKLE (Screw) ×2 IMPLANT
SCREW LOCKING 2.7X12 ANKLE (Screw) ×1 IMPLANT
SCREW LOCKING 2.7X14MM (Screw) ×1 IMPLANT
SCREW LOCKING 3.5X8 (Screw) ×1 IMPLANT
SCREW LOW PROFILE 3.5X14 (Screw) ×2 IMPLANT
SCREW LOW PROFILE 3.5X16 (Screw) ×1 IMPLANT
SCREW NLOCK T15 FT 18X3.5XST (Screw) IMPLANT
SCREW NON LOCK 3.5X18MM (Screw) ×2 IMPLANT
SCREW NON-LOCKING 3.5X12MM (Screw) ×1 IMPLANT
SLING ARM IMMOBILIZER LRG (SOFTGOODS) IMPLANT
SLING ARM IMMOBILIZER MED (SOFTGOODS) ×1 IMPLANT
SPONGE GAUZE 4X4 12PLY (GAUZE/BANDAGES/DRESSINGS) ×1 IMPLANT
SPONGE LAP 4X18 X RAY DECT (DISPOSABLE) ×4 IMPLANT
STAPLER VISISTAT 35W (STAPLE) IMPLANT
SUCTION FRAZIER TIP 10 FR DISP (SUCTIONS) IMPLANT
SUT VIC AB 0 CT1 27 (SUTURE) ×2
SUT VIC AB 0 CT1 27XBRD ANBCTR (SUTURE) IMPLANT
SUT VIC AB 1 CT1 27 (SUTURE) ×2
SUT VIC AB 1 CT1 27XBRD ANBCTR (SUTURE) IMPLANT
SUT VIC AB 2-0 CT1 27 (SUTURE) ×2
SUT VIC AB 2-0 CT1 TAPERPNT 27 (SUTURE) IMPLANT
SUT VIC AB 2-0 CTB1 (SUTURE) IMPLANT
SYR CONTROL 10ML LL (SYRINGE) ×2 IMPLANT
SYR TB 1ML LUER SLIP (SYRINGE) ×1 IMPLANT
TAPE CLOTH SURG 6X10 WHT LF (GAUZE/BANDAGES/DRESSINGS) ×1 IMPLANT
TOWEL OR 17X24 6PK STRL BLUE (TOWEL DISPOSABLE) ×2 IMPLANT
TOWEL OR 17X26 10 PK STRL BLUE (TOWEL DISPOSABLE) ×2 IMPLANT
TUBE CONNECTING 12X1/4 (SUCTIONS) IMPLANT
WATER STERILE IRR 1000ML POUR (IV SOLUTION) ×2 IMPLANT
YANKAUER SUCT BULB TIP NO VENT (SUCTIONS) IMPLANT

## 2013-05-05 NOTE — Anesthesia Procedure Notes (Signed)
Procedures Attempted Interscalene block Pt sedated. Unable to communicate due to deafness. Pt continuously moved and spoke through whole procedure. Unable to locate nerves with ultrasound. Attempted using only Nerve Stimulator. Located bundle, but was only able to inject 5cc 0.5% Marcaine with epi. Pt continuously moving. Aspirated blood and aborted procedure. Will use intravenous medicines for pain control. Arta Bruce MD

## 2013-05-05 NOTE — Progress Notes (Signed)
Nursing staff received yet another phone call from unknown family who adamantly refused to give her name. Kept stating that her son died because of the patient and that the patient was "majorly addicted." Continued to state the patient does not need pain medicine. Message relayed to charge nurse and Director of the unit. Patient has given no family information other than her mother and it was stated to the anonymous family member that no information can be released with the patients permission.

## 2013-05-05 NOTE — Anesthesia Postprocedure Evaluation (Signed)
  Anesthesia Post-op Note  Patient: Susan Short  Procedure(s) Performed: Procedure(s) with comments: OPEN REDUCTION INTERNAL FIXATION (ORIF) CLAVICULAR FRACTURE- right (Right) - Right Clavicle Fracture Non Union Takedown and Fixation with Bone Graft, Coracoclavicular Ligament Reconstruction with Tight Rope.  Patient Location: PACU  Anesthesia Type:General  Level of Consciousness: awake, oriented, sedated and patient cooperative  Airway and Oxygen Therapy: Patient Spontanous Breathing  Post-op Pain: mild  Post-op Assessment: Post-op Vital signs reviewed, Patient's Cardiovascular Status Stable, Respiratory Function Stable, Patent Airway, No signs of Nausea or vomiting and Pain level controlled  Post-op Vital Signs: stable  Complications: No apparent anesthesia complications

## 2013-05-05 NOTE — Progress Notes (Signed)
Pt c/o pain in shoulder Dr Michelle Piper called and informed new orders noted.

## 2013-05-05 NOTE — Preoperative (Signed)
Beta Blockers   Reason not to administer Beta Blockers:Not Applicable 

## 2013-05-05 NOTE — Anesthesia Postprocedure Evaluation (Signed)
Anesthesia Post Note  Patient: Susan Short  Procedure(s) Performed: Procedure(s) (LRB): OPEN REDUCTION INTERNAL FIXATION (ORIF) CLAVICULAR FRACTURE- right (Right)  Anesthesia type: general  Patient location: PACU  Post pain: Pain level controlled  Post assessment: Patient's Cardiovascular Status Stable  Last Vitals:  Filed Vitals:   05/05/13 1423  BP:   Pulse:   Temp: 36.9 C  Resp:     Post vital signs: Reviewed and stable  Level of consciousness: sedated  Complications: No apparent anesthesia complications

## 2013-05-05 NOTE — Anesthesia Preprocedure Evaluation (Signed)
Anesthesia Evaluation  Patient identified by MRN, date of birth, ID band Patient awake    Reviewed: Allergy & Precautions, H&P , NPO status , Patient's Chart, lab work & pertinent test results  History of Anesthesia Complications (+) DIFFICULT AIRWAY  Airway Mallampati: II TM Distance: >3 FB Neck ROM: Full    Dental   Pulmonary asthma ,          Cardiovascular     Neuro/Psych    GI/Hepatic GERD-  Medicated and Controlled,  Endo/Other    Renal/GU      Musculoskeletal   Abdominal   Peds  Hematology   Anesthesia Other Findings   Reproductive/Obstetrics                           Anesthesia Physical Anesthesia Plan  ASA: III  Anesthesia Plan: General   Post-op Pain Management:    Induction: Intravenous  Airway Management Planned: Oral ETT  Additional Equipment:   Intra-op Plan:   Post-operative Plan: Extubation in OR  Informed Consent: I have reviewed the patients History and Physical, chart, labs and discussed the procedure including the risks, benefits and alternatives for the proposed anesthesia with the patient or authorized representative who has indicated his/her understanding and acceptance.     Plan Discussed with: CRNA and Surgeon  Anesthesia Plan Comments:         Anesthesia Quick Evaluation

## 2013-05-05 NOTE — Transfer of Care (Signed)
Immediate Anesthesia Transfer of Care Note  Patient: Susan Short  Procedure(s) Performed: Procedure(s) with comments: OPEN REDUCTION INTERNAL FIXATION (ORIF) CLAVICULAR FRACTURE- right (Right) - Right Clavicle Fracture Non Union Takedown and Fixation with Bone Graft, Coracoclavicular Ligament Reconstruction with Tight Rope.  Patient Location: PACU  Anesthesia Type:General  Level of Consciousness: awake, alert , oriented and sedated  Airway & Oxygen Therapy: Patient Spontanous Breathing and Patient connected to nasal cannula oxygen  Post-op Assessment: Report given to PACU RN, Post -op Vital signs reviewed and stable and Patient moving all extremities  Post vital signs: Reviewed and stable  Complications: No apparent anesthesia complications

## 2013-05-05 NOTE — Progress Notes (Signed)
Patient overly sedated with PCA Dilaudid.  Will arouse, open eyes and sit forward with touch stimulation.  Respirations 10-12 with CO2 readings high 58-63.  Patient continuously removes CO2 monitoring nasal cannula.  O2 sat 96% on 2L.  On-call, Dr. Roda Shutters, notified and PCA discontinued and PRN dilaudid 0.5-1mg  ordered.  Will continue to monitor sedation level and pain control.  Continuous pulse ox in place, nasal cannula exchanged and reinforce use of oxygen with patient.  New pain management explained to patient.   Bed low and locked, call light in reach.

## 2013-05-05 NOTE — Progress Notes (Signed)
Nursing staff received a disturbing phone call from the patient's sister-in-law, who did not give her name, and spoke to the nurse on the phone via an interpreter line. She stated that the patient had addictions to narcotics and drug abuse. The sister-in-law also said that the mother also had addictions, and that the nephew committed sucide on Saturday after doing drugs for awhile with the two family members previously mentioned. This sister-in-law stated that she was very concerned for Susan Short being discharged with narcotic prescriptions.

## 2013-05-05 NOTE — Brief Op Note (Signed)
05/05/2013  1:45 PM  PATIENT:  Verlee Monte  42 y.o. female  PRE-OPERATIVE DIAGNOSIS:  Right Clavicle Fracture and Coracoclavicle Ligament Disruption.  POST-OPERATIVE DIAGNOSIS:  Right Clavicle Fracture and Coracoclavicle Ligament Disruption.  PROCEDURE:  Procedure(s): OPEN REDUCTION INTERNAL FIXATION (ORIF) CLAVICULAR FRACTURE- right  SURGEON:  Surgeon(s): Cammy Copa, MD  ASSISTANT: S vernon pa  ANESTHESIA:   general  EBL: 50 ml    Total I/O In: 1500 [I.V.:1500] Out: 975 [Urine:850; Blood:125]  BLOOD ADMINISTERED: none  DRAINS: none   LOCAL MEDICATIONS USED:  none  SPECIMEN:  No Specimen  COUNTS:  YES  TOURNIQUET:  * No tourniquets in log *  DICTATION: .Other Dictation: Dictation Number [id number]  PLAN OF CARE: Admit for overnight observation  PATIENT DISPOSITION:  PACU - hemodynamically stable

## 2013-05-05 NOTE — Progress Notes (Signed)
ANTICOAGULATION CONSULT NOTE - Initial Consult  Pharmacy Consult for Coumadin Indication: Open Reduction Internal Fixation Clavicular fracture  Allergies  Allergen Reactions  . Ketorolac Tromethamine Hives  . Nsaids Hives  . Tramadol     seizure   . Ibuprofen Hives and Rash    Patient Measurements: Height: 5\' 3"  (160 cm) Weight: 164 lb (74.39 kg) IBW/kg (Calculated) : 52.4 Heparin Dosing Weight:   Vital Signs: Temp: 98.3 F (36.8 C) (09/23 1643) Temp src: Oral (09/23 0906) BP: 97/60 mmHg (09/23 1643) Pulse Rate: 79 (09/23 1612)  Labs:  Recent Labs  05/05/13 0900  HGB 13.5  HCT 39.9  PLT 282  CREATININE 1.10    Estimated Creatinine Clearance: 63.7 ml/min (by C-G formula based on Cr of 1.1).   Medical History: Past Medical History  Diagnosis Date  . Panic attack   . Hard of hearing   . Deaf   . Asthma   . GERD (gastroesophageal reflux disease)   . Difficult intubation     Patient reports that they had to use a small tube; MAC3, grade 1 view, 1 attempt, 7.0 ETT on 05/11/09    Medications:  Scheduled:  . ALPRAZolam  1 mg Oral TID  . amitriptyline  250 mg Oral QHS  . coumadin book   Does not apply Once  . HYDROmorphone      . HYDROmorphone      . HYDROmorphone PCA 0.3 mg/mL      . [START ON 05/06/2013] influenza vac split quadrivalent PF  0.5 mL Intramuscular Tomorrow-1000  . midazolam      . mupirocin ointment   Nasal BID  . mupirocin ointment      . pantoprazole  40 mg Oral Daily  . vancomycin  1,000 mg Intravenous Q12H  . warfarin  7.5 mg Oral Once  . [START ON 05/06/2013] warfarin   Does not apply Once  . [START ON 05/06/2013] Warfarin - Pharmacist Dosing Inpatient   Does not apply q1800  . zolpidem  5 mg Oral QHS    Assessment: 42 yr old female presented for repair of her right clavicle fracture and coracoclavicle ligament disruption. She is now to start on coumadin for  prophylactic VTE .  Goal of Therapy:  INR 2-3    Plan:  1) Coumadin  7.5 mg tonight 2) Daily INR 3) Ordered coumadin book and video.  Eugene Garnet 05/05/2013,8:31 PM

## 2013-05-05 NOTE — H&P (Signed)
Susan Short is an 42 y.o. female.   Chief Complaint: Right shoulder pain HPI: Fluids is a 42 year old patient with right shoulder pain. Many months ago she sustained an injury to the right shoulder. Since initially treated elsewhere without surgery. 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 This is been persistently painful for the patient. Patient also is a heavy smoker and has low vitamin D level. Vitamin D level has been corrected and the patient has been counseled extensively regarding the risk of smoking during her recovery period patient does have for operative management of her shoulder injury and clavicular nonunion.  Past Medical History  Diagnosis Date  . Panic attack   . Hard of hearing   . Deaf   . Asthma   . GERD (gastroesophageal reflux disease)   . Difficult intubation     Patient reports that they had to use a small tube; MAC3, grade 1 view, 1 attempt, 7.0 ETT on 05/11/09    Past Surgical History  Procedure Laterality Date  . Shoulder surgery      x 3  . Abdominal hysterectomy    . Cesarean section      x 2  . Thumb operation    . Appendectomy      as child  . Tonsillectomy and adenoidectomy      as a child  . Cervical fusion      History reviewed. No pertinent family history. Social History:  reports that she has been smoking.  She has never used smokeless tobacco. She reports that she does not drink alcohol or use illicit drugs.  Allergies:  Allergies  Allergen Reactions  . Ketorolac Tromethamine   . Nsaids Hives  . Tramadol     seizure   . Ibuprofen Rash    Medications Prior to Admission  Medication Sig Dispense Refill  . albuterol (PROVENTIL HFA;VENTOLIN HFA) 108 (90 BASE) MCG/ACT inhaler Inhale 2 puffs into the lungs every 6 (six) hours as needed for wheezing or shortness of breath.       . ALPRAZolam (XANAX) 1 MG tablet Take 1 mg by mouth See admin  instructions. Take 1 tab in the morning, take 0.5 at noon, thae 2 tabs at 8 pm and 1 tab in the middle of the night.      Marland Kitchen amitriptyline (ELAVIL) 50 MG tablet Take 250 mg by mouth at bedtime.       Marland Kitchen omeprazole (PRILOSEC) 20 MG capsule Take 20 mg by mouth daily.      Marland Kitchen oxyCODONE (ROXICODONE) 15 MG immediate release tablet Take 15 mg by mouth every 4 (four) hours as needed. For pain      . pregabalin (LYRICA) 100 MG capsule Take 100 mg by mouth 2 (two) times daily.       Marland Kitchen zolpidem (AMBIEN CR) 12.5 MG CR tablet Take 12.5 mg by mouth at bedtime as needed.      . carisoprodol (SOMA) 350 MG tablet Take 350 mg by mouth 3 (three) times daily.        Results for orders placed during the hospital encounter of 05/05/13 (from the past 48 hour(s))  CBC     Status: None   Collection Time    05/05/13  9:00 AM      Result Value Range   WBC 8.3  4.0 - 10.5 K/uL   RBC 4.38  3.87 - 5.11 MIL/uL   Hemoglobin 13.5  12.0 -  15.0 g/dL   HCT 21.3  08.6 - 57.8 %   MCV 91.1  78.0 - 100.0 fL   MCH 30.8  26.0 - 34.0 pg   MCHC 33.8  30.0 - 36.0 g/dL   RDW 46.9  62.9 - 52.8 %   Platelets 282  150 - 400 K/uL  BASIC METABOLIC PANEL     Status: Abnormal   Collection Time    05/05/13  9:00 AM      Result Value Range   Sodium 132 (*) 135 - 145 mEq/L   Potassium 4.9  3.5 - 5.1 mEq/L   Chloride 94 (*) 96 - 112 mEq/L   CO2 28  19 - 32 mEq/L   Glucose, Bld 106 (*) 70 - 99 mg/dL   BUN 11  6 - 23 mg/dL   Creatinine, Ser 4.13  0.50 - 1.10 mg/dL   Calcium 9.7  8.4 - 24.4 mg/dL   GFR calc non Af Amer 61 (*) >90 mL/min   GFR calc Af Amer 70 (*) >90 mL/min   Comment: (NOTE)     The eGFR has been calculated using the CKD EPI equation.     This calculation has not been validated in all clinical situations.     eGFR's persistently <90 mL/min signify possible Chronic Kidney     Disease.   Dg Chest 2 View  05/05/2013   CLINICAL DATA:  Asthma. GERD. History of smoking.  EXAM: CHEST  2 VIEW  COMPARISON:  03/25/2013   FINDINGS: Heart size is normal. There is perihilar peribronchial thickening. Linear densities at the lung bases are again noted, consistent with atelectasis or scarring. There are no focal consolidations or pleural effusions. The patient has had previous anterior cervical fusion.  IMPRESSION: 1. Bronchitic changes. 2. Bibasilar atelectasis or scarring. 3. No new consolidations.   Electronically Signed   By: Rosalie Gums M.D.   On: 05/05/2013 09:12    Review of Systems  Unable to perform ROS Constitutional: Negative.   HENT: Negative.   Eyes: Negative.   Respiratory: Negative.   Cardiovascular: Negative.   Gastrointestinal: Negative.   Genitourinary: Negative.   Musculoskeletal: Positive for joint pain.  Skin: Negative.   Neurological: Negative.   Endo/Heme/Allergies: Negative.   Psychiatric/Behavioral: Negative.     Blood pressure 116/53, pulse 82, temperature 98.5 F (36.9 C), temperature source Oral, resp. rate 18, height 5\' 3"  (1.6 m), weight 74.39 kg (164 lb), SpO2 99.00%. Physical Exam  Constitutional: She appears well-developed.  HENT:  Head: Normocephalic.  Eyes: Pupils are equal, round, and reactive to light.  Neck: Normal range of motion.  Cardiovascular: Normal rate.   Respiratory: Effort normal.  GI: Soft.  Neurological: She is alert.  Skin: Skin is warm.  Psychiatric: She has a normal mood and affect.   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 Plan is open reduction internal fixation with clavicular plate possible coracoclavicular fixation with Hygroton nor to ensure and maximize chance for healing. Risk and benefits are discussed with the patient including but limited to infection nerve vessel damage nonunion malunion persistent pain  as well as need for more surgery. Need for her to stop smoking is in his is stressed limited pain medicine will be available after surgery but is the patient's history of narcotic use. All questions answered plan for  overnight observation as well as interscalene block no family history of DVT or pulmonary and was him  Avon Mergenthaler SCOTT 05/05/2013, 9:56 AM

## 2013-05-06 LAB — PROTIME-INR: INR: 0.94 (ref 0.00–1.49)

## 2013-05-06 MED ORDER — WARFARIN SODIUM 7.5 MG PO TABS
7.5000 mg | ORAL_TABLET | Freq: Once | ORAL | Status: AC
  Administered 2013-05-06: 7.5 mg via ORAL
  Filled 2013-05-06 (×2): qty 1

## 2013-05-06 MED ORDER — OXYCODONE HCL ER 10 MG PO T12A
10.0000 mg | EXTENDED_RELEASE_TABLET | Freq: Two times a day (BID) | ORAL | Status: DC
  Administered 2013-05-06 – 2013-05-07 (×3): 10 mg via ORAL
  Filled 2013-05-06 (×3): qty 1

## 2013-05-06 NOTE — Progress Notes (Signed)
Patient with continued compliant of pain 9-10/10 to right shoulder. Patient given pain medications as ordered. Patient states that she is still having pain while receiving scheduled oxycontin, prn oxycodone and dilaudid IV. Dr August Saucer called and notified at 1645. No new orders received. MD states that patient will stay overnight and do PT tomorrow then will be discharge home.

## 2013-05-06 NOTE — Progress Notes (Signed)
Subjective: Pt stable - c/o pain right shoulder   Objective: Vital signs in last 24 hours: Temp:  [97 F (36.1 C)-99.5 F (37.5 C)] 99.5 F (37.5 C) (09/24 0711) Pulse Rate:  [72-89] 73 (09/23 2121) Resp:  [8-19] 18 (09/24 0606) BP: (93-129)/(41-86) 102/57 mmHg (09/23 2121) SpO2:  [91 %-100 %] 97 % (09/24 0606) FiO2 (%):  [28 %] 28 % (09/23 1645)  Intake/Output from previous day: 09/23 0701 - 09/24 0700 In: 2675 [P.O.:720; I.V.:1900; IV Piggyback:55] Out: 975 [Urine:850; Blood:125] Intake/Output this shift:    Exam:  Neurovascular intact Sensation intact distally Intact pulses distally  Labs:  Recent Labs  05/05/13 0900  HGB 13.5    Recent Labs  05/05/13 0900  WBC 8.3  RBC 4.38  HCT 39.9  PLT 282    Recent Labs  05/05/13 0900  NA 132*  K 4.9  CL 94*  CO2 28  BUN 11  CREATININE 1.10  GLUCOSE 106*  CALCIUM 9.7    Recent Labs  05/05/13 2255  INR 1.05    Assessment/Plan: Oral pain meds today - ot today - dc am   Osa Fogarty SCOTT 05/06/2013, 7:17 AM

## 2013-05-06 NOTE — Op Note (Signed)
Susan Short NO.:  [id number]  MEDICAL RECORD NO.:  [id number]  LOCATION:  5N20C                        FACILITY:  [facility]  PHYSICIAN:  Burnard Bunting, M.D.    DATE OF BIRTH:  07/19/1971  DATE OF PROCEDURE:  05/05/2013 DATE OF DISCHARGE:                              OPERATIVE REPORT   PREOPERATIVE DIAGNOSIS:  Right distal clavicle fracture with nonunion.  POSTOPERATIVE DIAGNOSIS:  Right distal clavicle fracture with nonunion.  PROCEDURE:  Right distal clavicle fracture nonunion takedown with open reduction and internal fixation using Arthrex plate, infuse bone graft.  SURGEON:  Burnard Bunting, M.D.  ASSISTANT:  Wende Neighbors, P.A.  ANESTHESIA:  General endotracheal.  ESTIMATED BLOOD LOSS:  25 mL.  DRAINS:  None.  INDICATIONS:  Susan Short is a 42 year old patient with right shoulder pain, clavicle nonunion who presents for operative management after explanation of risks and benefits.  PROCEDURE IN DETAIL:  The patient was brought to the operating room where general endotracheal anesthesia was induced.  Preoperative antibiotics were administered.  Vancomycin for known MRSA colonization. The patient was placed in a beach-chair position with the head in neutral position.  The right arm, shoulder, and hand was prescrubbed with alcohol and Betadine, which was allowed to air dry, prepped with DuraPrep solution, draped in sterile manner.  [name] was used to cover the operative field.  Time-out was called.  Incision was made obliquely between the clavicle and the coracoid extending to the posterolateral aspect of the distal clavicle fracture.  Skin and subcutaneous tissues were sharply divided.  Deltopectoral interval was developed over the coracoid process.  Coracoid process was then identified.  Conjoined tendon divided longitudinally for 2 cm in order to palpate the undersurface of the coracoid.  Base of the coracoid was drilled in its central  portion with retractors placed on the medial lateral aspect of the coracoid process.  At this time, the suture was passed from proximal to distal with care being taken to avoid injury to underlying neurovascular structures.  At this time, the suture was held, the clavicle was then exposed both the medial fragment and the lateral fragment.  Thorough debridement of tissue between the 2 fragments was performed.  This was done with a knife, curette, and rongeur. Intramedullary canal was reestablished with a drill.  At this time, Arthrex plate was applied on the clavicle.  It seated well on the medial and lateral aspect of the fracture.  The fracture itself was reduced and after thorough irrigation, infuse bone graft with bone chips were placed between the 2 fragments.  Lag screw was placed in addition across the medial and lateral fragments of the clavicle fracture.  At this time, the path was created from the base of the coracoid to the undersurface of the clavicle and the tight rope was then placed.  Under fluoroscopic guidance, screw lengths were checked, which were accurate and tight rope was placed to maintain the reduction.  This was confirmed under fluoroscopic guidance.  At this time, thorough irrigation was performed. Second sheet of infuse bone graft was placed on the posterior and anterior aspect of the nonunion site.  The fascia was then closed over  the clavicle.  The skin was then closed using interrupted inverted 0- Vicryl suture, 2-0 Vicryl suture, and 3-0 pullout Prolene.  Shoulder sling applied.  [name]'s assistance required at all times during the case for retraction, irrigation, mobilization, drilling, opening, and closing.  Her assistance was a medical necessity.     Burnard Bunting, M.D.     GSD/MEDQ  D:  05/05/2013  T:  05/06/2013  Job:  [id number]

## 2013-05-06 NOTE — Progress Notes (Signed)
At 2300 patient awake, crying with pain to right shoulder, tore dressing to shoulder off, getting out of bed stating "I'm leaving, you're not helping my pain."   PRN dilaudid given at 2100 and 2245 with no relief.  Dr. Roda Shutters notified of uncontrolled pain and PCA low dose morphine ordered and started at 2315.  Patient requiring frequent monitoring for CO2 monitor alarming d/t pulling it off and also for safety.    Valium given at 0220 for spasms.  Pt has finally fell off to sleep at 0320 and continues to rest quietly at this time.  O2 Sat 99% on 2L and respirations even and unlabored at 14.  Continue to monitor.

## 2013-05-06 NOTE — Progress Notes (Signed)
ANTICOAGULATION CONSULT NOTE - Initial Consult  Pharmacy Consult for Coumadin Indication: Open Reduction Internal Fixation Clavicular fracture  Allergies  Allergen Reactions  . Ketorolac Tromethamine Hives  . Nsaids Hives  . Tramadol     seizure   . Ibuprofen Hives and Rash    Patient Measurements: Height: 5\' 3"  (160 cm) Weight: 164 lb (74.39 kg) IBW/kg (Calculated) : 52.4 Heparin Dosing Weight:   Vital Signs: Temp: 98.6 F (37 C) (09/24 1319) Temp src: Oral (09/24 1319) BP: 102/61 mmHg (09/24 1319) Pulse Rate: 91 (09/24 1319)  Labs:  Recent Labs  05/05/13 0900 05/05/13 2255 05/06/13 1436  HGB 13.5  --   --   HCT 39.9  --   --   PLT 282  --   --   LABPROT  --  13.5 12.4  INR  --  1.05 0.94  CREATININE 1.10  --   --     Estimated Creatinine Clearance: 63.7 ml/min (by C-G formula based on Cr of 1.1).   Medical History: Past Medical History  Diagnosis Date  . Panic attack   . Hard of hearing   . Deaf   . Asthma   . GERD (gastroesophageal reflux disease)   . Difficult intubation     Patient reports that they had to use a small tube; MAC3, grade 1 view, 1 attempt, 7.0 ETT on 05/11/09    Medications:  Scheduled:  . ALPRAZolam  1 mg Oral TID  . amitriptyline  250 mg Oral QHS  . mupirocin ointment   Nasal BID  . OxyCODONE  10 mg Oral Q12H  . pantoprazole  40 mg Oral Daily  . warfarin   Does not apply Once  . Warfarin - Pharmacist Dosing Inpatient   Does not apply q1800  . zolpidem  5 mg Oral QHS    Assessment: 42 yr old female POD #1 s/p repair of her right clavicle fracture and coracoclavicle ligament disruption. She is on coumadin for VTE prophylaxis.  The INR today is 0.94 after 1 dose of coumadin .  No bleeding reported.  No CBC done today.   Coumadin predictor score = 5 points.   Goal of Therapy:  INR 2-3    Plan:  1) Coumadin 7.5 mg tonight 2) Daily INR  Noah Delaine, RPh Clinical Pharmacist Pager: [phone]  05/06/2013,6:24 PM

## 2013-05-06 NOTE — Evaluation (Signed)
Occupational Therapy Evaluation Patient Details Name: Susan Short MRN: [id number] DOB: 1971/05/30 Today's Date: 05/06/2013 Time: 1478-2956 OT Time Calculation (min): 53 min  OT Assessment / Plan / Recommendation History of present illness 42 yo with right distal clavical fracture nonunion s/p ORIF   Clinical Impression   Patient is s/p above surgery resulting in functional limitations due to the deficits listed below (see OT Problem List). Patient is deaf yet can read lips.  Education and handouts were reviewed with patient and her mother on precautions, limited exercises, sling use, safety and independence with ADL and functional mobility for ADL to allow facilitate discharge home. Patient is ready for discharge from OT standpoint.    OT Assessment  Progress rehab of shoulder as ordered by MD at follow-up appointment    Follow Up Recommendations  No OT follow up while in hospital   Equipment Recommendations  None recommended by OT    Precautions / Restrictions Precautions Precautions: Shoulder Type of Shoulder Precautions: no pushing,pulling or lifting, OK for limited AAROM exercises and Pendulum exercises (Handout provided) Shoulder Interventions: Shoulder sling/immobilizer;For comfort (and when ambulating) Precaution Booklet Issued: Yes (comment) Required Braces or Orthoses: Sling Restrictions Weight Bearing Restrictions:  (WBAT)   Pertinent Vitals/Pain Not formally rated, more painful with activity and with exercises.  Rest, repositioned and ice    ADL  Upper Body Bathing: Simulated;Minimal assistance Where Assessed - Upper Body Bathing: Supported standing;Unsupported sitting Lower Body Bathing: Simulated;Minimal assistance Where Assessed - Lower Body Bathing: Supported standing;Unsupported sitting Upper Body Dressing: Simulated;Minimal assistance Where Assessed - Upper Body Dressing: Unsupported sitting Lower Body Dressing: Simulated;Minimal assistance Where  Assessed - Lower Body Dressing: Unsupported sitting;Supported standing Toilet Transfer: Performed;Min guard Toilet Transfer Method: Sit to stand;Stand pivot Acupuncturist: Comfort height toilet Toileting - Clothing Manipulation and Hygiene: Performed;Min guard Where Assessed - Engineer, mining and Hygiene: Standing;Sit to stand from 3-in-1 or toilet Transfers/Ambulation Related to ADLs: Patient with 1-2 minimal LOB due to sleepiness and patients mother independent assisting patient PRN ADL Comments: Patient's mother has assisted patient with a sponge bath since surgery and reports that she has her daughter do as much as she can.      Visit Information  Last OT Received On: 05/06/13 History of Present Illness: 42 yo with right distal clavical fracture nonunion s/p ORIF       Prior Functioning     Home Living Family/patient expects to be discharged to:: Private residence Available Help at Discharge: Family (mother) Home Equipment: None Communication Communication: Deaf (can read lips)   Cognition  Cognition Arousal/Alertness: Suspect due to medications;Lethargic Behavior During Therapy: WFL for tasks assessed/performed    Extremity/Trunk Assessment Upper Extremity Assessment Upper Extremity Assessment: RUE deficits/detail RUE Deficits / Details: unable to fully assess at this time due to post surgical limitations (per protocol) and pain, A/AAROM grossly WFL distal to shoulder  Lower Extremity Assessment Lower Extremity Assessment:  (c/o soreness and pain in BLEs.)     Mobility Bed Mobility Bed Mobility: Rolling Left;Left Sidelying to Sit Rolling Left: 5: Supervision Left Sidelying to Sit: 5: Supervision Transfers Transfers: Sit to Stand Sit to Stand: 4: Min guard Details for Transfer Assistance: requires cues (demo cues or she can read lips)     End of Session OT - End of Session Activity Tolerance: Patient limited by lethargy;Patient limited  by pain Patient left: in bed;with call bell/phone within reach;with family/visitor present Nurse Communication:  (patient reports excessive iching.)  GO Functional Assessment Tool Used: clinical  judgement Functional Limitation: Self care Self Care Current Status [phone]): At least 20 percent but less than 40 percent impaired, limited or restricted Self Care Goal Status (U0454): At least 20 percent but less than 40 percent impaired, limited or restricted Self Care Discharge Status [phone]): At least 20 percent but less than 40 percent impaired, limited or restricted   Madie Cahn 05/06/2013, 1:33 PM

## 2013-05-07 MED ORDER — METHOCARBAMOL 500 MG PO TABS: 500.0000 mg | ORAL_TABLET | Freq: Three times a day (TID) | ORAL | Status: DC

## 2013-05-07 MED ORDER — OXYCODONE HCL 5 MG PO TABS: 5.0000 mg | ORAL_TABLET | ORAL | Status: DC | PRN

## 2013-05-07 MED ORDER — OXYCODONE HCL ER 10 MG PO T12A: 10.0000 mg | EXTENDED_RELEASE_TABLET | Freq: Two times a day (BID) | ORAL | Status: DC

## 2013-05-07 NOTE — Progress Notes (Signed)
Subjective: Pt stable - pain slightly better controlled   Objective: Vital signs in last 24 hours: Temp:  [98.6 F (37 C)-100.3 F (37.9 C)] 99.2 F (37.3 C) (09/25 0624) Pulse Rate:  [90-93] 90 (09/25 0624) Resp:  [18-20] 18 (09/25 0624) BP: (102-161)/(61-65) 136/65 mmHg (09/25 0624) SpO2:  [96 %-99 %] 98 % (09/25 0624) FiO2 (%):  [28 %] 28 % (09/24 0835)  Intake/Output from previous day: 09/24 0701 - 09/25 0700 In: 570 [P.O.:570] Out: 600 [Urine:600] Intake/Output this shift:    Exam:  Neurovascular intact Sensation intact distally Intact pulses distally  Labs:  Recent Labs  05/05/13 0900  HGB 13.5    Recent Labs  05/05/13 0900  WBC 8.3  RBC 4.38  HCT 39.9  PLT 282    Recent Labs  05/05/13 0900  NA 132*  K 4.9  CL 94*  CO2 28  BUN 11  CREATININE 1.10  GLUCOSE 106*  CALCIUM 9.7    Recent Labs  05/05/13 2255 05/06/13 1436  INR 1.05 0.94    Assessment/Plan: Dc home today   DEAN,GREGORY SCOTT 05/07/2013, 8:27 AM

## 2013-05-07 NOTE — Progress Notes (Signed)
Patient's pain has remained 10/10.  She refuses to use sling and SCD's.  She is lethargic but easily aroused. She has been getting up out of bed without using call bell for assistance. She is reminded to call for assistance before getting up. Her mother has been here the entire night and has been assisting with her care.  Patient is currently asleep.

## 2013-05-08 ENCOUNTER — Encounter (HOSPITAL_COMMUNITY): Payer: Self-pay | Admitting: Orthopedic Surgery

## 2013-05-08 NOTE — Discharge Summary (Signed)
Physician Discharge Summary  Patient ID: Susan Short MRN: [id number] DOB/AGE: 11/17/1970 42 y.o.  Admit date: 05/05/2013 Discharge date: 05/07/2013 Admission Diagnoses:  Clavicle fracture nonunion  Discharge Diagnoses:  Same  Surgeries: Procedure(s): OPEN REDUCTION INTERNAL FIXATION (ORIF) CLAVICULAR FRACTURE- right on 05/05/2013   Consultants:    Discharged Condition: Stable  Hospital Course: Susan Short is an 42 y.o. 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  They were brought to the operating room on 05/05/2013 and underwent the above named procedures.  She tolerated the procedure well and was ambulating in the hall before dc  Antibiotics given:  Anti-infectives   Start     Dose/Rate Route Frequency Ordered Stop   05/05/13 2300  vancomycin (VANCOCIN) IVPB 1000 mg/200 mL premix     1,000 mg 200 mL/hr over 60 Minutes Intravenous Every 12 hours 05/05/13 1645 05/06/13 0054   05/05/13 1013  vancomycin (VANCOCIN) 1 GM/200ML IVPB    Comments:  KENSMOE, KATHERINE: cabinet override      05/05/13 1013 05/05/13 1100   05/05/13 0600  ceFAZolin (ANCEF) IVPB 2 g/50 mL premix  Status:  Discontinued     2 g 100 mL/hr over 30 Minutes Intravenous On call to O.R. 05/04/13 1431 05/05/13 1643    .  Recent vital signs:  Filed Vitals:   05/07/13 0624  BP: 136/65  Pulse: 90  Temp: 99.2 F (37.3 C)  Resp: 18    Recent laboratory studies:  Results for orders placed during the hospital encounter of 05/05/13  SURGICAL PCR SCREEN      Result Value Range   MRSA, PCR POSITIVE (*) NEGATIVE   Staphylococcus aureus POSITIVE (*) NEGATIVE  CBC      Result Value Range   WBC 8.3  4.0 - 10.5 K/uL   RBC 4.38  3.87 - 5.11 MIL/uL   Hemoglobin 13.5  12.0 - 15.0 g/dL   HCT 40.9  81.1 - 91.4 %   MCV 91.1  78.0 - 100.0 fL   MCH 30.8  26.0 - 34.0 pg   MCHC 33.8  30.0 - 36.0 g/dL   RDW 78.2  95.6 -  21.3 %   Platelets 282  150 - 400 K/uL  BASIC METABOLIC PANEL      Result Value Range   Sodium 132 (*) 135 - 145 mEq/L   Potassium 4.9  3.5 - 5.1 mEq/L   Chloride 94 (*) 96 - 112 mEq/L   CO2 28  19 - 32 mEq/L   Glucose, Bld 106 (*) 70 - 99 mg/dL   BUN 11  6 - 23 mg/dL   Creatinine, Ser 0.86  0.50 - 1.10 mg/dL   Calcium 9.7  8.4 - 57.8 mg/dL   GFR calc non Af Amer 61 (*) >90 mL/min   GFR calc Af Amer 70 (*) >90 mL/min  PROTIME-INR      Result Value Range   Prothrombin Time 13.5  11.6 - 15.2 seconds   INR 1.05  0.00 - 1.49  PROTIME-INR      Result Value Range   Prothrombin Time 12.4  11.6 - 15.2 seconds   INR 0.94  0.00 - 1.49    Discharge Medications:     Medication List         albuterol 108 (90 BASE) MCG/ACT inhaler  Commonly known as:  PROVENTIL HFA;VENTOLIN HFA  Inhale 2 puffs into the lungs every 6 (six) hours as  needed for wheezing or shortness of breath.     ALPRAZolam 1 MG tablet  Commonly known as:  XANAX  Take 1 mg by mouth 3 (three) times daily.     amitriptyline 50 MG tablet  Commonly known as:  ELAVIL  Take 250 mg by mouth at bedtime.     methocarbamol 500 MG tablet  Commonly known as:  ROBAXIN  Take 1 tablet (500 mg total) by mouth 3 (three) times daily.     omeprazole 40 MG capsule  Commonly known as:  PRILOSEC  Take 40 mg by mouth at bedtime.     oxyCODONE 5 MG immediate release tablet  Commonly known as:  Oxy IR/ROXICODONE  Take 1-2 tablets (5-10 mg total) by mouth every 3 (three) hours as needed.     OxyCODONE 10 mg T12a 12 hr tablet  Commonly known as:  OXYCONTIN  Take 1 tablet (10 mg total) by mouth every 12 (twelve) hours.     zolpidem 10 MG tablet  Commonly known as:  AMBIEN  Take 10 mg by mouth at bedtime.        Diagnostic Studies: Dg Chest 2 View  05/05/2013   CLINICAL DATA:  Asthma. GERD. History of smoking.  EXAM: CHEST  2 VIEW  COMPARISON:  03/25/2013  FINDINGS: Heart size is normal. There is perihilar peribronchial  thickening. Linear densities at the lung bases are again noted, consistent with atelectasis or scarring. There are no focal consolidations or pleural effusions. The patient has had previous anterior cervical fusion.  IMPRESSION: 1. Bronchitic changes. 2. Bibasilar atelectasis or scarring. 3. No new consolidations.   Electronically Signed   By: Rosalie Gums M.D.   On: 05/05/2013 09:12   Dg Clavicle Right  05/05/2013   CLINICAL DATA:  ORIF distal clavicle fracture.  EXAM: RIGHT CLAVICLE - 2+ VIEWS  COMPARISON:  Right shoulder x-ray 03/25/2013.  FINDINGS: ORIF of the distal clavicle fracture with plate and screw fixation. Anatomic alignment in the AP projection.  IMPRESSION: Anatomic alignment in the AP projection post ORIF of the distal clavicle fracture.   Electronically Signed   By: Hulan Saas   On: 05/05/2013 13:41   Dg C-arm 1-60 Min  05/05/2013   CLINICAL DATA:  ORIF distal clavicle fracture.  EXAM: RIGHT CLAVICLE - 2+ VIEWS  COMPARISON:  Right shoulder x-ray 03/25/2013.  FINDINGS: ORIF of the distal clavicle fracture with plate and screw fixation. Anatomic alignment in the AP projection.  IMPRESSION: Anatomic alignment in the AP projection post ORIF of the distal clavicle fracture.   Electronically Signed   By: Hulan Saas   On: 05/05/2013 13:41    Disposition: 01-Home or Self Care      Discharge Orders   Future Orders Complete By Expires   Call MD / Call 911  As directed    Comments:     If you experience chest pain or shortness of breath, CALL 911 and be transported to the hospital emergency room.  If you develope a fever above 101 F, pus (white drainage) or increased drainage or redness at the wound, or calf pain, call your surgeon's office.   Constipation Prevention  As directed    Comments:     Drink plenty of fluids.  Prune juice may be helpful.  You may use a stool softener, such as Colace (over the counter) 100 mg twice a day.  Use MiraLax (over the counter) for constipation  as needed.   Diet - low sodium heart healthy  As directed    Discharge instructions  As directed    Comments:     Keep incision dry Pendulums 3 times a day 10 reps each way No lifting with right arm   Increase activity slowly as tolerated  As directed          Signed: DEAN,GREGORY SCOTT 05/08/2013, 3:20 PM

## 2014-02-02 IMAGING — CR DG CHEST 2V
2 series · 2 of 2 positions shown · non-contrast
Comparison: 03/25/2013

CLINICAL DATA: Asthma. GERD. History of smoking.

EXAM:
CHEST  2 VIEW

[w chest pa]
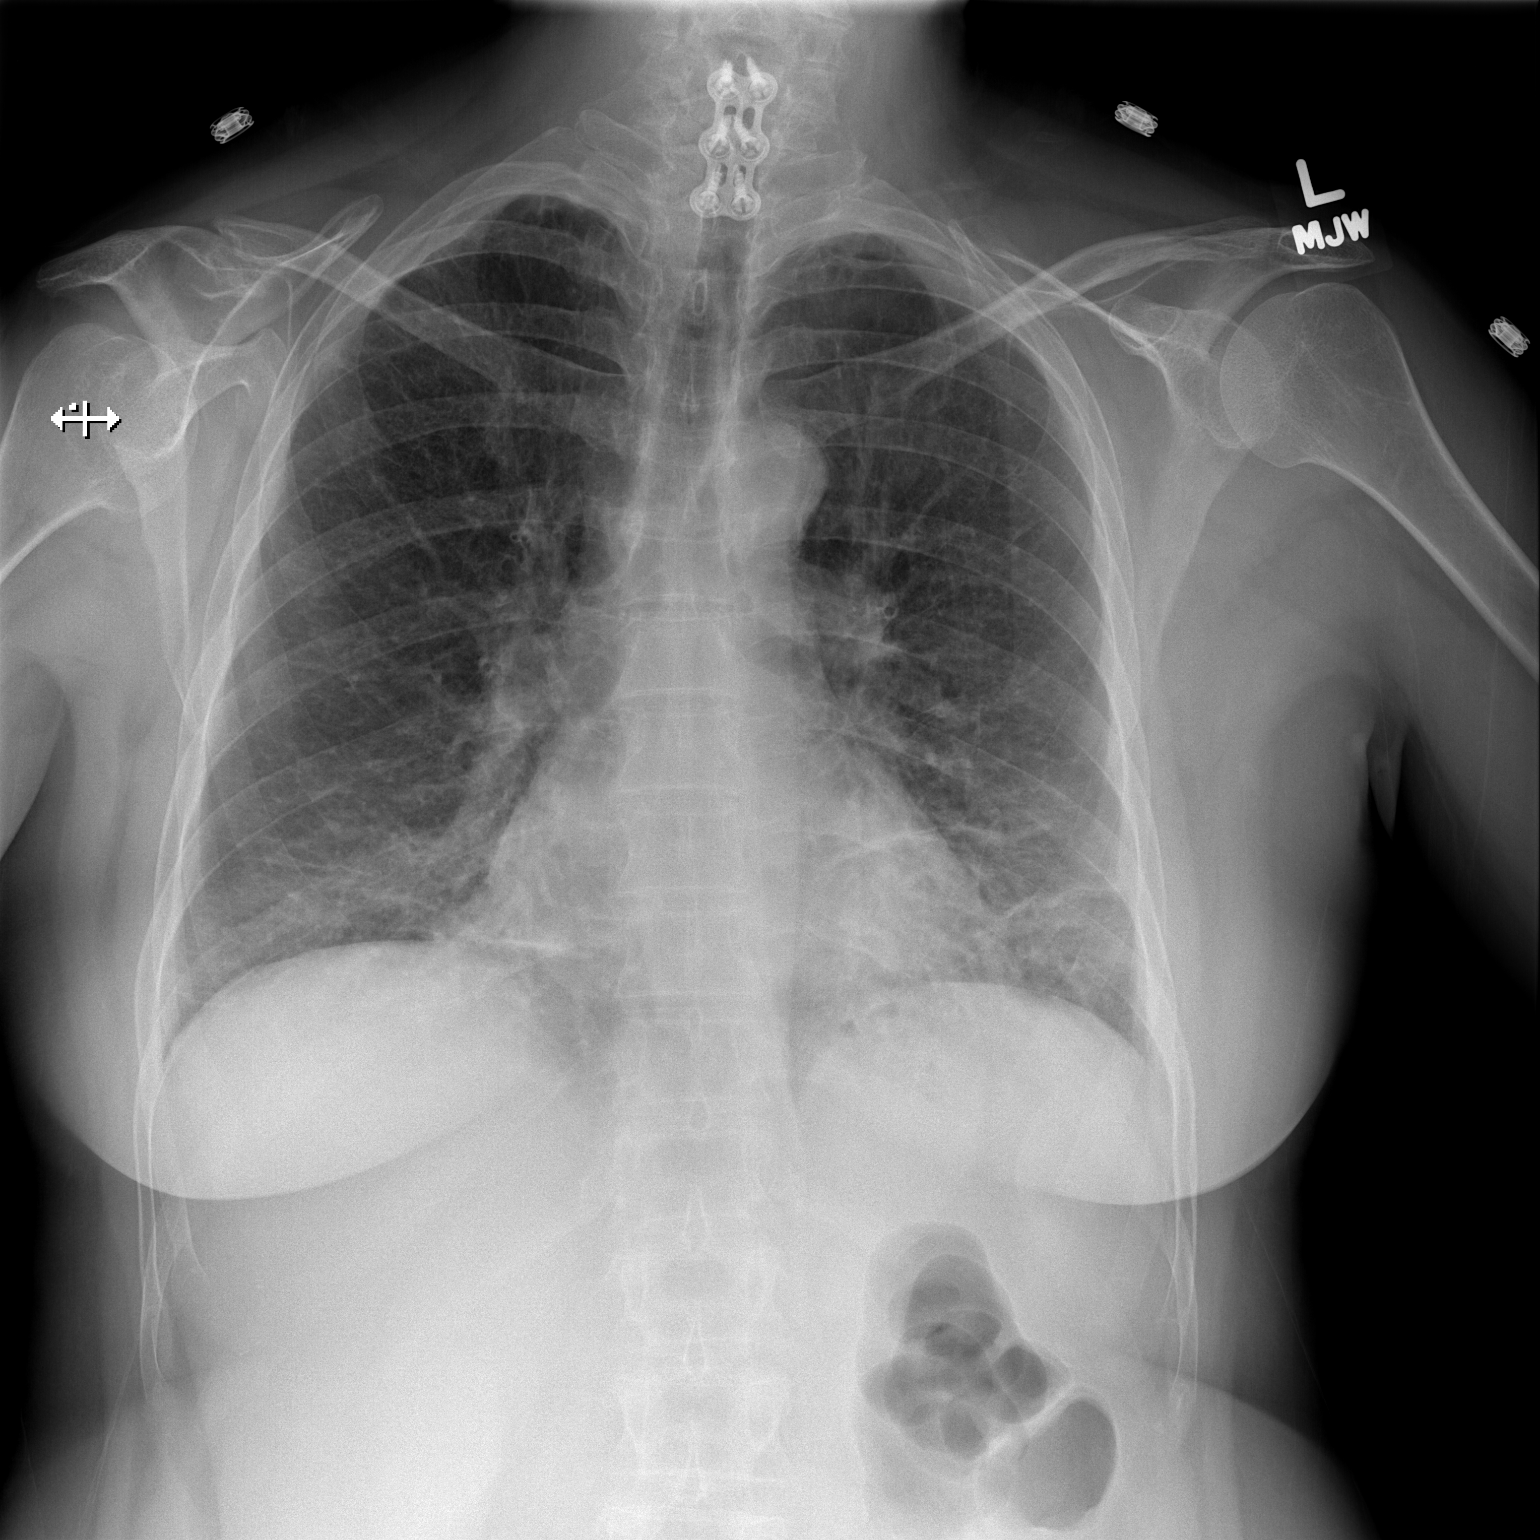

[w chest lat]
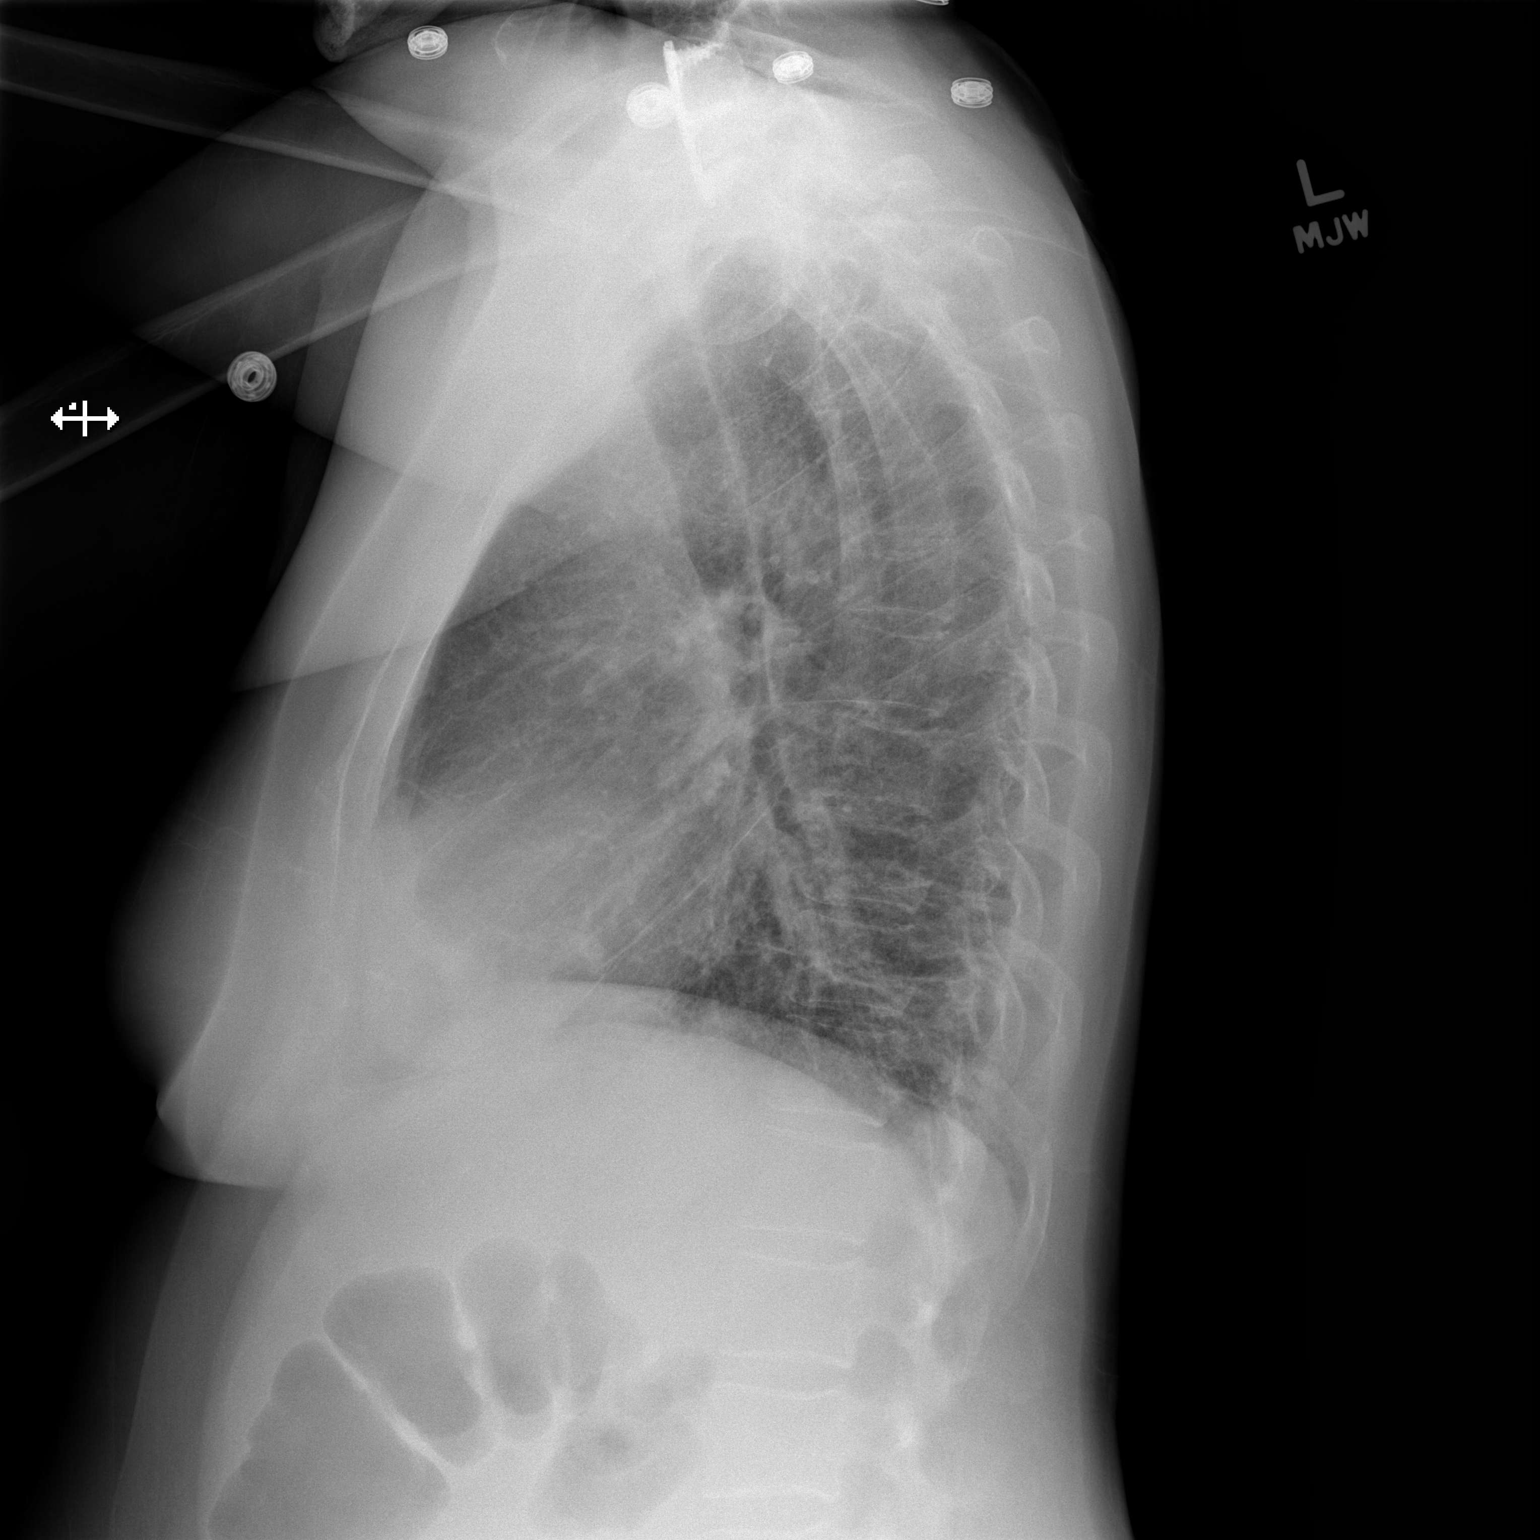

[2 of 2 positions shown; findings below may reference images not displayed]

FINDINGS: Heart size is normal. There is perihilar peribronchial thickening.
Linear densities at the lung bases are again noted, consistent with
atelectasis or scarring. There are no focal consolidations or
pleural effusions. The patient has had previous anterior cervical
fusion.
IMPRESSION: 1. Bronchitic changes.
2. Bibasilar atelectasis or scarring.
3. No new consolidations.

## 2014-02-02 IMAGING — RF DG C-ARM 61-120 MIN
1 series · 1 of 1 positions shown · non-contrast
Comparison: Right shoulder x-ray 03/25/2013.

CLINICAL DATA: ORIF distal clavicle fracture.

EXAM:
RIGHT CLAVICLE - 2+ VIEWS

[Series 1: run · 1 of 1 slices shown]
[im 1/1]
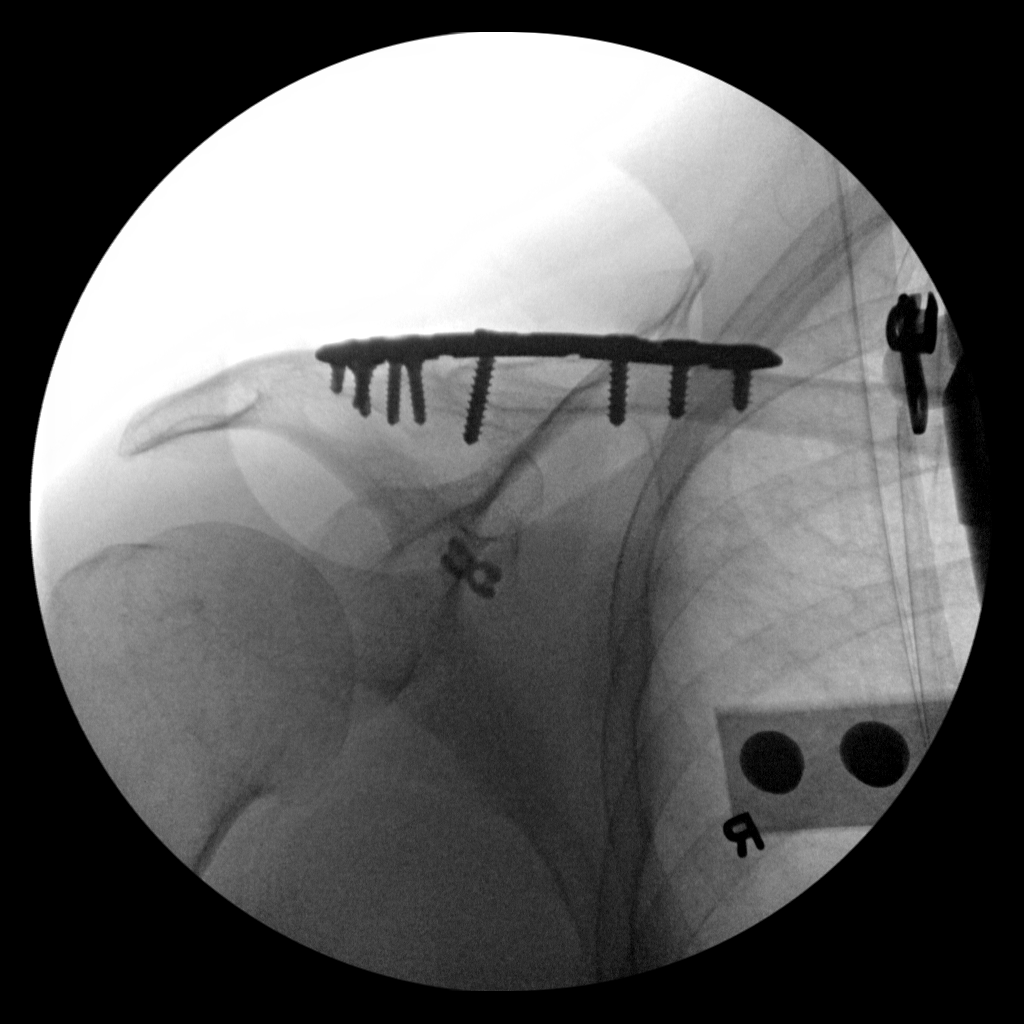

[1 of 1 positions shown; findings below may reference images not displayed]

FINDINGS: ORIF of the distal clavicle fracture with plate and screw fixation.
Anatomic alignment in the AP projection.
IMPRESSION: Anatomic alignment in the AP projection post ORIF of the distal
clavicle fracture.

## 2016-04-25 ENCOUNTER — Other Ambulatory Visit (HOSPITAL_COMMUNITY): Payer: Self-pay | Admitting: Family

## 2016-05-01 ENCOUNTER — Encounter (HOSPITAL_COMMUNITY): Payer: Self-pay | Admitting: *Deleted

## 2016-05-01 MED ORDER — CEFAZOLIN SODIUM-DEXTROSE 2-4 GM/100ML-% IV SOLN
2.0000 g | INTRAVENOUS | Status: AC
  Administered 2016-05-02: 2 g via INTRAVENOUS
  Filled 2016-05-01: qty 100

## 2016-05-02 ENCOUNTER — Ambulatory Visit (HOSPITAL_COMMUNITY): Payer: Medicaid Other | Admitting: Anesthesiology

## 2016-05-02 ENCOUNTER — Encounter (HOSPITAL_COMMUNITY): Payer: Self-pay | Admitting: Certified Registered Nurse Anesthetist

## 2016-05-02 ENCOUNTER — Ambulatory Visit (HOSPITAL_COMMUNITY)
Admission: RE | Admit: 2016-05-02 | Discharge: 2016-05-02 | Disposition: A | Discharge status: Home / Self Care | Payer: Medicaid Other | Source: Ambulatory Visit | Attending: Orthopedic Surgery | Admitting: Orthopedic Surgery

## 2016-05-02 ENCOUNTER — Encounter (HOSPITAL_COMMUNITY)
Admission: RE | Disposition: A | Discharge status: Home / Self Care | Payer: Self-pay | Source: Ambulatory Visit | Attending: Orthopedic Surgery

## 2016-05-02 DIAGNOSIS — Z885 Allergy status to narcotic agent status: Secondary | ICD-10-CM | POA: Insufficient documentation

## 2016-05-02 DIAGNOSIS — M797 Fibromyalgia: Secondary | ICD-10-CM | POA: Diagnosis not present

## 2016-05-02 DIAGNOSIS — Z90710 Acquired absence of both cervix and uterus: Secondary | ICD-10-CM | POA: Diagnosis not present

## 2016-05-02 DIAGNOSIS — K219 Gastro-esophageal reflux disease without esophagitis: Secondary | ICD-10-CM | POA: Diagnosis not present

## 2016-05-02 DIAGNOSIS — F17210 Nicotine dependence, cigarettes, uncomplicated: Secondary | ICD-10-CM | POA: Insufficient documentation

## 2016-05-02 DIAGNOSIS — F410 Panic disorder [episodic paroxysmal anxiety] without agoraphobia: Secondary | ICD-10-CM | POA: Insufficient documentation

## 2016-05-02 DIAGNOSIS — J45909 Unspecified asthma, uncomplicated: Secondary | ICD-10-CM | POA: Diagnosis not present

## 2016-05-02 DIAGNOSIS — Z888 Allergy status to other drugs, medicaments and biological substances status: Secondary | ICD-10-CM | POA: Insufficient documentation

## 2016-05-02 DIAGNOSIS — M12571 Traumatic arthropathy, right ankle and foot: Secondary | ICD-10-CM | POA: Insufficient documentation

## 2016-05-02 DIAGNOSIS — Z886 Allergy status to analgesic agent status: Secondary | ICD-10-CM | POA: Diagnosis not present

## 2016-05-02 DIAGNOSIS — Z981 Arthrodesis status: Secondary | ICD-10-CM | POA: Insufficient documentation

## 2016-05-02 DIAGNOSIS — M2021 Hallux rigidus, right foot: Secondary | ICD-10-CM | POA: Insufficient documentation

## 2016-05-02 DIAGNOSIS — R0609 Other forms of dyspnea: Secondary | ICD-10-CM | POA: Insufficient documentation

## 2016-05-02 DIAGNOSIS — H918X2 Other specified hearing loss, left ear: Secondary | ICD-10-CM | POA: Insufficient documentation

## 2016-05-02 HISTORY — PX: ARTHRODESIS METATARSALPHALANGEAL JOINT (MTPJ): SHX6566

## 2016-05-02 HISTORY — DX: Constipation, unspecified: K59.00

## 2016-05-02 HISTORY — DX: Unspecified convulsions: R56.9

## 2016-05-02 HISTORY — DX: Reserved for inherently not codable concepts without codable children: IMO0001

## 2016-05-02 HISTORY — DX: Fibromyalgia: M79.7

## 2016-05-02 LAB — CBC
HEMATOCRIT: 43.4 % (ref 36.0–46.0)
Hemoglobin: 14 g/dL (ref 12.0–15.0)
MCH: 29.1 pg (ref 26.0–34.0)
MCHC: 32.3 g/dL (ref 30.0–36.0)
MCV: 90.2 fL (ref 78.0–100.0)
PLATELETS: 271 10*3/uL (ref 150–400)
RBC: 4.81 MIL/uL (ref 3.87–5.11)
RDW: 13.6 % (ref 11.5–15.5)
WBC: 7.2 10*3/uL (ref 4.0–10.5)

## 2016-05-02 LAB — SURGICAL PCR SCREEN
MRSA, PCR: NEGATIVE
STAPHYLOCOCCUS AUREUS: POSITIVE — AB

## 2016-05-02 SURGERY — FUSION, JOINT, GREAT TOE
Anesthesia: Regional | Laterality: Right

## 2016-05-02 MED ORDER — ONDANSETRON HCL 4 MG/2ML IJ SOLN
INTRAMUSCULAR | Status: AC
  Filled 2016-05-02: qty 2

## 2016-05-02 MED ORDER — LIDOCAINE HCL (CARDIAC) 20 MG/ML IV SOLN
INTRAVENOUS | Status: DC | PRN
  Administered 2016-05-02: 40 mg via INTRATRACHEAL

## 2016-05-02 MED ORDER — CHLORHEXIDINE GLUCONATE 4 % EX LIQD: 60.0000 mL | Freq: Once | CUTANEOUS | Status: DC

## 2016-05-02 MED ORDER — OXYCODONE HCL 5 MG PO TABS: 10.0000 mg | ORAL_TABLET | Freq: Once | ORAL | Status: DC

## 2016-05-02 MED ORDER — FENTANYL CITRATE (PF) 100 MCG/2ML IJ SOLN
INTRAMUSCULAR | Status: AC
  Administered 2016-05-02: 50 ug via INTRAVENOUS
  Filled 2016-05-02: qty 2

## 2016-05-02 MED ORDER — 0.9 % SODIUM CHLORIDE (POUR BTL) OPTIME
TOPICAL | Status: DC | PRN
  Administered 2016-05-02: 1000 mL

## 2016-05-02 MED ORDER — OXYCODONE-ACETAMINOPHEN 10-325 MG PO TABS: 1.0000 | ORAL_TABLET | ORAL | 0 refills | Status: DC | PRN

## 2016-05-02 MED ORDER — MIDAZOLAM HCL 2 MG/2ML IJ SOLN
INTRAMUSCULAR | Status: DC | PRN
  Administered 2016-05-02 (×2): 1 mg via INTRAVENOUS

## 2016-05-02 MED ORDER — KETAMINE HCL-SODIUM CHLORIDE 100-0.9 MG/10ML-% IV SOSY
PREFILLED_SYRINGE | INTRAVENOUS | Status: AC
  Filled 2016-05-02: qty 10

## 2016-05-02 MED ORDER — OXYCODONE HCL 5 MG PO TABS
ORAL_TABLET | ORAL | Status: AC
  Filled 2016-05-02: qty 2

## 2016-05-02 MED ORDER — ONDANSETRON HCL 4 MG/2ML IJ SOLN
INTRAMUSCULAR | Status: DC | PRN
  Administered 2016-05-02: 4 mg via INTRAVENOUS

## 2016-05-02 MED ORDER — KETAMINE HCL 10 MG/ML IJ SOLN
INTRAMUSCULAR | Status: DC | PRN
  Administered 2016-05-02 (×2): 20 mg via INTRAVENOUS

## 2016-05-02 MED ORDER — FENTANYL CITRATE (PF) 100 MCG/2ML IJ SOLN
25.0000 ug | INTRAMUSCULAR | Status: DC | PRN
  Administered 2016-05-02 (×3): 50 ug via INTRAVENOUS

## 2016-05-02 MED ORDER — BUPIVACAINE-EPINEPHRINE (PF) 0.5% -1:200000 IJ SOLN
INTRAMUSCULAR | Status: DC | PRN
  Administered 2016-05-02: 30 mL via PERINEURAL

## 2016-05-02 MED ORDER — LIDOCAINE 2% (20 MG/ML) 5 ML SYRINGE
INTRAMUSCULAR | Status: AC
  Filled 2016-05-02: qty 5

## 2016-05-02 MED ORDER — FENTANYL CITRATE (PF) 100 MCG/2ML IJ SOLN
INTRAMUSCULAR | Status: AC
  Filled 2016-05-02: qty 4

## 2016-05-02 MED ORDER — LACTATED RINGERS IV SOLN
INTRAVENOUS | Status: DC
  Administered 2016-05-02 (×2): via INTRAVENOUS

## 2016-05-02 MED ORDER — MIDAZOLAM HCL 2 MG/2ML IJ SOLN
INTRAMUSCULAR | Status: AC
  Filled 2016-05-02: qty 2

## 2016-05-02 MED ORDER — FENTANYL CITRATE (PF) 100 MCG/2ML IJ SOLN
INTRAMUSCULAR | Status: DC | PRN
  Administered 2016-05-02 (×2): 50 ug via INTRAVENOUS

## 2016-05-02 MED ORDER — FENTANYL CITRATE (PF) 100 MCG/2ML IJ SOLN
INTRAMUSCULAR | Status: AC
  Filled 2016-05-02: qty 2

## 2016-05-02 MED ORDER — GLYCOPYRROLATE 0.2 MG/ML IJ SOLN
INTRAMUSCULAR | Status: DC | PRN
  Administered 2016-05-02 (×2): 0.1 mg via INTRAVENOUS

## 2016-05-02 MED ORDER — FENTANYL CITRATE (PF) 100 MCG/2ML IJ SOLN
50.0000 ug | Freq: Once | INTRAMUSCULAR | Status: AC
  Administered 2016-05-02: 50 ug via INTRAVENOUS

## 2016-05-02 MED ORDER — PROPOFOL 10 MG/ML IV BOLUS
INTRAVENOUS | Status: AC
  Filled 2016-05-02: qty 20

## 2016-05-02 MED ORDER — GLYCOPYRROLATE 0.2 MG/ML IV SOSY
PREFILLED_SYRINGE | INTRAVENOUS | Status: AC
  Filled 2016-05-02: qty 3

## 2016-05-02 MED ORDER — MUPIROCIN 2 % EX OINT
TOPICAL_OINTMENT | CUTANEOUS | Status: AC
  Filled 2016-05-02: qty 22

## 2016-05-02 MED ORDER — PROMETHAZINE HCL 25 MG/ML IJ SOLN: 6.2500 mg | INTRAMUSCULAR | Status: DC | PRN

## 2016-05-02 MED ORDER — PROPOFOL 10 MG/ML IV BOLUS
INTRAVENOUS | Status: DC | PRN
  Administered 2016-05-02: 150 mg via INTRAVENOUS

## 2016-05-02 SURGICAL SUPPLY — 58 items
BIT DRILL LCP QC 2X140 (BIT) ×2 IMPLANT
BLADE AVERAGE 25MMX9MM (BLADE) ×1
BLADE AVERAGE 25X9 (BLADE) ×1 IMPLANT
BNDG CMPR 9X4 STRL LF SNTH (GAUZE/BANDAGES/DRESSINGS) ×1
BNDG COHESIVE 1X5 TAN STRL LF (GAUZE/BANDAGES/DRESSINGS) IMPLANT
BNDG COHESIVE 6X5 TAN STRL LF (GAUZE/BANDAGES/DRESSINGS) ×2 IMPLANT
BNDG ESMARK 4X9 LF (GAUZE/BANDAGES/DRESSINGS) ×3 IMPLANT
BNDG GAUZE ELAST 4 BULKY (GAUZE/BANDAGES/DRESSINGS) ×2 IMPLANT
BNDG GAUZE STRTCH 6 (GAUZE/BANDAGES/DRESSINGS) IMPLANT
CORDS BIPOLAR (ELECTRODE) ×3 IMPLANT
COTTON STERILE ROLL (GAUZE/BANDAGES/DRESSINGS) IMPLANT
COVER SURGICAL LIGHT HANDLE (MISCELLANEOUS) ×6 IMPLANT
CUFF TOURNIQUET SINGLE 24IN (TOURNIQUET CUFF) IMPLANT
CUFF TOURNIQUET SINGLE 34IN LL (TOURNIQUET CUFF) IMPLANT
CUFF TOURNIQUET SINGLE 44IN (TOURNIQUET CUFF) IMPLANT
DRAPE OEC MINIVIEW 54X84 (DRAPES) IMPLANT
DRAPE U-SHAPE 47X51 STRL (DRAPES) ×3 IMPLANT
DRSG ADAPTIC 3X8 NADH LF (GAUZE/BANDAGES/DRESSINGS) ×2 IMPLANT
DURAPREP 26ML APPLICATOR (WOUND CARE) ×3 IMPLANT
ELECT REM PT RETURN 9FT ADLT (ELECTROSURGICAL) ×3
ELECTRODE REM PT RTRN 9FT ADLT (ELECTROSURGICAL) ×1 IMPLANT
GAUZE SPONGE 2X2 8PLY STRL LF (GAUZE/BANDAGES/DRESSINGS) IMPLANT
GAUZE SPONGE 4X4 12PLY STRL (GAUZE/BANDAGES/DRESSINGS) IMPLANT
GLOVE BIOGEL PI IND STRL 9 (GLOVE) ×1 IMPLANT
GLOVE BIOGEL PI INDICATOR 9 (GLOVE) ×2
GLOVE SURG ORTHO 9.0 STRL STRW (GLOVE) ×3 IMPLANT
GOWN STRL REUS W/ TWL XL LVL3 (GOWN DISPOSABLE) ×2 IMPLANT
GOWN STRL REUS W/TWL XL LVL3 (GOWN DISPOSABLE) ×6
KIT BASIN OR (CUSTOM PROCEDURE TRAY) ×3 IMPLANT
KIT ROOM TURNOVER OR (KITS) ×3 IMPLANT
MANIFOLD NEPTUNE II (INSTRUMENTS) ×3 IMPLANT
NDL HYPO 25GX1X1/2 BEV (NEEDLE) IMPLANT
NEEDLE HYPO 25GX1X1/2 BEV (NEEDLE) IMPLANT
NS IRRIG 1000ML POUR BTL (IV SOLUTION) ×3 IMPLANT
PACK ORTHO EXTREMITY (CUSTOM PROCEDURE TRAY) ×3 IMPLANT
PAD ABD 8X10 STRL (GAUZE/BANDAGES/DRESSINGS) ×2 IMPLANT
PAD ARMBOARD 7.5X6 YLW CONV (MISCELLANEOUS) ×6 IMPLANT
PAD CAST 4YDX4 CTTN HI CHSV (CAST SUPPLIES) IMPLANT
PADDING CAST COTTON 4X4 STRL (CAST SUPPLIES)
PLATE FUSION MTP 42MML (Plate) ×2 IMPLANT
SCREW CORTEX 2.7 SLF-TPNG 16MM (Screw) ×4 IMPLANT
SCREW LOCK VA ST 2.7X18 (Screw) ×4 IMPLANT
SCREW LOCK VA ST 2.7X20 (Screw) ×4 IMPLANT
SPONGE GAUZE 2X2 STER 10/PKG (GAUZE/BANDAGES/DRESSINGS)
SPONGE GAUZE 4X4 12PLY STER LF (GAUZE/BANDAGES/DRESSINGS) ×2 IMPLANT
SUCTION FRAZIER HANDLE 10FR (MISCELLANEOUS)
SUCTION FRAZIER TIP 8 FR DISP (SUCTIONS) ×2
SUCTION TUBE FRAZIER 10FR DISP (MISCELLANEOUS) IMPLANT
SUCTION TUBE FRAZIER 8FR DISP (SUCTIONS) IMPLANT
SUT ETHILON 2 0 PSLX (SUTURE) ×8 IMPLANT
SUT VIC AB 2-0 CT1 27 (SUTURE) ×6
SUT VIC AB 2-0 CT1 TAPERPNT 27 (SUTURE) ×1 IMPLANT
SYR CONTROL 10ML LL (SYRINGE) IMPLANT
TOWEL OR 17X24 6PK STRL BLUE (TOWEL DISPOSABLE) ×3 IMPLANT
TOWEL OR 17X26 10 PK STRL BLUE (TOWEL DISPOSABLE) ×3 IMPLANT
TUBE CONNECTING 12'X1/4 (SUCTIONS)
TUBE CONNECTING 12X1/4 (SUCTIONS) IMPLANT
WATER STERILE IRR 1000ML POUR (IV SOLUTION) ×3 IMPLANT

## 2016-05-02 NOTE — H&P (Signed)
Susan Short is an 45 y.o. female.   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 She has pain with activities of daily living and presents at this time for MTP fusion.  Past Medical History:  Diagnosis Date  . Asthma   . Constipation   . Deaf    left ear  . Difficult intubation    Patient reports that they had to use a small tube; MAC3, grade 1 view, 1 attempt, 7.0 ETT on 05/11/09  . Fibromyalgia   . GERD (gastroesophageal reflux disease)   . Hard of hearing    right  . Panic attack    Panic  . Seizures ([place])    "due to Tramadol"  . Shortness of breath dyspnea    with exertion    Past Surgical History:  Procedure Laterality Date  . ABDOMINAL HYSTERECTOMY    . APPENDECTOMY     as child  . CERVICAL FUSION    . CESAREAN SECTION     x 2  . ORIF CLAVICULAR FRACTURE Right 05/05/2013   Procedure: OPEN REDUCTION INTERNAL FIXATION (ORIF) CLAVICULAR FRACTURE- right;  Surgeon: Meredith Pel, MD;  Location: [facility];  Service: Orthopedics;  Laterality: Right;  Right Clavicle Fracture Non Union Takedown and Fixation with Bone Graft, Coracoclavicular Ligament Reconstruction with Tight Rope.  Marland Kitchen SHOULDER SURGERY Right    x 3- fracture- screws,   . thumb operation    . TONSILLECTOMY AND ADENOIDECTOMY     as a child    History reviewed. No pertinent family history. Social History:  reports that she has been smoking.  She has a 30.00 pack-year smoking history. She has never used smokeless tobacco. She reports that she does not drink alcohol or use drugs.  Allergies:  Allergies  Allergen Reactions  . Ketorolac Tromethamine Hives  . Nsaids Hives  . Tramadol     seizure   . Ibuprofen Hives and Rash    No prescriptions prior to admission.    No results found for this or any previous visit (from the past 48 hour(s)). No results found.  Review of Systems  All other systems reviewed and  are negative.   There were no vitals taken for this visit. Physical Exam  On examination patient has a palpable dorsalis pedis pulse she has dorsiflexion about 20 with osteophytic bone spurs the MTP joint. Radiographs shows joint space narrowing with osteophytic bone spurs. 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  Plan: We'll plan for fusion of the MTP joint. Risk and benefits were discussed patient states she understands wish to proceed at this time.  Newt Minion, MD 05/02/2016, 6:35 AM

## 2016-05-02 NOTE — Anesthesia Preprocedure Evaluation (Addendum)
Anesthesia Evaluation  Patient identified by MRN, date of birth, ID band Patient awake    Reviewed: Allergy & Precautions, H&P , NPO status , Patient's Chart, lab work & pertinent test results  History of Anesthesia Complications (+) DIFFICULT AIRWAY and history of anesthetic complications (Patient reports that they had to use a small tube; MAC3, grade 1 view, 1 attempt, 7.0 ETT on 05/11/09; Grade 1 view with Sabra Heck 2 and 7.80mm ETT on 9/14)  Airway Mallampati: II  TM Distance: <3 FB Neck ROM: Full    Dental  (+) Dental Advisory Given, Edentulous Upper, Edentulous Lower   Pulmonary asthma , Current Smoker,    Pulmonary exam normal breath sounds clear to auscultation       Cardiovascular negative cardio ROS   Rhythm:Regular Rate:Normal     Neuro/Psych Seizures -,  PSYCHIATRIC DISORDERS Anxiety Deaf left ear    GI/Hepatic Neg liver ROS, GERD  Medicated and Controlled,  Endo/Other  negative endocrine ROS  Renal/GU negative Renal ROS     Musculoskeletal  (+) Fibromyalgia -  Abdominal   Peds  Hematology negative hematology ROS (+)   Anesthesia Other Findings Day of surgery medications reviewed with the patient.  Reproductive/Obstetrics                            Anesthesia Physical  Anesthesia Plan  ASA: III  Anesthesia Plan: General and Regional   Post-op Pain Management:  Regional for Post-op pain   Induction: Intravenous  Airway Management Planned: LMA  Additional Equipment:   Intra-op Plan:   Post-operative Plan: Extubation in OR  Informed Consent: I have reviewed the patients History and Physical, chart, labs and discussed the procedure including the risks, benefits and alternatives for the proposed anesthesia with the patient or authorized representative who has indicated his/her understanding and acceptance.   Dental advisory given  Plan Discussed with: CRNA and  Surgeon  Anesthesia Plan Comments: (Risks/benefits of general anesthesia discussed with patient including risk of damage to teeth, lips, gum, and tongue, nausea/vomiting, allergic reactions to medications, and the possibility of heart attack, stroke and death.  All patient questions answered.  Patient wishes to proceed.)       Anesthesia Quick Evaluation

## 2016-05-02 NOTE — Transfer of Care (Signed)
Immediate Anesthesia Transfer of Care Note  Patient: Susan Short  Procedure(s) Performed: Procedure(s): ARTHRODESIS METATARSALPHALANGEAL JOINT (MTPJ) RIGHT GREAT TOE (Right)  Patient Location: PACU  Anesthesia Type:General and Regional  Level of Consciousness: awake, alert , oriented and patient cooperative  Airway & Oxygen Therapy: Patient Spontanous Breathing and Patient connected to nasal cannula oxygen  Post-op Assessment: Report given to RN, Post -op Vital signs reviewed and stable and Patient moving all extremities  Post vital signs: Reviewed and stable  Last Vitals:  Vitals:   05/02/16 0850  BP: (!) 114/59  Pulse: 71  Resp: 18  Temp: 36.7 C    Last Pain:  Vitals:   05/02/16 0928  TempSrc:   PainSc: 10-Worst pain ever      Patients Stated Pain Goal: 5 ([id number])  Complications: No apparent anesthesia complications

## 2016-05-02 NOTE — Anesthesia Procedure Notes (Signed)
Anesthesia Regional Block:  Popliteal block  Pre-Anesthetic Checklist: ,, timeout performed, Correct Patient, Correct Site, Correct Laterality, Correct Procedure, Correct Position, site marked, Risks and benefits discussed,  Surgical consent,  Pre-op evaluation,  At surgeon's request and post-op pain management  Laterality: Right  Prep: chloraprep       Needles:  Injection technique: Single-shot  Needle Type: Echogenic Needle     Needle Length: 9cm 9 cm Needle Gauge: 21 and 21 G    Additional Needles:  Procedures: ultrasound guided (picture in chart) Popliteal block Narrative:  Injection made incrementally with aspirations every 5 mL.  Performed by: Personally  Anesthesiologist: Catalina Gravel  Additional Notes: No pain on injection. No increased resistance to injection. Injection made in 5cc increments.  Good needle visualization.  Patient tolerated procedure well.  Right popliteal/saphenous nerve block.

## 2016-05-02 NOTE — Anesthesia Procedure Notes (Signed)
Procedure Name: LMA Insertion Date/Time: 05/02/2016 10:37 AM Performed by: Catalina Gravel Pre-anesthesia Checklist: Patient identified, Emergency Drugs available, Suction available and Patient being monitored Patient Re-evaluated:Patient Re-evaluated prior to inductionOxygen Delivery Method: Circle system utilized Preoxygenation: Pre-oxygenation with 100% oxygen Intubation Type: IV induction LMA: LMA inserted LMA Size: 3.0 Tube type: Oral Number of attempts: 1 Placement Confirmation: positive ETCO2 and breath sounds checked- equal and bilateral Tube secured with: Tape Dental Injury: Teeth and Oropharynx as per pre-operative assessment

## 2016-05-02 NOTE — OR Nursing (Signed)
Dr. Gifford Shave updated to pt's assessment, vss, no physiological signs of distress, sleeps easily with unaffected affect when awake. Pt approved for d/c without further pharmacological analgesic intervention.

## 2016-05-02 NOTE — Op Note (Signed)
05/02/2016  11:07 AM  PATIENT:  Susan Short    PRE-OPERATIVE DIAGNOSIS:  Hallux Rigidus Right Foot  POST-OPERATIVE DIAGNOSIS:  Same  PROCEDURE:  ARTHRODESIS METATARSALPHALANGEAL JOINT (MTPJ) RIGHT GREAT TOE  SURGEON:  Newt Minion, MD  PHYSICIAN ASSISTANT:None ANESTHESIA:   General  PREOPERATIVE INDICATIONS:  [name] is a  45 y.o. female with a diagnosis of Hallux Rigidus Right Foot who failed conservative measures and elected for surgical management.    The risks benefits and alternatives were discussed with the patient preoperatively including but not limited to the risks of infection, bleeding, nerve injury, cardiopulmonary complications, the need for revision surgery, among others, and the patient was willing to proceed.  OPERATIVE IMPLANTS: Synthes 5 dorsal MTP fusion plate  OPERATIVE FINDINGS: Large osteophytic bone spurs  OPERATIVE PROCEDURE: Patient brought the operating room after undergoing a regional block she then underwent a general anesthetic. After adequate levels anesthesia obtained patient's right lower extremity was prepped using DuraPrep draped into a sterile field a timeout was called. A medial incision was made over the MTP joint this was carried down to the retinaculum which was elevated. Osteophytic bone spurs were removed with a Ronjair. Using an IM guidewire the 20 mm cup reamer was used to ream the first metatarsal head osteophytic bone spurs removed with a Ronjair. Using the guidewire the cone reamer was used 20 mm on the base of proximal phalanx both were reamed down to bleeding subchondral bone. The joint was reduced space of 2 mm was allowed between the great toe and second toe. The plate was applied dorsally locked proximally and distally and then 2 locking screws were also placed proximally and distally for a total of 6 cortices proximal and distal fixation. The wound was irrigated with normal saline and the retinaculum was closed using 2-0 Vicryl  the skin was closed using 2-0 nylon a sterile compressive dressing was applied patient was extubated taken to the PACU in stable condition.

## 2016-05-02 NOTE — Anesthesia Procedure Notes (Signed)
Performed by: TURK, STEPHEN EDWARD       

## 2016-05-02 NOTE — Progress Notes (Signed)
Orthopedic Tech Progress Note Patient Details:  Susan Short 11/19/1970 [id number]  Ortho Devices Type of Ortho Device: Crutches, Postop shoe/boot Ortho Device/Splint Location: rle Ortho Device/Splint Interventions: Application   Brenna Friesenhahn 05/02/2016, 12:45 PM Viewed order from doctor's order list

## 2016-05-03 NOTE — Anesthesia Postprocedure Evaluation (Signed)
Anesthesia Post Note  Patient: Susan Short  Procedure(s) Performed: Procedure(s) (LRB): ARTHRODESIS METATARSALPHALANGEAL JOINT (MTPJ) RIGHT GREAT TOE (Right)  Patient location during evaluation: PACU Anesthesia Type: General Level of consciousness: awake and alert Pain management: pain level controlled Vital Signs Assessment: post-procedure vital signs reviewed and stable Respiratory status: spontaneous breathing, nonlabored ventilation and respiratory function stable Cardiovascular status: blood pressure returned to baseline and stable Postop Assessment: no signs of nausea or vomiting Anesthetic complications: no    Last Vitals:  Vitals:   05/02/16 1215 05/02/16 1229  BP: 94/67 102/74  Pulse: 69   Resp: 10 16  Temp: 36.6 C     Last Pain:  Vitals:   05/02/16 1229  TempSrc:   PainSc: 8                  Resa Rinks A

## 2016-05-07 ENCOUNTER — Encounter (HOSPITAL_COMMUNITY): Payer: Self-pay | Admitting: Orthopedic Surgery

## 2016-05-21 ENCOUNTER — Ambulatory Visit (INDEPENDENT_AMBULATORY_CARE_PROVIDER_SITE_OTHER): Payer: Self-pay | Admitting: Orthopedic Surgery

## 2016-05-24 ENCOUNTER — Ambulatory Visit (INDEPENDENT_AMBULATORY_CARE_PROVIDER_SITE_OTHER): Payer: Medicaid Other | Admitting: Orthopedic Surgery

## 2016-05-24 ENCOUNTER — Encounter (INDEPENDENT_AMBULATORY_CARE_PROVIDER_SITE_OTHER): Payer: Self-pay

## 2016-05-24 DIAGNOSIS — M2021 Hallux rigidus, right foot: Secondary | ICD-10-CM

## 2016-06-01 ENCOUNTER — Ambulatory Visit (INDEPENDENT_AMBULATORY_CARE_PROVIDER_SITE_OTHER): Payer: Medicaid Other | Admitting: Family

## 2016-06-04 ENCOUNTER — Encounter (INDEPENDENT_AMBULATORY_CARE_PROVIDER_SITE_OTHER): Payer: Self-pay

## 2016-06-05 ENCOUNTER — Telehealth (INDEPENDENT_AMBULATORY_CARE_PROVIDER_SITE_OTHER): Payer: Self-pay

## 2016-06-05 NOTE — Progress Notes (Signed)
Erroneous encounter

## 2016-06-05 NOTE — Telephone Encounter (Signed)
[place] track prior auth for endocet faxed to [phone]. Will hold message pending approval.

## 2016-06-06 ENCOUNTER — Ambulatory Visit (INDEPENDENT_AMBULATORY_CARE_PROVIDER_SITE_OTHER): Payer: Medicaid Other | Admitting: Family

## 2016-06-07 ENCOUNTER — Ambulatory Visit (INDEPENDENT_AMBULATORY_CARE_PROVIDER_SITE_OTHER): Payer: Medicaid Other | Admitting: Family

## 2016-06-07 NOTE — Telephone Encounter (Signed)
Called [facility] [phone] and this medication has been approved for the pt till the end of the year . Pharm can run

## 2016-06-08 ENCOUNTER — Ambulatory Visit (INDEPENDENT_AMBULATORY_CARE_PROVIDER_SITE_OTHER): Payer: Medicaid Other | Admitting: Family

## 2016-06-15 ENCOUNTER — Ambulatory Visit (INDEPENDENT_AMBULATORY_CARE_PROVIDER_SITE_OTHER): Payer: Medicaid Other

## 2016-06-15 ENCOUNTER — Ambulatory Visit (INDEPENDENT_AMBULATORY_CARE_PROVIDER_SITE_OTHER): Payer: Medicaid Other | Admitting: Orthopedic Surgery

## 2016-06-15 ENCOUNTER — Encounter (INDEPENDENT_AMBULATORY_CARE_PROVIDER_SITE_OTHER): Payer: Self-pay | Admitting: Orthopedic Surgery

## 2016-06-15 VITALS — Ht 66.0 in

## 2016-06-15 DIAGNOSIS — M79671 Pain in right foot: Secondary | ICD-10-CM

## 2016-06-15 NOTE — Progress Notes (Signed)
Office Visit Note   Patient: Susan Short           Date of Birth: 1971-04-07           MRN: [id number] Visit Date: 06/15/2016              Requested by: Antonietta Jewel, MD [address] [place], [place] [address] PCP: Antonietta Jewel, MD   Assessment & Plan: Visit Diagnoses: No diagnosis found.  Plan: Discussed with the patient through the interpreter and her mother the importance of smoking cessation. Patient is still smoking. Recommended vitamin D3 5000 international units a day. Patient has been walking barefoot and with crocs shoes she has not been wearing her postoperative shoe. Patient's foot is dirty from walking barefoot. Patient states she has had a temperature at night of 100.1 but there is no clinical signs of cellulitis no open wound no drainage. We will get her fracture boot follow-up in 4 weeks. Discussed that if we are not showing improvement we would need to consider removal of the deep retained hardware at follow-up.  Follow-Up Instructions: No Follow-up on file.   Orders:  No orders of the defined types were placed in this encounter.  No orders of the defined types were placed in this encounter.     Procedures: No procedures performed   Clinical Data: No additional findings.   Subjective: Chief Complaint  Patient presents with  . Right Foot - Routine Post Op    05/02/16 right GT MTP fusion    Pt is 6 weeks s/p a right GT MTP fusion. The pt's toe is swollen and red. The pt is full weight bearing in crocks and not her post op shoe despite being advised otherwise. The pt continues to smoke as well. She reports that its " not as much" and that she "vapes" . Patient c/o of her foot turning outwards and that she is unsteady because of the foot rolling with weight bearing.     Review of Systems   Objective: Vital Signs: Ht 5\' 6"  (1.676 m)   Physical Exam on examination patient has swelling around the great toe there is some mild dermatitis but there is  cellulitis has resolved she has completed her course of antibiotics. There is no drainage no clinical signs of infection. Patient has been extremely noncompliant and after repeated discussions the patient does not seem to understand the importance of compliance. The importance of smoking cessation was again discussed the importance of using protected shoe wear. We will get her fracture boot.  Ortho Exam  Specialty Comments:  No specialty comments available.  Imaging: No results found.   PMFS History: Patient Active Problem List   Diagnosis Date Noted  . DECREASED HEARING 10/13/2010  . ALLERGIC RHINITIS 10/13/2010  . PULMONARY NODULE 10/13/2010  . NEOPLASM, BENIGN, THYROID 09/22/2010  . HYPERLIPIDEMIA 09/22/2010   Past Medical History:  Diagnosis Date  . Asthma   . Constipation   . Deaf    left ear  . Difficult intubation    Patient reports that they had to use a small tube; MAC3, grade 1 view, 1 attempt, 7.0 ETT on 05/11/09  . Fibromyalgia   . GERD (gastroesophageal reflux disease)   . Hard of hearing    right  . Panic attack    Panic  . Seizures ([place])    "due to Tramadol"  . Shortness of breath dyspnea    with exertion    History reviewed. No pertinent family history.  Past Surgical History:  Procedure Laterality Date  . ABDOMINAL HYSTERECTOMY    . APPENDECTOMY     as child  . ARTHRODESIS METATARSALPHALANGEAL JOINT (MTPJ) Right 05/02/2016   Procedure: ARTHRODESIS METATARSALPHALANGEAL JOINT (MTPJ) RIGHT GREAT TOE;  Surgeon: Newt Minion, MD;  Location: [facility];  Service: Orthopedics;  Laterality: Right;  . CERVICAL FUSION    . CESAREAN SECTION     x 2  . ORIF CLAVICULAR FRACTURE Right 05/05/2013   Procedure: OPEN REDUCTION INTERNAL FIXATION (ORIF) CLAVICULAR FRACTURE- right;  Surgeon: Meredith Pel, MD;  Location: [facility];  Service: Orthopedics;  Laterality: Right;  Right Clavicle Fracture Non Union Takedown and Fixation with Bone Graft, Coracoclavicular Ligament  Reconstruction with Tight Rope.  Marland Kitchen SHOULDER SURGERY Right    x 3- fracture- screws,   . thumb operation    . TONSILLECTOMY AND ADENOIDECTOMY     as a child   Social History   Occupational History  . Not on file.   Social History Main Topics  . Smoking status: Current Every Day Smoker    Packs/day: 1.00    Years: 30.00  . Smokeless tobacco: Never Used  . Alcohol use No  . Drug use: No  . Sexual activity: Not on file

## 2016-07-09 ENCOUNTER — Ambulatory Visit (INDEPENDENT_AMBULATORY_CARE_PROVIDER_SITE_OTHER): Payer: Medicaid Other | Admitting: Orthopedic Surgery

## 2016-07-09 ENCOUNTER — Encounter (INDEPENDENT_AMBULATORY_CARE_PROVIDER_SITE_OTHER): Payer: Self-pay | Admitting: Orthopedic Surgery

## 2016-07-09 ENCOUNTER — Ambulatory Visit (INDEPENDENT_AMBULATORY_CARE_PROVIDER_SITE_OTHER): Payer: Medicaid Other

## 2016-07-09 VITALS — Ht 63.0 in | Wt 150.0 lb

## 2016-07-09 DIAGNOSIS — Q7021 Fused toes, right foot: Secondary | ICD-10-CM | POA: Diagnosis not present

## 2016-07-09 MED ORDER — OXYCODONE-ACETAMINOPHEN 10-325 MG PO TABS: 1.0000 | ORAL_TABLET | ORAL | 0 refills | Status: DC | PRN

## 2016-07-09 MED ORDER — PREGABALIN 100 MG PO CAPS: 100.0000 mg | ORAL_CAPSULE | Freq: Three times a day (TID) | ORAL | 0 refills | Status: DC

## 2016-07-09 NOTE — Progress Notes (Signed)
Office Visit Note   Patient: Susan Short           Date of Birth: [birth year]           MRN: [id number] Visit Date: 07/09/2016              Requested by: Antonietta Jewel, MD [address], [place], [place] [address] PCP: Antonietta Jewel, MD   Assessment & Plan: Visit Diagnoses:  1. Fusion of toes of right foot     Plan: Patient is having persistent pain. There is no redness or cellulitis no signs of infection. Discussed the importance of smoking cessation. Follow-up in 4 weeks. Repeat radiographs of follow-up if not showing improvement will need to consider revision internal fixation. Discussed the importance of smoking cessation and discussed the importance of nonweightbearing. Her Percocet was refilled and her Lyrica was refilled.  Follow-Up Instructions: Return in about 4 weeks (around 08/06/2016).   Orders:  Orders Placed This Encounter  Procedures  . XR Foot Complete Right   Meds ordered this encounter  Medications  . oxyCODONE-acetaminophen (PERCOCET) 10-325 MG tablet    Sig: Take 1 tablet by mouth every 4 (four) hours as needed for pain.    Dispense:  60 tablet    Refill:  0  . pregabalin (LYRICA) 100 MG capsule    Sig: Take 1 capsule (100 mg total) by mouth 3 (three) times daily.    Dispense:  90 capsule    Refill:  0      Procedures: No procedures performed   Clinical Data: No additional findings.   Subjective: Chief Complaint  Patient presents with  . Right Foot - Follow-up, Pain    Patient returns 10 weeks s/p right GT MTP fusion. States still very sore. Ambulates with Cam walker. Trouble walking.     Review of Systems   Objective: Vital Signs: Ht 5\' 3"  (1.6 m)   Wt 150 lb (68 kg)   BMI 26.57 kg/m   Physical Exam examination patient has a palpable dorsalis pedis pulse there is no redness no cellulitis there is swelling around the great toe MTP joint. Patient does have sensitivity to light touch but there are no dystrophic changes in her  foot.  Ortho Exam  Specialty Comments:  No specialty comments available.  Imaging: Xr Foot Complete Right  Result Date: 07/09/2016 Three-view radiographs of the right foot shows a fibrous delayed union of the great toe MTP joint fusion. There does appear to be some loosening of the screws.    PMFS History: Patient Active Problem List   Diagnosis Date Noted  . DECREASED HEARING 10/13/2010  . ALLERGIC RHINITIS 10/13/2010  . PULMONARY NODULE 10/13/2010  . NEOPLASM, BENIGN, THYROID 09/22/2010  . HYPERLIPIDEMIA 09/22/2010   Past Medical History:  Diagnosis Date  . Asthma   . Constipation   . Deaf    left ear  . Difficult intubation    Patient reports that they had to use a small tube; MAC3, grade 1 view, 1 attempt, 7.0 ETT on 05/11/09  . Fibromyalgia   . GERD (gastroesophageal reflux disease)   . Hard of hearing    right  . Panic attack    Panic  . Seizures ([place])    "due to Tramadol"  . Shortness of breath dyspnea    with exertion    No family history on file.  Past Surgical History:  Procedure Laterality Date  . ABDOMINAL HYSTERECTOMY    . APPENDECTOMY     as  child  . ARTHRODESIS METATARSALPHALANGEAL JOINT (MTPJ) Right 05/02/2016   Procedure: ARTHRODESIS METATARSALPHALANGEAL JOINT (MTPJ) RIGHT GREAT TOE;  Surgeon: Newt Minion, MD;  Location: [facility];  Service: Orthopedics;  Laterality: Right;  . CERVICAL FUSION    . CESAREAN SECTION     x 2  . ORIF CLAVICULAR FRACTURE Right 05/05/2013   Procedure: OPEN REDUCTION INTERNAL FIXATION (ORIF) CLAVICULAR FRACTURE- right;  Surgeon: Meredith Pel, MD;  Location: [facility];  Service: Orthopedics;  Laterality: Right;  Right Clavicle Fracture Non Union Takedown and Fixation with Bone Graft, Coracoclavicular Ligament Reconstruction with Tight Rope.  Marland Kitchen SHOULDER SURGERY Right    x 3- fracture- screws,   . thumb operation    . TONSILLECTOMY AND ADENOIDECTOMY     as a child   Social History   Occupational History  . Not on  file.   Social History Main Topics  . Smoking status: Current Every Day Smoker    Packs/day: 1.00    Years: 30.00  . Smokeless tobacco: Never Used  . Alcohol use No  . Drug use: No  . Sexual activity: Not on file

## 2016-07-12 ENCOUNTER — Telehealth (INDEPENDENT_AMBULATORY_CARE_PROVIDER_SITE_OTHER): Payer: Self-pay | Admitting: *Deleted

## 2016-07-12 ENCOUNTER — Telehealth (INDEPENDENT_AMBULATORY_CARE_PROVIDER_SITE_OTHER): Payer: Self-pay | Admitting: Orthopedic Surgery

## 2016-07-12 NOTE — Telephone Encounter (Signed)
Pt mother called stating pt is still in pain and is needing something stronger.

## 2016-07-12 NOTE — Telephone Encounter (Signed)
Pt had just been given a rx for percocet and lyrica on 07/09/16 it was discussed taht she may need additional surgery. To come in appt sch for tomorrow.

## 2016-07-12 NOTE — Telephone Encounter (Signed)
Pt has an appt tomorrow °

## 2016-07-12 NOTE — Telephone Encounter (Signed)
[name] called on behalf of her mother, Ms. Sherral Hammers, saying the Rx of Pregabalin isn't working. She's wondering if something else can be prescribed for her mother. Please give her a call regarding this.  Linda's ph# [phone] Thank you.

## 2016-07-12 NOTE — Telephone Encounter (Signed)
Patients mother calling, patient taking lyrica w/out relief. Questions if another medication can be called in. Please advise.

## 2016-07-13 ENCOUNTER — Ambulatory Visit (INDEPENDENT_AMBULATORY_CARE_PROVIDER_SITE_OTHER): Payer: Medicaid Other | Admitting: Family

## 2016-07-13 ENCOUNTER — Ambulatory Visit (INDEPENDENT_AMBULATORY_CARE_PROVIDER_SITE_OTHER): Payer: Medicaid Other | Admitting: Orthopedic Surgery

## 2016-07-13 DIAGNOSIS — S92901K Unspecified fracture of right foot, subsequent encounter for fracture with nonunion: Secondary | ICD-10-CM

## 2016-07-13 MED ORDER — CHOLECALCIFEROL 1.25 MG (50000 UT) PO TABS: 1.0000 | ORAL_TABLET | ORAL | 0 refills | Status: DC

## 2016-07-13 MED ORDER — OXYCODONE-ACETAMINOPHEN 10-325 MG PO TABS: 1.0000 | ORAL_TABLET | ORAL | 0 refills | Status: DC | PRN

## 2016-07-13 NOTE — Progress Notes (Signed)
Office Visit Note   Patient: Susan Short           Date of Birth: 06-22-1971           MRN: [id number] Visit Date: 07/13/2016              Requested by: Antonietta Jewel, MD [address] [place], [place] [address] PCP: Antonietta Jewel, MD   Assessment & Plan: Visit Diagnoses:  1. Nonunion of foot fracture, right     Plan: Patient is symptomatic from her nonunion. Discussed that we could proceed with removal of the internal fixation with revision internal fixation with staples. Discussed the importance of smoking cessation that this will not heal with her continued smoking this was repeated and patient states she understands. This was also discussed with her mother as well. Discussed the importance of starting vitamin D3. We will refill her prescription for Percocet discussed the importance of nonweightbearing patient is currently full weightbearing with her fracture boot she states she cannot understand how to use a walker. Recommend that she only get up to go the bathroom and otherwise be non-ambulatory and have her mother prepare her meals for her. We'll follow up in the office in 2 weeks for plan for surgery next week. Discussed increased risk of infection increased risk of skin not healing with surgery.  Follow-Up Instructions: Return in about 2 weeks (around 07/27/2016).   Orders:  No orders of the defined types were placed in this encounter.  Meds ordered this encounter  Medications  . oxyCODONE-acetaminophen (PERCOCET) 10-325 MG tablet    Sig: Take 1 tablet by mouth every 4 (four) hours as needed for pain.    Dispense:  30 tablet    Refill:  0      Procedures: No procedures performed   Clinical Data: No additional findings.   Subjective: No chief complaint on file.   Patient returns 10 weeks s/p right GT MTP fusion. Ambulates with Cam walker. Trouble walking. Patient states that she is having burning pain and that it is constant and that she does not have relief  " ever". She was here on Monday she was given an rx for Percocet 10/325 and Lyrica 100 mg and she states that she is almost out of the medication. She is taking Perocet 10/325 2 q 4 hrs ( not as advised on the rx) she states that nothing helps not conservative treatment and not medication. The toe is swollen and the icision is healed she is full weight bearing on this foot. She is still smoking but states that she has cut back a lot.    Review of Systems   Objective: Vital Signs: There were no vitals taken for this visit.  Physical Exam on examination patient is alert oriented no adenopathy well-dressed normal affect she is full weightbearing on her foot she states that she is not putting much weight on her foot. Examination she does have some swelling but there is no redness no cellulitis she does have tenderness to palpation she has good pulses. Radiographs show a fibrous union.  Ortho Exam  Specialty Comments:  No specialty comments available.  Imaging: No results found.   PMFS History: Patient Active Problem List   Diagnosis Date Noted  . Nonunion of foot fracture, right 07/13/2016  . DECREASED HEARING 10/13/2010  . ALLERGIC RHINITIS 10/13/2010  . PULMONARY NODULE 10/13/2010  . NEOPLASM, BENIGN, THYROID 09/22/2010  . HYPERLIPIDEMIA 09/22/2010   Past Medical History:  Diagnosis Date  .  Asthma   . Constipation   . Deaf    left ear  . Difficult intubation    Patient reports that they had to use a small tube; MAC3, grade 1 view, 1 attempt, 7.0 ETT on 05/11/09  . Fibromyalgia   . GERD (gastroesophageal reflux disease)   . Hard of hearing    right  . Panic attack    Panic  . Seizures ([place])    "due to Tramadol"  . Shortness of breath dyspnea    with exertion    No family history on file.  Past Surgical History:  Procedure Laterality Date  . ABDOMINAL HYSTERECTOMY    . APPENDECTOMY     as child  . ARTHRODESIS METATARSALPHALANGEAL JOINT (MTPJ) Right 05/02/2016    Procedure: ARTHRODESIS METATARSALPHALANGEAL JOINT (MTPJ) RIGHT GREAT TOE;  Surgeon: Newt Minion, MD;  Location: [facility];  Service: Orthopedics;  Laterality: Right;  . CERVICAL FUSION    . CESAREAN SECTION     x 2  . ORIF CLAVICULAR FRACTURE Right 05/05/2013   Procedure: OPEN REDUCTION INTERNAL FIXATION (ORIF) CLAVICULAR FRACTURE- right;  Surgeon: Meredith Pel, MD;  Location: [facility];  Service: Orthopedics;  Laterality: Right;  Right Clavicle Fracture Non Union Takedown and Fixation with Bone Graft, Coracoclavicular Ligament Reconstruction with Tight Rope.  Marland Kitchen SHOULDER SURGERY Right    x 3- fracture- screws,   . thumb operation    . TONSILLECTOMY AND ADENOIDECTOMY     as a child   Social History   Occupational History  . Not on file.   Social History Main Topics  . Smoking status: Current Every Day Smoker    Packs/day: 1.00    Years: 30.00  . Smokeless tobacco: Never Used  . Alcohol use No  . Drug use: No  . Sexual activity: Not on file

## 2016-07-13 NOTE — Addendum Note (Signed)
Addended by: Meridee Score on: 07/13/2016 10:34 AM   Modules accepted: Orders

## 2016-07-16 ENCOUNTER — Ambulatory Visit (INDEPENDENT_AMBULATORY_CARE_PROVIDER_SITE_OTHER): Payer: Medicaid Other | Admitting: Orthopedic Surgery

## 2016-07-30 ENCOUNTER — Other Ambulatory Visit (INDEPENDENT_AMBULATORY_CARE_PROVIDER_SITE_OTHER): Payer: Self-pay | Admitting: Family

## 2016-07-31 ENCOUNTER — Encounter (HOSPITAL_COMMUNITY): Payer: Self-pay | Admitting: *Deleted

## 2016-07-31 NOTE — Anesthesia Preprocedure Evaluation (Signed)
Anesthesia Evaluation  Patient identified by MRN, date of birth, ID band Patient awake    Reviewed: Allergy & Precautions, H&P , Patient's Chart, lab work & pertinent test results, reviewed documented beta blocker date and time   Airway Mallampati: II  TM Distance: >3 FB Neck ROM: full    Dental no notable dental hx.    Pulmonary Current Smoker,    Pulmonary exam normal breath sounds clear to auscultation       Cardiovascular  Rhythm:regular Rate:Normal     Neuro/Psych    GI/Hepatic   Endo/Other    Renal/GU      Musculoskeletal   Abdominal   Peds  Hematology   Anesthesia Other Findings   Reproductive/Obstetrics                             Anesthesia Physical Anesthesia Plan  ASA: II  Anesthesia Plan: General   Post-op Pain Management: GA combined w/ Regional for post-op pain   Induction:   Airway Management Planned: LMA  Additional Equipment:   Intra-op Plan:   Post-operative Plan: Extubation in OR  Informed Consent: I have reviewed the patients History and Physical, chart, labs and discussed the procedure including the risks, benefits and alternatives for the proposed anesthesia with the patient or authorized representative who has indicated his/her understanding and acceptance.   Dental advisory given  Plan Discussed with: CRNA and Surgeon  Anesthesia Plan Comments: (Discussed both nerve block for pain relief post-op and GA; including NV, sore throat, dental injury, and pulmonary complications)        Anesthesia Quick Evaluation

## 2016-08-01 ENCOUNTER — Encounter (HOSPITAL_COMMUNITY)
Admission: RE | Disposition: A | Discharge status: Home / Self Care | Payer: Self-pay | Source: Ambulatory Visit | Attending: Orthopedic Surgery

## 2016-08-01 ENCOUNTER — Ambulatory Visit (HOSPITAL_COMMUNITY): Payer: Medicaid Other | Admitting: Anesthesiology

## 2016-08-01 ENCOUNTER — Ambulatory Visit (INDEPENDENT_AMBULATORY_CARE_PROVIDER_SITE_OTHER): Payer: Medicaid Other | Admitting: Orthopaedic Surgery

## 2016-08-01 ENCOUNTER — Ambulatory Visit (HOSPITAL_COMMUNITY)
Admission: RE | Admit: 2016-08-01 | Discharge: 2016-08-01 | Disposition: A | Discharge status: Home / Self Care | Payer: Medicaid Other | Source: Ambulatory Visit | Attending: Orthopedic Surgery | Admitting: Orthopedic Surgery

## 2016-08-01 ENCOUNTER — Encounter (HOSPITAL_COMMUNITY): Payer: Self-pay | Admitting: *Deleted

## 2016-08-01 DIAGNOSIS — Z9889 Other specified postprocedural states: Secondary | ICD-10-CM | POA: Diagnosis not present

## 2016-08-01 DIAGNOSIS — Z888 Allergy status to other drugs, medicaments and biological substances status: Secondary | ICD-10-CM | POA: Insufficient documentation

## 2016-08-01 DIAGNOSIS — Z90710 Acquired absence of both cervix and uterus: Secondary | ICD-10-CM | POA: Insufficient documentation

## 2016-08-01 DIAGNOSIS — Z886 Allergy status to analgesic agent status: Secondary | ICD-10-CM | POA: Insufficient documentation

## 2016-08-01 DIAGNOSIS — F410 Panic disorder [episodic paroxysmal anxiety] without agoraphobia: Secondary | ICD-10-CM | POA: Insufficient documentation

## 2016-08-01 DIAGNOSIS — S92901K Unspecified fracture of right foot, subsequent encounter for fracture with nonunion: Secondary | ICD-10-CM

## 2016-08-01 DIAGNOSIS — K219 Gastro-esophageal reflux disease without esophagitis: Secondary | ICD-10-CM | POA: Insufficient documentation

## 2016-08-01 DIAGNOSIS — J45909 Unspecified asthma, uncomplicated: Secondary | ICD-10-CM | POA: Insufficient documentation

## 2016-08-01 DIAGNOSIS — H9192 Unspecified hearing loss, left ear: Secondary | ICD-10-CM | POA: Diagnosis not present

## 2016-08-01 DIAGNOSIS — Z969 Presence of functional implant, unspecified: Secondary | ICD-10-CM

## 2016-08-01 DIAGNOSIS — Z79899 Other long term (current) drug therapy: Secondary | ICD-10-CM | POA: Insufficient documentation

## 2016-08-01 DIAGNOSIS — M797 Fibromyalgia: Secondary | ICD-10-CM | POA: Insufficient documentation

## 2016-08-01 DIAGNOSIS — Z472 Encounter for removal of internal fixation device: Secondary | ICD-10-CM | POA: Insufficient documentation

## 2016-08-01 DIAGNOSIS — F17210 Nicotine dependence, cigarettes, uncomplicated: Secondary | ICD-10-CM | POA: Insufficient documentation

## 2016-08-01 HISTORY — PX: ARTHRODESIS METATARSAL: SHX6565

## 2016-08-01 HISTORY — DX: Unspecified osteoarthritis, unspecified site: M19.90

## 2016-08-01 LAB — CBC
HEMATOCRIT: 38 % (ref 36.0–46.0)
HEMOGLOBIN: 12.9 g/dL (ref 12.0–15.0)
MCH: 29.5 pg (ref 26.0–34.0)
MCHC: 33.9 g/dL (ref 30.0–36.0)
MCV: 86.8 fL (ref 78.0–100.0)
Platelets: 263 10*3/uL (ref 150–400)
RBC: 4.38 MIL/uL (ref 3.87–5.11)
RDW: 14.1 % (ref 11.5–15.5)
WBC: 5.8 10*3/uL (ref 4.0–10.5)

## 2016-08-01 LAB — SURGICAL PCR SCREEN
MRSA, PCR: NEGATIVE
STAPHYLOCOCCUS AUREUS: POSITIVE — AB

## 2016-08-01 SURGERY — FUSION, JOINT, INVOLVING METATARSAL BONE
Anesthesia: General | Site: Toe | Laterality: Right

## 2016-08-01 MED ORDER — FENTANYL CITRATE (PF) 100 MCG/2ML IJ SOLN
25.0000 ug | INTRAMUSCULAR | Status: DC | PRN
  Administered 2016-08-01 (×2): 25 ug via INTRAVENOUS
  Administered 2016-08-01: 50 ug via INTRAVENOUS

## 2016-08-01 MED ORDER — LIDOCAINE 2% (20 MG/ML) 5 ML SYRINGE
INTRAMUSCULAR | Status: AC
  Filled 2016-08-01: qty 5

## 2016-08-01 MED ORDER — BUPIVACAINE HCL (PF) 0.5 % IJ SOLN
INTRAMUSCULAR | Status: DC | PRN
  Administered 2016-08-01: 20 mL

## 2016-08-01 MED ORDER — OXYCODONE-ACETAMINOPHEN 5-325 MG PO TABS
ORAL_TABLET | ORAL | Status: AC
  Administered 2016-08-01: 2 via ORAL
  Filled 2016-08-01: qty 2

## 2016-08-01 MED ORDER — FENTANYL CITRATE (PF) 100 MCG/2ML IJ SOLN
INTRAMUSCULAR | Status: AC
  Filled 2016-08-01: qty 2

## 2016-08-01 MED ORDER — CHLORHEXIDINE GLUCONATE 4 % EX LIQD: 60.0000 mL | Freq: Once | CUTANEOUS | Status: DC

## 2016-08-01 MED ORDER — PROPOFOL 500 MG/50ML IV EMUL
INTRAVENOUS | Status: DC | PRN
Start: 2016-08-01 — End: 2016-08-01
  Administered 2016-08-01: 75 ug/kg/min via INTRAVENOUS

## 2016-08-01 MED ORDER — LACTATED RINGERS IV SOLN
INTRAVENOUS | Status: DC
  Administered 2016-08-01 (×2): via INTRAVENOUS

## 2016-08-01 MED ORDER — 0.9 % SODIUM CHLORIDE (POUR BTL) OPTIME
TOPICAL | Status: DC | PRN
  Administered 2016-08-01: 1000 mL

## 2016-08-01 MED ORDER — LIDOCAINE HCL (CARDIAC) 20 MG/ML IV SOLN
INTRAVENOUS | Status: DC | PRN
  Administered 2016-08-01: 100 mg via INTRATRACHEAL

## 2016-08-01 MED ORDER — MIDAZOLAM HCL 2 MG/2ML IJ SOLN
4.0000 mg | Freq: Once | INTRAMUSCULAR | Status: AC
Start: 2016-08-01 — End: 2016-08-01
  Administered 2016-08-01: 4 mg via INTRAVENOUS

## 2016-08-01 MED ORDER — PHENYLEPHRINE HCL 10 MG/ML IJ SOLN
INTRAMUSCULAR | Status: DC | PRN
  Administered 2016-08-01: 120 ug via INTRAVENOUS

## 2016-08-01 MED ORDER — FENTANYL CITRATE (PF) 100 MCG/2ML IJ SOLN
100.0000 ug | Freq: Once | INTRAMUSCULAR | Status: AC
  Administered 2016-08-01: 100 ug via INTRAVENOUS

## 2016-08-01 MED ORDER — ALBUTEROL SULFATE HFA 108 (90 BASE) MCG/ACT IN AERS
INHALATION_SPRAY | RESPIRATORY_TRACT | Status: DC | PRN
  Administered 2016-08-01: 2 via RESPIRATORY_TRACT

## 2016-08-01 MED ORDER — ONDANSETRON HCL 4 MG/2ML IJ SOLN
INTRAMUSCULAR | Status: DC | PRN
  Administered 2016-08-01: 4 mg via INTRAVENOUS

## 2016-08-01 MED ORDER — PROPOFOL 10 MG/ML IV BOLUS
INTRAVENOUS | Status: DC | PRN
  Administered 2016-08-01: 120 mg via INTRAVENOUS

## 2016-08-01 MED ORDER — FENTANYL CITRATE (PF) 100 MCG/2ML IJ SOLN
INTRAMUSCULAR | Status: AC
  Administered 2016-08-01: 25 ug via INTRAVENOUS
  Filled 2016-08-01: qty 2

## 2016-08-01 MED ORDER — ROCURONIUM BROMIDE 50 MG/5ML IV SOSY
PREFILLED_SYRINGE | INTRAVENOUS | Status: AC
  Filled 2016-08-01: qty 5

## 2016-08-01 MED ORDER — PROPOFOL 10 MG/ML IV BOLUS
INTRAVENOUS | Status: AC
  Filled 2016-08-01: qty 20

## 2016-08-01 MED ORDER — LIDOCAINE-EPINEPHRINE (PF) 1.5 %-1:200000 IJ SOLN
INTRAMUSCULAR | Status: DC | PRN
  Administered 2016-08-01: 25 mL

## 2016-08-01 MED ORDER — EPHEDRINE SULFATE 50 MG/ML IJ SOLN
INTRAMUSCULAR | Status: DC | PRN
  Administered 2016-08-01: 10 mg via INTRAVENOUS

## 2016-08-01 MED ORDER — MIDAZOLAM HCL 2 MG/2ML IJ SOLN
INTRAMUSCULAR | Status: AC
  Filled 2016-08-01: qty 2

## 2016-08-01 MED ORDER — ALBUTEROL SULFATE (2.5 MG/3ML) 0.083% IN NEBU
INHALATION_SOLUTION | RESPIRATORY_TRACT | Status: AC
  Filled 2016-08-01: qty 3

## 2016-08-01 MED ORDER — CEFAZOLIN SODIUM-DEXTROSE 2-4 GM/100ML-% IV SOLN
INTRAVENOUS | Status: AC
  Filled 2016-08-01: qty 100

## 2016-08-01 MED ORDER — OXYCODONE-ACETAMINOPHEN 5-325 MG PO TABS
1.0000 | ORAL_TABLET | Freq: Once | ORAL | Status: AC
  Administered 2016-08-01: 2 via ORAL

## 2016-08-01 MED ORDER — CEFAZOLIN SODIUM-DEXTROSE 2-4 GM/100ML-% IV SOLN
2.0000 g | INTRAVENOUS | Status: AC
  Administered 2016-08-01: 2 g via INTRAVENOUS

## 2016-08-01 MED ORDER — OXYCODONE-ACETAMINOPHEN 10-325 MG PO TABS
1.0000 | ORAL_TABLET | ORAL | 0 refills | Status: DC | PRN
Start: 2016-08-01 — End: 2016-11-26

## 2016-08-01 SURGICAL SUPPLY — 61 items
BIT DRILL TEMPLT ELIT COMPRESS (DRILL) IMPLANT
BLADE AVERAGE 25MMX9MM (BLADE)
BLADE AVERAGE 25X9 (BLADE) IMPLANT
BLADE MINI RND TIP GREEN BEAV (BLADE) IMPLANT
BNDG CMPR 9X4 STRL LF SNTH (GAUZE/BANDAGES/DRESSINGS) ×1
BNDG COHESIVE 1X5 TAN STRL LF (GAUZE/BANDAGES/DRESSINGS) IMPLANT
BNDG COHESIVE 4X5 TAN STRL (GAUZE/BANDAGES/DRESSINGS) ×2 IMPLANT
BNDG COHESIVE 6X5 TAN STRL LF (GAUZE/BANDAGES/DRESSINGS) ×2 IMPLANT
BNDG ESMARK 4X9 LF (GAUZE/BANDAGES/DRESSINGS) ×3 IMPLANT
BNDG GAUZE ELAST 4 BULKY (GAUZE/BANDAGES/DRESSINGS) ×2 IMPLANT
BNDG GAUZE STRTCH 6 (GAUZE/BANDAGES/DRESSINGS) IMPLANT
CORDS BIPOLAR (ELECTRODE) ×1 IMPLANT
COTTON STERILE ROLL (GAUZE/BANDAGES/DRESSINGS) IMPLANT
COVER SURGICAL LIGHT HANDLE (MISCELLANEOUS) ×6 IMPLANT
CUFF TOURNIQUET SINGLE 18IN (TOURNIQUET CUFF) IMPLANT
CUFF TOURNIQUET SINGLE 24IN (TOURNIQUET CUFF) IMPLANT
CUFF TOURNIQUET SINGLE 34IN LL (TOURNIQUET CUFF) IMPLANT
CUFF TOURNIQUET SINGLE 44IN (TOURNIQUET CUFF) IMPLANT
DRAPE OEC MINIVIEW 54X84 (DRAPES) ×2 IMPLANT
DRAPE U-SHAPE 47X51 STRL (DRAPES) ×3 IMPLANT
DRILL TEMPLATE ELITE COMPRESS (DRILL) ×2
DRSG ADAPTIC 3X8 NADH LF (GAUZE/BANDAGES/DRESSINGS) ×2 IMPLANT
DURAPREP 26ML APPLICATOR (WOUND CARE) ×3 IMPLANT
ELECT REM PT RETURN 9FT ADLT (ELECTROSURGICAL) ×3
ELECTRODE REM PT RTRN 9FT ADLT (ELECTROSURGICAL) ×1 IMPLANT
GAUZE SPONGE 2X2 8PLY STRL LF (GAUZE/BANDAGES/DRESSINGS) IMPLANT
GAUZE SPONGE 4X4 12PLY STRL (GAUZE/BANDAGES/DRESSINGS) ×2 IMPLANT
GLOVE BIOGEL PI IND STRL 9 (GLOVE) ×1 IMPLANT
GLOVE BIOGEL PI INDICATOR 9 (GLOVE) ×2
GLOVE SURG ORTHO 9.0 STRL STRW (GLOVE) ×3 IMPLANT
GOWN STRL REUS W/ TWL LRG LVL3 (GOWN DISPOSABLE) IMPLANT
GOWN STRL REUS W/ TWL XL LVL3 (GOWN DISPOSABLE) ×2 IMPLANT
GOWN STRL REUS W/TWL LRG LVL3 (GOWN DISPOSABLE) ×6
GOWN STRL REUS W/TWL XL LVL3 (GOWN DISPOSABLE) ×3
KIT BASIN OR (CUSTOM PROCEDURE TRAY) ×3 IMPLANT
KIT DRILL ELITE COMPRESS 3MM (DRILL) ×1 IMPLANT
KIT ELITE COMPRESSION 18X18X18 (Miscellaneous) ×1 IMPLANT
KIT ELITE COMPRESSION 20X20X20 (Miscellaneous) ×2 IMPLANT
KIT ROOM TURNOVER OR (KITS) ×3 IMPLANT
MANIFOLD NEPTUNE II (INSTRUMENTS) ×3 IMPLANT
NDL HYPO 25GX1X1/2 BEV (NEEDLE) IMPLANT
NEEDLE HYPO 25GX1X1/2 BEV (NEEDLE) IMPLANT
NS IRRIG 1000ML POUR BTL (IV SOLUTION) ×3 IMPLANT
PACK ORTHO EXTREMITY (CUSTOM PROCEDURE TRAY) ×3 IMPLANT
PAD ABD 8X10 STRL (GAUZE/BANDAGES/DRESSINGS) ×2 IMPLANT
PAD ARMBOARD 7.5X6 YLW CONV (MISCELLANEOUS) ×4 IMPLANT
PAD CAST 4YDX4 CTTN HI CHSV (CAST SUPPLIES) IMPLANT
PADDING CAST COTTON 4X4 STRL (CAST SUPPLIES)
SPECIMEN JAR SMALL (MISCELLANEOUS) ×1 IMPLANT
SPONGE GAUZE 2X2 STER 10/PKG (GAUZE/BANDAGES/DRESSINGS)
SUCTION FRAZIER HANDLE 10FR (MISCELLANEOUS) ×2
SUCTION TUBE FRAZIER 10FR DISP (MISCELLANEOUS) IMPLANT
SUT ETHILON 2 0 PSLX (SUTURE) ×4 IMPLANT
SUT VIC AB 2-0 CT1 27 (SUTURE) ×3
SUT VIC AB 2-0 CT1 TAPERPNT 27 (SUTURE) ×1 IMPLANT
SYR CONTROL 10ML LL (SYRINGE) IMPLANT
TOWEL OR 17X24 6PK STRL BLUE (TOWEL DISPOSABLE) ×3 IMPLANT
TOWEL OR 17X26 10 PK STRL BLUE (TOWEL DISPOSABLE) ×3 IMPLANT
TUBE CONNECTING 12'X1/4 (SUCTIONS)
TUBE CONNECTING 12X1/4 (SUCTIONS) IMPLANT
WATER STERILE IRR 1000ML POUR (IV SOLUTION) ×1 IMPLANT

## 2016-08-01 NOTE — H&P (Signed)
Susan Short is an 45 y.o. female.   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  Past Medical History:  Diagnosis Date  . Arthritis   . Asthma   . Constipation   . Deaf    left ear  . Difficult intubation    Patient reports that they had to use a small tube; MAC3, grade 1 view, 1 attempt, 7.0 ETT on 05/11/09  . Fibromyalgia   . GERD (gastroesophageal reflux disease)   . Hard of hearing    right  . Panic attack    Panic  . Seizures ([place])    "due to Tramadol"  . Shortness of breath dyspnea    with exertion    Past Surgical History:  Procedure Laterality Date  . ABDOMINAL HYSTERECTOMY    . APPENDECTOMY     as child  . ARTHRODESIS METATARSALPHALANGEAL JOINT (MTPJ) Right 05/02/2016   Procedure: ARTHRODESIS METATARSALPHALANGEAL JOINT (MTPJ) RIGHT GREAT TOE;  Surgeon: Newt Minion, MD;  Location: [facility];  Service: Orthopedics;  Laterality: Right;  . CERVICAL FUSION    . CESAREAN SECTION     x 2  . ORIF CLAVICULAR FRACTURE Right 05/05/2013   Procedure: OPEN REDUCTION INTERNAL FIXATION (ORIF) CLAVICULAR FRACTURE- right;  Surgeon: Meredith Pel, MD;  Location: [facility];  Service: Orthopedics;  Laterality: Right;  Right Clavicle Fracture Non Union Takedown and Fixation with Bone Graft, Coracoclavicular Ligament Reconstruction with Tight Rope.  Marland Kitchen SHOULDER SURGERY Right    x 3- fracture- screws,   . thumb operation    . TONSILLECTOMY AND ADENOIDECTOMY     as a child    No family history on file. Social History:  reports that she has been smoking.  She has a 45.00 pack-year smoking history. She has never used smokeless tobacco. She reports that she does not drink alcohol or use drugs.  Allergies:  Allergies  Allergen Reactions  . Ketorolac Tromethamine Hives  . Nsaids Hives  . Tramadol     seizures seizure   . Ibuprofen Hives and Rash    No prescriptions prior to admission.    No  results found for this or any previous visit (from the past 48 hour(s)). No results found.  Review of Systems  All other systems reviewed and are negative.   There were no vitals taken for this visit. Physical Exam  Examination patient is alert oriented no adenopathy well-dressed normal affect normal respiratory effort she does have an antalgic gait she has good pulses. Examination she has pain to palpation around the fusion of the great toe MTP joint. Radiographs shows a nonunion. Patient is still smoking. Assessment/Plan Assessment: Nonunion fusion right great toe MTP joint.  Plan: We will plan for revision internal fixation with removal deep hardware placement of staples. Risks and benefits were discussed including risk of the bone not healing with patient's persistent smoking. Risk of the wound not healing. Patient states she understands states she will stop smoking at this time follow-up in the office in 2 weeks.  Newt Minion, MD 08/01/2016, 6:39 AM

## 2016-08-01 NOTE — Progress Notes (Signed)
BP readings called to Dr Seward Speck. Pt states she feels fine.

## 2016-08-01 NOTE — Anesthesia Procedure Notes (Signed)
Anesthesia Regional Block:  Ankle block  Pre-Anesthetic Checklist: ,, timeout performed, Correct Patient, Correct Site, Correct Laterality, Correct Procedure, Correct Position, site marked, Risks and benefits discussed, pre-op evaluation,  At surgeon's request and post-op pain management  Laterality: Right  Prep: chloraprep       Needles:      Needle Length: 4cm 4 cm Needle Gauge: 21 and 21 G    Additional Needles: Ankle block Narrative:  Start time: 08/01/2016 9:10 AM End time: 08/01/2016 9:17 AM Injection made incrementally with aspirations every 5 mL.  Performed by: Personally  Anesthesiologist: Lyndle Herrlich  Additional Notes: 25cc 1.5% Xylo + 20cc .5% Marcaine

## 2016-08-01 NOTE — Transfer of Care (Signed)
Immediate Anesthesia Transfer of Care Note  Patient: Susan Short  Procedure(s) Performed: Procedure(s): Take Down Non Union, Architect, Revision Fusion Right Great Toe (Right)  Patient Location: PACU  Anesthesia Type:General  Level of Consciousness: awake and patient cooperative  Airway & Oxygen Therapy: Patient Spontanous Breathing and Patient connected to nasal cannula oxygen  Post-op Assessment: Report given to RN and Post -op Vital signs reviewed and stable  Post vital signs: Reviewed and stable  Last Vitals:  Vitals:   08/01/16 0910 08/01/16 0915  BP: (!) 98/50 117/73  Pulse: 67   Resp: (!) 9 11  Temp:      Last Pain:  Vitals:   08/01/16 0751  TempSrc:   PainSc: 8       Patients Stated Pain Goal: 2 ([id number])  Complications: No apparent anesthesia complications

## 2016-08-01 NOTE — Anesthesia Procedure Notes (Signed)
Procedure Name: LMA Insertion Date/Time: 08/01/2016 9:49 AM Performed by: Lance Coon Pre-anesthesia Checklist: Emergency Drugs available, Suction available, Patient being monitored, Timeout performed and Patient identified Patient Re-evaluated:Patient Re-evaluated prior to inductionOxygen Delivery Method: Circle system utilized Preoxygenation: Pre-oxygenation with 100% oxygen Intubation Type: IV induction LMA: LMA inserted LMA Size: 3.0 Number of attempts: 1 Placement Confirmation: positive ETCO2 and breath sounds checked- equal and bilateral Tube secured with: Tape Dental Injury: Teeth and Oropharynx as per pre-operative assessment

## 2016-08-01 NOTE — Progress Notes (Signed)
Open area noted to outer right foot and scab like area noted to top of right foot. No  Drainage noted. About the size of a dime with red center.

## 2016-08-01 NOTE — Op Note (Signed)
08/01/2016  10:20 AM  PATIENT:  Susan Short    PRE-OPERATIVE DIAGNOSIS:  Non-Union Fusion Right Great Toe MTP  POST-OPERATIVE DIAGNOSIS:  Same  PROCEDURE:  Take Down Non Union, Remove Hardware, Revision Fusion Right Great Toe  SURGEON:  Newt Minion, MD  PHYSICIAN ASSISTANT:None ANESTHESIA:   General  PREOPERATIVE INDICATIONS:  [name] is a  45 y.o. female with a diagnosis of Non-Union Fusion Right Great Toe MTP who failed conservative measures and elected for surgical management.    The risks benefits and alternatives were discussed with the patient preoperatively including but not limited to the risks of infection, bleeding, nerve injury, cardiopulmonary complications, the need for revision surgery, among others, and the patient was willing to proceed.  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 After adequate levels anesthesia obtained patient's right lower extremity was prepped using DuraPrep draped into a sterile field a timeout was called. Patient had insufficient anesthetic and was then anesthetized with a LMA anesthetic. A medial longitudinal incision was made through her previous incision. This was carried sharply down to the plate. There is some mild cystic changes these were removed. The plate and screws were removed without complications. Patient had a fibrous union which was unstable. Using the cup and cone reamers 16 mm a cup reamer was used on the first metatarsal head and a cone reamer the base of the proximal phalanx of the great toe. This was debrided back to bleeding viable subchondral bone. The joint was reduced and 2 staples were placed perpendicular to each other a 18 mm staple was placed medial to lateral and a 20 mm staple was placed dorsal to plantar. The construct was stable. The  wound was irrigated with normal saline and the incision was closed using 2-0 nylon a sterile compressive dressing was applied. Patient was extubated taken to the PACU in stable condition plan for discharge to home.

## 2016-08-01 NOTE — Anesthesia Postprocedure Evaluation (Signed)
Anesthesia Post Note  Patient: Susan Short  Procedure(s) Performed: Procedure(s) (LRB): Take Down Non Union, Remove Hardware, Revision Fusion Right Great Toe (Right)  Patient location during evaluation: PACU Anesthesia Type: General Level of consciousness: sedated Pain management: satisfactory to patient Vital Signs Assessment: post-procedure vital signs reviewed and stable Respiratory status: spontaneous breathing Cardiovascular status: stable Anesthetic complications: no       Last Vitals:  Vitals:   08/01/16 1130 08/01/16 1140  BP:  (!) 91/54  Pulse: 68 68  Resp: 11 14  Temp:      Last Pain:  Vitals:   08/01/16 1045  TempSrc:   PainSc: [place]

## 2016-08-01 NOTE — Anesthesia Procedure Notes (Signed)
Procedure Name: MAC Date/Time: 08/01/2016 9:38 AM Performed by: Lance Coon Pre-anesthesia Checklist: Patient identified, Emergency Drugs available, Patient being monitored, Timeout performed and Suction available Oxygen Delivery Method: Nasal cannula

## 2016-08-01 NOTE — Progress Notes (Signed)
Anesthesia at bedside. Comfortable with VS & pain control.  Interpreter at bedside. [facility] contacted for rolling walker.

## 2016-08-08 ENCOUNTER — Encounter (HOSPITAL_COMMUNITY): Payer: Self-pay | Admitting: Orthopedic Surgery

## 2016-08-09 ENCOUNTER — Ambulatory Visit (INDEPENDENT_AMBULATORY_CARE_PROVIDER_SITE_OTHER): Payer: Medicaid Other | Admitting: Orthopedic Surgery

## 2016-08-16 ENCOUNTER — Ambulatory Visit (INDEPENDENT_AMBULATORY_CARE_PROVIDER_SITE_OTHER): Payer: Medicaid Other | Admitting: Orthopedic Surgery

## 2016-10-11 ENCOUNTER — Ambulatory Visit (INDEPENDENT_AMBULATORY_CARE_PROVIDER_SITE_OTHER): Payer: Medicaid Other | Admitting: Orthopedic Surgery

## 2016-11-26 ENCOUNTER — Ambulatory Visit (INDEPENDENT_AMBULATORY_CARE_PROVIDER_SITE_OTHER): Payer: Medicaid Other

## 2016-11-26 ENCOUNTER — Ambulatory Visit (INDEPENDENT_AMBULATORY_CARE_PROVIDER_SITE_OTHER): Payer: Medicaid Other | Admitting: Family

## 2016-11-26 ENCOUNTER — Encounter (INDEPENDENT_AMBULATORY_CARE_PROVIDER_SITE_OTHER): Payer: Self-pay | Admitting: Family

## 2016-11-26 VITALS — Ht 63.0 in | Wt 165.0 lb

## 2016-11-26 DIAGNOSIS — Z969 Presence of functional implant, unspecified: Secondary | ICD-10-CM

## 2016-11-26 DIAGNOSIS — M79674 Pain in right toe(s): Secondary | ICD-10-CM | POA: Diagnosis not present

## 2016-11-26 DIAGNOSIS — S92901K Unspecified fracture of right foot, subsequent encounter for fracture with nonunion: Secondary | ICD-10-CM | POA: Diagnosis not present

## 2016-11-26 MED ORDER — PREGABALIN 100 MG PO CAPS: 100.0000 mg | ORAL_CAPSULE | Freq: Two times a day (BID) | ORAL | 2 refills | Status: DC

## 2016-11-26 MED ORDER — OXYCODONE-ACETAMINOPHEN 10-325 MG PO TABS: 1.0000 | ORAL_TABLET | Freq: Four times a day (QID) | ORAL | 0 refills | Status: DC | PRN

## 2016-11-26 NOTE — Progress Notes (Signed)
Office Visit Note   Patient: Susan Short           Date of Birth: [birth year]           MRN: [id number] Visit Date: 11/26/2016              Requested by: Antonietta Jewel, MD [address] [place], [place] [address] PCP: Antonietta Jewel, MD  Chief Complaint  Patient presents with  . Right Foot - Pain    07/13/16 pt had a take down non union right great toe fusion.      HPI: The patient is a 46 year old woman who presents today in follow-up for right great toe pain. She is status post takedown of nonunion with right great toe fusion in December of last year. She states that this was feeling a lot better when she began weightbearing on it. States she removed her sutures. Today presents for her first follow-up. Has been following with her internal medicine physician. He had been filling her pain medicine. Today requests refills on her Percocet and Lyrica. States that he referred her back to [place] for continued pain. Has been walking on the lateral aspect of her foot to offload the great toe due to pain.  Assessment & Plan: Visit Diagnoses:  1. Great toe pain, right   2. Retained orthopedic hardware   3. Nonunion of foot fracture, right     Plan: We'll discuss surgical options with [name] and give her call with the plan of care later this week. She'll follow-up in office postoperatively.  Follow-Up Instructions: Return for 1 w post op.   Ortho Exam  Patient is alert, oriented, no adenopathy, well-dressed, normal affect, normal respiratory effort. Right great toe MTP joint is tender with deep palpation. The incision is well-healed. There is moderate swelling to right GT and MTP. Does have a palpable DP pulse. There is no erythema. Buildup of callus beneath the fifth metatarsal head.  Imaging: Xr Toe Great Right  Result Date: 11/26/2016 Radiographs of the right great toe show nonunion of MTP fusion.   Labs: No results found for: HGBA1C, ESRSEDRATE, CRP, LABURIC, REPTSTATUS,  GRAMSTAIN, CULT, LABORGA  Orders:  Orders Placed This Encounter  Procedures  . XR Toe Great Right   Meds ordered this encounter  Medications  . oxyCODONE-acetaminophen (PERCOCET) 10-325 MG tablet    Sig: Take 1 tablet by mouth every 6 (six) hours as needed for pain.    Dispense:  28 tablet    Refill:  0  . pregabalin (LYRICA) 100 MG capsule    Sig: Take 1 capsule (100 mg total) by mouth 2 (two) times daily.    Dispense:  60 capsule    Refill:  2     Procedures: No procedures performed  Clinical Data: No additional findings.  ROS:  Review of Systems  Constitutional: Negative for chills and fever.  Musculoskeletal: Positive for arthralgias and gait problem.  Skin: Negative for color change and wound.    Objective: Vital Signs: Ht 5\' 3"  (1.6 m)   Wt 165 lb (74.8 kg)   BMI 29.23 kg/m   Specialty Comments:  No specialty comments available.  PMFS History: Patient Active Problem List   Diagnosis Date Noted  . Retained orthopedic hardware   . Nonunion of foot fracture, right 07/13/2016  . DECREASED HEARING 10/13/2010  . ALLERGIC RHINITIS 10/13/2010  . PULMONARY NODULE 10/13/2010  . NEOPLASM, BENIGN, THYROID 09/22/2010  . HYPERLIPIDEMIA 09/22/2010   Past Medical History:  Diagnosis  Date  . Arthritis   . Asthma   . Constipation   . Deaf    left ear  . Difficult intubation    Patient reports that they had to use a small tube; MAC3, grade 1 view, 1 attempt, 7.0 ETT on 05/11/09  . Fibromyalgia   . GERD (gastroesophageal reflux disease)   . Hard of hearing    right  . Panic attack    Panic  . Seizures ([place])    "due to Tramadol"  . Shortness of breath dyspnea    with exertion    No family history on file.  Past Surgical History:  Procedure Laterality Date  . ABDOMINAL HYSTERECTOMY    . APPENDECTOMY     as child  . ARTHRODESIS METATARSAL Right 08/01/2016   Procedure: Take Down Non Union, Remove Hardware, Revision Fusion Right Great Toe;  Surgeon: Newt Minion, MD;  Location: [facility];  Service: Orthopedics;  Laterality: Right;  . ARTHRODESIS METATARSALPHALANGEAL JOINT (MTPJ) Right 05/02/2016   Procedure: ARTHRODESIS METATARSALPHALANGEAL JOINT (MTPJ) RIGHT GREAT TOE;  Surgeon: Newt Minion, MD;  Location: [facility];  Service: Orthopedics;  Laterality: Right;  . CERVICAL FUSION    . CESAREAN SECTION     x 2  . ORIF CLAVICULAR FRACTURE Right 05/05/2013   Procedure: OPEN REDUCTION INTERNAL FIXATION (ORIF) CLAVICULAR FRACTURE- right;  Surgeon: Meredith Pel, MD;  Location: [facility];  Service: Orthopedics;  Laterality: Right;  Right Clavicle Fracture Non Union Takedown and Fixation with Bone Graft, Coracoclavicular Ligament Reconstruction with Tight Rope.  Marland Kitchen SHOULDER SURGERY Right    x 3- fracture- screws,   . thumb operation    . TONSILLECTOMY AND ADENOIDECTOMY     as a child   Social History   Occupational History  . Not on file.   Social History Main Topics  . Smoking status: Current Every Day Smoker    Packs/day: 1.50    Years: 30.00  . Smokeless tobacco: Never Used  . Alcohol use No  . Drug use: No  . Sexual activity: Not on file

## 2016-11-29 ENCOUNTER — Telehealth (INDEPENDENT_AMBULATORY_CARE_PROVIDER_SITE_OTHER): Payer: Self-pay | Admitting: Orthopedic Surgery

## 2016-11-29 NOTE — Telephone Encounter (Signed)
Called patient left message to return call to schedule appointment with Dr Sharol Given. ( ROV Rt foot great toe) [phone]

## 2016-12-03 ENCOUNTER — Other Ambulatory Visit (INDEPENDENT_AMBULATORY_CARE_PROVIDER_SITE_OTHER): Payer: Self-pay | Admitting: Orthopedic Surgery

## 2016-12-03 ENCOUNTER — Telehealth (INDEPENDENT_AMBULATORY_CARE_PROVIDER_SITE_OTHER): Payer: Self-pay | Admitting: Orthopedic Surgery

## 2016-12-03 MED ORDER — OXYCODONE-ACETAMINOPHEN 10-325 MG PO TABS: 1.0000 | ORAL_TABLET | Freq: Four times a day (QID) | ORAL | 0 refills | Status: DC | PRN

## 2016-12-03 NOTE — Telephone Encounter (Signed)
Pt requests refill of her pain medication.  [phone]

## 2016-12-03 NOTE — Telephone Encounter (Signed)
rx written

## 2016-12-04 ENCOUNTER — Ambulatory Visit (INDEPENDENT_AMBULATORY_CARE_PROVIDER_SITE_OTHER): Payer: Medicaid Other | Admitting: Orthopedic Surgery

## 2016-12-04 NOTE — Telephone Encounter (Signed)
Called pt to advise that rx is ready for pick up. Lm on vm to call with questions

## 2016-12-05 ENCOUNTER — Ambulatory Visit (INDEPENDENT_AMBULATORY_CARE_PROVIDER_SITE_OTHER): Payer: Medicaid Other | Admitting: Family

## 2016-12-05 ENCOUNTER — Other Ambulatory Visit: Payer: Self-pay | Admitting: *Deleted

## 2016-12-10 ENCOUNTER — Telehealth (INDEPENDENT_AMBULATORY_CARE_PROVIDER_SITE_OTHER): Payer: Self-pay | Admitting: Orthopedic Surgery

## 2016-12-10 NOTE — Telephone Encounter (Signed)
Pt has an appt with [name] Wednesday will hold for review while in office.

## 2016-12-10 NOTE — Telephone Encounter (Signed)
PT REQUEST REFILL OF PAIN MEDS PLEASE  [phone]

## 2016-12-12 ENCOUNTER — Ambulatory Visit (INDEPENDENT_AMBULATORY_CARE_PROVIDER_SITE_OTHER): Payer: Medicaid Other | Admitting: Orthopedic Surgery

## 2016-12-12 DIAGNOSIS — M960 Pseudarthrosis after fusion or arthrodesis: Secondary | ICD-10-CM

## 2016-12-12 MED ORDER — OXYCODONE-ACETAMINOPHEN 10-325 MG PO TABS: 1.0000 | ORAL_TABLET | Freq: Three times a day (TID) | ORAL | 0 refills | Status: DC | PRN

## 2016-12-12 MED ORDER — PREGABALIN 100 MG PO CAPS: 100.0000 mg | ORAL_CAPSULE | Freq: Two times a day (BID) | ORAL | 2 refills | Status: DC

## 2016-12-12 NOTE — Progress Notes (Signed)
Office Visit Note   Patient: Susan Short           Date of Birth: February 02, 1971           MRN: [id number] Visit Date: 12/12/2016              Requested by: Antonietta Jewel, MD [address] [place], [place] [address] PCP: Antonietta Jewel, MD  No chief complaint on file.     HPI: Patient presents in follow-up status post revision fusion for the right great toe MTP joint. Patient did not follow-up after her December surgery she removed the sutures herself Follow-up was last month radiographs were obtained which showed a fibrous union.  Assessment & Plan: Visit Diagnoses:  1. Pseudarthrosis after fusion or arthrodesis     Plan: Discussed that we will proceed with a short leg cast strict nonweightbearing right lower extremity recommended that she get either a wheelchair or a kneeling scooter she is not to be walking on the cast. I feel with cast immobilization for up to 6 weeks should allow this to heal. Discussed with the patient repeat surgery could have the potential complication of amputation of the great toe. Patient states she's not opposed amputating her toe.  Follow-Up Instructions: Return in about 3 weeks (around 01/02/2017).   Ortho Exam  Patient is alert, oriented, no adenopathy, well-dressed, normal affect, normal respiratory effort. Examination there is no redness no cellulitis no signs of infection there is swelling patient has pain to palpation around the great toe MTP joint. Her radiographs show a fibrous union  Imaging: No results found.  Labs: No results found for: HGBA1C, ESRSEDRATE, CRP, LABURIC, REPTSTATUS, GRAMSTAIN, CULT, LABORGA  Orders:  No orders of the defined types were placed in this encounter.  Meds ordered this encounter  Medications  . pregabalin (LYRICA) 100 MG capsule    Sig: Take 1 capsule (100 mg total) by mouth 2 (two) times daily.    Dispense:  60 capsule    Refill:  2  . oxyCODONE-acetaminophen (PERCOCET) 10-325 MG tablet    Sig:  Take 1 tablet by mouth every 8 (eight) hours as needed for pain.    Dispense:  20 tablet    Refill:  0     Procedures: No procedures performed  Clinical Data: No additional findings.  ROS:  All other systems negative, except as noted in the HPI. Review of Systems  Objective: Vital Signs: There were no vitals taken for this visit.  Specialty Comments:  No specialty comments available.  PMFS History: Patient Active Problem List   Diagnosis Date Noted  . 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 Retained orthopedic hardware   . Nonunion of foot fracture, right 07/13/2016  . DECREASED HEARING 10/13/2010  . ALLERGIC RHINITIS 10/13/2010  . PULMONARY NODULE 10/13/2010  . NEOPLASM, BENIGN, THYROID 09/22/2010  . HYPERLIPIDEMIA 09/22/2010   Past Medical History:  Diagnosis Date  . Arthritis   . Asthma   . Constipation   . Deaf    left ear  . Difficult intubation    Patient reports that they had to use a small tube; MAC3, grade 1 view, 1 attempt, 7.0 ETT on 05/11/09  . Fibromyalgia   . GERD (gastroesophageal reflux disease)   . Hard of hearing    right  . Panic attack    Panic  . Seizures ([place])    "due to Tramadol"  . Shortness of breath dyspnea    with exertion    No  family history on file.  Past Surgical History:  Procedure Laterality Date  . ABDOMINAL HYSTERECTOMY    . APPENDECTOMY     as child  . ARTHRODESIS METATARSAL Right 08/01/2016   Procedure: Take Down Non Union, Remove Hardware, Revision Fusion Right Great Toe;  Surgeon: Newt Minion, MD;  Location: [facility];  Service: Orthopedics;  Laterality: Right;  . ARTHRODESIS METATARSALPHALANGEAL JOINT (MTPJ) Right 05/02/2016   Procedure: ARTHRODESIS METATARSALPHALANGEAL JOINT (MTPJ) RIGHT GREAT TOE;  Surgeon: Newt Minion, MD;  Location: [facility];  Service: Orthopedics;  Laterality: Right;  . CERVICAL FUSION    . CESAREAN SECTION     x 2  . ORIF CLAVICULAR FRACTURE Right 05/05/2013   Procedure: OPEN  REDUCTION INTERNAL FIXATION (ORIF) CLAVICULAR FRACTURE- right;  Surgeon: Meredith Pel, MD;  Location: [facility];  Service: Orthopedics;  Laterality: Right;  Right Clavicle Fracture Non Union Takedown and Fixation with Bone Graft, Coracoclavicular Ligament Reconstruction with Tight Rope.  Marland Kitchen SHOULDER SURGERY Right    x 3- fracture- screws,   . thumb operation    . TONSILLECTOMY AND ADENOIDECTOMY     as a child   Social History   Occupational History  . Not on file.   Social History Main Topics  . Smoking status: Current Every Day Smoker    Packs/day: 1.50    Years: 30.00  . Smokeless tobacco: Never Used  . Alcohol use No  . Drug use: No  . Sexual activity: Not on file

## 2016-12-18 ENCOUNTER — Telehealth (INDEPENDENT_AMBULATORY_CARE_PROVIDER_SITE_OTHER): Payer: Self-pay | Admitting: Orthopedic Surgery

## 2016-12-18 NOTE — Telephone Encounter (Signed)
Patient's mother Nickola Major) called advised patient need Rx refilled (Percocet) The number to contact [name] is [phone]

## 2016-12-18 NOTE — Telephone Encounter (Signed)
Prescription written 6 days ago for 10 mg Percocet. Cannot write  prescriptios at this time. If patient is having pain she needs to elevate her foot and be strict nonweightbearing.

## 2016-12-19 NOTE — Telephone Encounter (Signed)
I called and advised of the message below, to call with additional questions.

## 2016-12-28 ENCOUNTER — Telehealth (INDEPENDENT_AMBULATORY_CARE_PROVIDER_SITE_OTHER): Payer: Self-pay | Admitting: *Deleted

## 2016-12-28 NOTE — Telephone Encounter (Signed)
Patient's mother called in about her daughter needing a refill on her pain medication for Percocet please if possible. Her CB # [phone]. Thank you

## 2016-12-31 ENCOUNTER — Ambulatory Visit (INDEPENDENT_AMBULATORY_CARE_PROVIDER_SITE_OTHER): Payer: Medicaid Other | Admitting: Orthopedic Surgery

## 2016-12-31 ENCOUNTER — Ambulatory Visit (INDEPENDENT_AMBULATORY_CARE_PROVIDER_SITE_OTHER): Payer: Medicaid Other

## 2016-12-31 ENCOUNTER — Encounter (INDEPENDENT_AMBULATORY_CARE_PROVIDER_SITE_OTHER): Payer: Self-pay | Admitting: Orthopedic Surgery

## 2016-12-31 VITALS — Ht 63.0 in | Wt 165.0 lb

## 2016-12-31 DIAGNOSIS — M79671 Pain in right foot: Secondary | ICD-10-CM

## 2016-12-31 DIAGNOSIS — M960 Pseudarthrosis after fusion or arthrodesis: Secondary | ICD-10-CM

## 2016-12-31 NOTE — Progress Notes (Signed)
Office Visit Note   Patient: Susan Short           Date of Birth: 14-Aug-1970           MRN: [id number] Visit Date: 12/31/2016              Requested by: Antonietta Jewel, MD [address] [place], [place] [address] PCP: Antonietta Jewel, MD  Chief Complaint  Patient presents with  . Right Foot - Pain    Right great toe take down nonunion and fusion 07/13/16      HPI:  patient presents in follow-up status post revision fusion right great toe MTP joint. Patient was placed in a cast to immobilize the fusion and patient cut the cast off at home. Patient has been full weightbearing in soft sandals. Patient is still smoking as per her mother.  Assessment & Plan: Visit Diagnoses:  1. Pain in right foot   2. Pseudarthrosis after fusion or arthrodesis     Plan:  Discussed the importance of nonweightbearing. Discussed the importance of compliance.  Prescription written for a wheelchair will also get in her fracture boot she is to use her walker and be nonweightbearing with the fracture boot as well as use a wheelchair for ambulation. Discussed the importance of not relying on narcotics. Discussed that with current noncompliance we would need to proceed with amputation of the great toe and with patient's smoking and noncompliance this surgical incision could also breakdown not heal and patient would have the potential for higher level amputation. Again discussed the importance of compliance for proper healing.  Follow-Up Instructions: Return in about 3 weeks (around 01/21/2017).   Ortho Exam  Patient is alert, oriented, no adenopathy, well-dressed, normal affect, normal respiratory effort.  on examination there is no redness no cellulitis no signs of infection there is no signs of gout. Patient has exquisite pain with light touch she has no dystrophic changes no skin color or temperature changes. Patient has excoriations on her upper extremities and both lower extremities.  Imaging: Xr  Foot 2 Views Right  Result Date: 12/31/2016  Two-view radiographs of the right foot shows fibrous union of the revision fusion great toe MTP joint.   Labs: No results found for: HGBA1C, ESRSEDRATE, CRP, LABURIC, REPTSTATUS, GRAMSTAIN, CULT, LABORGA  Orders:  Orders Placed This Encounter  Procedures  . XR Foot 2 Views Right   No orders of the defined types were placed in this encounter.    Procedures: No procedures performed  Clinical Data: No additional findings.  ROS:  All other systems negative, except as noted in the HPI. Review of Systems  Objective: Vital Signs: Ht 5\' 3"  (1.6 m)   Wt 165 lb (74.8 kg)   BMI 29.23 kg/m   Specialty Comments:  No specialty comments available.  PMFS History: Patient Active Problem List   Diagnosis Date Noted  . 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 Retained orthopedic hardware   . Nonunion of foot fracture, right 07/13/2016  . DECREASED HEARING 10/13/2010  . ALLERGIC RHINITIS 10/13/2010  . PULMONARY NODULE 10/13/2010  . NEOPLASM, BENIGN, THYROID 09/22/2010  . HYPERLIPIDEMIA 09/22/2010   Past Medical History:  Diagnosis Date  . Arthritis   . Asthma   . Constipation   . Deaf    left ear  . Difficult intubation    Patient reports that they had to use a small tube; MAC3, grade 1 view, 1 attempt, 7.0 ETT on 05/11/09  . Fibromyalgia   .  GERD (gastroesophageal reflux disease)   . Hard of hearing    right  . Panic attack    Panic  . Seizures ([place])    "due to Tramadol"  . Shortness of breath dyspnea    with exertion    No family history on file.  Past Surgical History:  Procedure Laterality Date  . ABDOMINAL HYSTERECTOMY    . APPENDECTOMY     as child  . ARTHRODESIS METATARSAL Right 08/01/2016   Procedure: Take Down Non Union, Remove Hardware, Revision Fusion Right Great Toe;  Surgeon: Newt Minion, MD;  Location: [facility];  Service: Orthopedics;  Laterality: Right;  . ARTHRODESIS METATARSALPHALANGEAL  JOINT (MTPJ) Right 05/02/2016   Procedure: ARTHRODESIS METATARSALPHALANGEAL JOINT (MTPJ) RIGHT GREAT TOE;  Surgeon: Newt Minion, MD;  Location: [facility];  Service: Orthopedics;  Laterality: Right;  . CERVICAL FUSION    . CESAREAN SECTION     x 2  . ORIF CLAVICULAR FRACTURE Right 05/05/2013   Procedure: OPEN REDUCTION INTERNAL FIXATION (ORIF) CLAVICULAR FRACTURE- right;  Surgeon: Meredith Pel, MD;  Location: [facility];  Service: Orthopedics;  Laterality: Right;  Right Clavicle Fracture Non Union Takedown and Fixation with Bone Graft, Coracoclavicular Ligament Reconstruction with Tight Rope.  Marland Kitchen SHOULDER SURGERY Right    x 3- fracture- screws,   . thumb operation    . TONSILLECTOMY AND ADENOIDECTOMY     as a child   Social History   Occupational History  . Not on file.   Social History Main Topics  . Smoking status: Current Every Day Smoker    Packs/day: 1.50    Years: 30.00  . Smokeless tobacco: Never Used  . Alcohol use No  . Drug use: No  . Sexual activity: Not on file

## 2016-12-31 NOTE — Telephone Encounter (Signed)
I called and sw pt's mother and she states the pt says her cast "slipped off" and that she is in pain and needs medication. I advised that we are happy to see her in the office today and that she can discuss treatment with [name].

## 2017-01-04 ENCOUNTER — Telehealth (INDEPENDENT_AMBULATORY_CARE_PROVIDER_SITE_OTHER): Payer: Self-pay | Admitting: Orthopedic Surgery

## 2017-01-04 NOTE — Telephone Encounter (Signed)
Patient's mother [name]) called advised patient need Rx refilled (Percocet) The number to contact patient is [phone]

## 2017-01-08 ENCOUNTER — Other Ambulatory Visit (INDEPENDENT_AMBULATORY_CARE_PROVIDER_SITE_OTHER): Payer: Self-pay | Admitting: Orthopedic Surgery

## 2017-01-08 MED ORDER — OXYCODONE-ACETAMINOPHEN 10-325 MG PO TABS: 1.0000 | ORAL_TABLET | Freq: Three times a day (TID) | ORAL | 0 refills | Status: DC | PRN

## 2017-01-08 NOTE — Telephone Encounter (Signed)
Prescription written

## 2017-01-09 NOTE — Telephone Encounter (Signed)
I called to advise rx will be at the front desk for pick up.

## 2017-01-21 ENCOUNTER — Ambulatory Visit (INDEPENDENT_AMBULATORY_CARE_PROVIDER_SITE_OTHER): Payer: Medicaid Other | Admitting: Orthopedic Surgery

## 2017-01-31 ENCOUNTER — Ambulatory Visit (INDEPENDENT_AMBULATORY_CARE_PROVIDER_SITE_OTHER): Payer: Medicaid Other | Admitting: Orthopedic Surgery

## 2017-03-11 ENCOUNTER — Ambulatory Visit (INDEPENDENT_AMBULATORY_CARE_PROVIDER_SITE_OTHER): Payer: Medicaid Other | Admitting: Orthopedic Surgery

## 2017-04-19 ENCOUNTER — Ambulatory Visit (INDEPENDENT_AMBULATORY_CARE_PROVIDER_SITE_OTHER): Payer: Medicaid Other | Admitting: Family

## 2017-04-19 ENCOUNTER — Encounter (INDEPENDENT_AMBULATORY_CARE_PROVIDER_SITE_OTHER): Payer: Self-pay | Admitting: Family

## 2017-04-19 DIAGNOSIS — S92901K Unspecified fracture of right foot, subsequent encounter for fracture with nonunion: Secondary | ICD-10-CM | POA: Diagnosis not present

## 2017-04-19 DIAGNOSIS — M960 Pseudarthrosis after fusion or arthrodesis: Secondary | ICD-10-CM

## 2017-04-19 MED ORDER — PREGABALIN 100 MG PO CAPS: 100.0000 mg | ORAL_CAPSULE | Freq: Two times a day (BID) | ORAL | 2 refills | Status: DC

## 2017-04-19 MED ORDER — TRAMADOL HCL 50 MG PO TABS: 50.0000 mg | ORAL_TABLET | Freq: Four times a day (QID) | ORAL | 0 refills | Status: DC | PRN

## 2017-04-19 NOTE — Progress Notes (Signed)
Office Visit Note   Patient: Susan Short           Date of Birth: 1971/06/29           MRN: [id number] Visit Date: 04/19/2017              Requested by: Antonietta Jewel, MD [address] [place], [place] [address] PCP: Antonietta Jewel, MD  Chief Complaint  Patient presents with  . Right Foot - Pain      HPI: The patient is a 46 year old woman who presents in follow-up status post revision fusion right great toe MTP joint for ongoing complaint of great toe pain. Sensitivity. Shooting pain. Request prescription for percocet. Has been in a CAM walker off and on.   Assessment & Plan: Visit Diagnoses:  1. Pseudarthrosis after fusion or arthrodesis   2. Nonunion of foot fracture, right     Plan:  Discussed the importance of nonweightbearing. Discussed the importance of compliance.  Discussed the importance of not relying on narcotics. Discussed that with current noncompliance we would need to proceed with amputation of the great toe and with patient's smoking and noncompliance this surgical incision could also breakdown not heal and patient would have the potential for higher level amputation. Again discussed the importance of compliance for proper healing. Patient would like to proceed with great toe amputation on the right. Cheryl to set this up.   Follow-Up Instructions: Return for post op.   Ortho Exam  Patient is alert, oriented, no adenopathy, well-dressed, normal affect, normal respiratory effort. On examination there is no redness no cellulitis no signs of infection there is no signs of gout. Has well healed medial surgical incision. Patient has exquisite pain with light touch she has no dystrophic changes no skin color or temperature changes.   Imaging: No results found.  Labs: No results found for: HGBA1C, ESRSEDRATE, CRP, LABURIC, REPTSTATUS, GRAMSTAIN, CULT, LABORGA  Orders:  No orders of the defined types were placed in this encounter.  Meds ordered this  encounter  Medications  . pregabalin (LYRICA) 100 MG capsule    Sig: Take 1 capsule (100 mg total) by mouth 2 (two) times daily.    Dispense:  60 capsule    Refill:  2  . traMADol (ULTRAM) 50 MG tablet    Sig: Take 1 tablet (50 mg total) by mouth every 6 (six) hours as needed.    Dispense:  28 tablet    Refill:  0     Procedures: No procedures performed  Clinical Data: No additional findings.  ROS:  All other systems negative, except as noted in the HPI. Review of Systems  Constitutional: Negative for chills and fever.  Musculoskeletal: Positive for arthralgias.  Skin: Negative for color change.  Neurological: Negative for weakness and numbness.    Objective: Vital Signs: There were no vitals taken for this visit.  Specialty Comments:  No specialty comments available.  PMFS History: Patient Active Problem List   Diagnosis Date Noted  . 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 Retained orthopedic hardware   . Nonunion of foot fracture, right 07/13/2016  . DECREASED HEARING 10/13/2010  . ALLERGIC RHINITIS 10/13/2010  . PULMONARY NODULE 10/13/2010  . NEOPLASM, BENIGN, THYROID 09/22/2010  . HYPERLIPIDEMIA 09/22/2010   Past Medical History:  Diagnosis Date  . Arthritis   . Asthma   . Constipation   . Deaf    left ear  . Difficult intubation    Patient reports  that they had to use a small tube; MAC3, grade 1 view, 1 attempt, 7.0 ETT on 05/11/09  . Fibromyalgia   . GERD (gastroesophageal reflux disease)   . Hard of hearing    right  . Panic attack    Panic  . Seizures ([place])    "due to Tramadol"  . Shortness of breath dyspnea    with exertion    No family history on file.  Past Surgical History:  Procedure Laterality Date  . ABDOMINAL HYSTERECTOMY    . APPENDECTOMY     as child  . ARTHRODESIS METATARSAL Right 08/01/2016   Procedure: Take Down Non Union, Remove Hardware, Revision Fusion Right Great Toe;  Surgeon: Newt Minion, MD;   Location: [facility];  Service: Orthopedics;  Laterality: Right;  . ARTHRODESIS METATARSALPHALANGEAL JOINT (MTPJ) Right 05/02/2016   Procedure: ARTHRODESIS METATARSALPHALANGEAL JOINT (MTPJ) RIGHT GREAT TOE;  Surgeon: Newt Minion, MD;  Location: [facility];  Service: Orthopedics;  Laterality: Right;  . CERVICAL FUSION    . CESAREAN SECTION     x 2  . ORIF CLAVICULAR FRACTURE Right 05/05/2013   Procedure: OPEN REDUCTION INTERNAL FIXATION (ORIF) CLAVICULAR FRACTURE- right;  Surgeon: Meredith Pel, MD;  Location: [facility];  Service: Orthopedics;  Laterality: Right;  Right Clavicle Fracture Non Union Takedown and Fixation with Bone Graft, Coracoclavicular Ligament Reconstruction with Tight Rope.  Marland Kitchen SHOULDER SURGERY Right    x 3- fracture- screws,   . thumb operation    . TONSILLECTOMY AND ADENOIDECTOMY     as a child   Social History   Occupational History  . Not on file.   Social History Main Topics  . Smoking status: Current Every Day Smoker    Packs/day: 1.50    Years: 30.00  . Smokeless tobacco: Never Used  . Alcohol use No  . Drug use: No  . Sexual activity: Not on file

## 2017-04-25 ENCOUNTER — Encounter (HOSPITAL_COMMUNITY): Payer: Self-pay | Admitting: *Deleted

## 2017-04-25 ENCOUNTER — Other Ambulatory Visit (INDEPENDENT_AMBULATORY_CARE_PROVIDER_SITE_OTHER): Payer: Self-pay | Admitting: Family

## 2017-04-25 NOTE — Anesthesia Preprocedure Evaluation (Addendum)
Anesthesia Evaluation  Patient identified by MRN, date of birth, ID band Patient awake  General Assessment Comment:Difficult airway listed in history - intubated 2014 Mil 2 Grade 1 view per records  Reviewed: Allergy & Precautions, H&P , NPO status , Patient's Chart, lab work & pertinent test results, reviewed documented beta blocker date and time   History of Anesthesia Complications (+) DIFFICULT AIRWAY  Airway Mallampati: III  TM Distance: >3 FB Neck ROM: full    Dental  (+) Edentulous Upper, Edentulous Lower   Pulmonary shortness of breath and with exertion, asthma , COPD, Current Smoker,   Coarse bilaterally   + decreased breath sounds      Cardiovascular negative cardio ROS   Rhythm:regular Rate:Normal     Neuro/Psych Seizures -,  Anxiety Deafness    GI/Hepatic Neg liver ROS, GERD  Controlled and Medicated,  Endo/Other  negative endocrine ROS  Renal/GU negative Renal ROS  negative genitourinary   Musculoskeletal  (+) Arthritis , Fibromyalgia -  Abdominal   Peds  Hematology negative hematology ROS (+)   Anesthesia Other Findings   Reproductive/Obstetrics                           Anesthesia Physical  Anesthesia Plan  ASA: III  Anesthesia Plan: General   Post-op Pain Management:    Induction:   PONV Risk Score and Plan: 3 and Ondansetron, Dexamethasone, Midazolam, Propofol infusion and Treatment may vary due to age or medical condition  Airway Management Planned: LMA  Additional Equipment: None  Intra-op Plan:   Post-operative Plan: Extubation in OR  Informed Consent: I have reviewed the patients History and Physical, chart, labs and discussed the procedure including the risks, benefits and alternatives for the proposed anesthesia with the patient or authorized representative who has indicated his/her understanding and acceptance.   Dental advisory given  Plan  Discussed with: CRNA  Anesthesia Plan Comments:        Anesthesia Quick Evaluation

## 2017-04-25 NOTE — Progress Notes (Signed)
Susan Short is deaf, uses sign language, I spoke with patient's  friend Susan Short, who asked patient questions name, birthday, surgery, then Susan Short instructed me to call her mother , Susan Short and she gave me her phone number.  I spoke to Susan Short who answered the rest of the questions. Susan Short said that she will not be staying with patient after surgery, for "Dr to tell patient that she would be just fine by herself." I informed Susan Short that patient does need someone with her the first 24 hours after surgery.  Susan Short said she would get patient's friend to stay with her.

## 2017-04-26 ENCOUNTER — Ambulatory Visit (HOSPITAL_COMMUNITY): Payer: Medicaid Other | Admitting: Certified Registered"

## 2017-04-26 ENCOUNTER — Encounter (HOSPITAL_COMMUNITY): Payer: Self-pay | Admitting: *Deleted

## 2017-04-26 ENCOUNTER — Encounter (HOSPITAL_COMMUNITY)
Admission: RE | Disposition: A | Discharge status: Home / Self Care | Payer: Self-pay | Source: Ambulatory Visit | Attending: Orthopedic Surgery

## 2017-04-26 ENCOUNTER — Ambulatory Visit (HOSPITAL_COMMUNITY)
Admission: RE | Admit: 2017-04-26 | Discharge: 2017-04-26 | Disposition: A | Discharge status: Home / Self Care | Payer: Medicaid Other | Source: Ambulatory Visit | Attending: Orthopedic Surgery | Admitting: Orthopedic Surgery

## 2017-04-26 DIAGNOSIS — M960 Pseudarthrosis after fusion or arthrodesis: Secondary | ICD-10-CM

## 2017-04-26 DIAGNOSIS — M797 Fibromyalgia: Secondary | ICD-10-CM | POA: Insufficient documentation

## 2017-04-26 DIAGNOSIS — F17200 Nicotine dependence, unspecified, uncomplicated: Secondary | ICD-10-CM | POA: Diagnosis not present

## 2017-04-26 DIAGNOSIS — X58XXXD Exposure to other specified factors, subsequent encounter: Secondary | ICD-10-CM | POA: Insufficient documentation

## 2017-04-26 DIAGNOSIS — M1990 Unspecified osteoarthritis, unspecified site: Secondary | ICD-10-CM | POA: Insufficient documentation

## 2017-04-26 DIAGNOSIS — K219 Gastro-esophageal reflux disease without esophagitis: Secondary | ICD-10-CM | POA: Diagnosis not present

## 2017-04-26 DIAGNOSIS — S92401K Displaced unspecified fracture of right great toe, subsequent encounter for fracture with nonunion: Secondary | ICD-10-CM | POA: Diagnosis present

## 2017-04-26 DIAGNOSIS — J449 Chronic obstructive pulmonary disease, unspecified: Secondary | ICD-10-CM | POA: Diagnosis not present

## 2017-04-26 DIAGNOSIS — S92901K Unspecified fracture of right foot, subsequent encounter for fracture with nonunion: Secondary | ICD-10-CM

## 2017-04-26 DIAGNOSIS — Z969 Presence of functional implant, unspecified: Secondary | ICD-10-CM

## 2017-04-26 HISTORY — PX: AMPUTATION: SHX166

## 2017-04-26 LAB — CBC
HCT: 38.3 % (ref 36.0–46.0)
Hemoglobin: 12.9 g/dL (ref 12.0–15.0)
MCH: 30 pg (ref 26.0–34.0)
MCHC: 33.7 g/dL (ref 30.0–36.0)
MCV: 89.1 fL (ref 78.0–100.0)
PLATELETS: 227 10*3/uL (ref 150–400)
RBC: 4.3 MIL/uL (ref 3.87–5.11)
RDW: 12.6 % (ref 11.5–15.5)
WBC: 6.1 10*3/uL (ref 4.0–10.5)

## 2017-04-26 LAB — BASIC METABOLIC PANEL
Anion gap: 10 (ref 5–15)
BUN: <5 mg/dL — ABNORMAL LOW (ref 6–20)
CALCIUM: 9.1 mg/dL (ref 8.9–10.3)
CO2: 30 mmol/L (ref 22–32)
CREATININE: 0.66 mg/dL (ref 0.44–1.00)
Chloride: 99 mmol/L — ABNORMAL LOW (ref 101–111)
GFR calc non Af Amer: >60 mL/min (ref >60)
Glucose, Bld: 90 mg/dL (ref 65–99)
Potassium: 2.5 mmol/L — CL (ref 3.5–5.1)
SODIUM: 139 mmol/L (ref 135–145)

## 2017-04-26 LAB — SURGICAL PCR SCREEN
MRSA, PCR: POSITIVE — AB
Staphylococcus aureus: POSITIVE — AB

## 2017-04-26 SURGERY — AMPUTATION DIGIT
Anesthesia: General | Site: Foot | Laterality: Right

## 2017-04-26 MED ORDER — DEXAMETHASONE SODIUM PHOSPHATE 10 MG/ML IJ SOLN
INTRAMUSCULAR | Status: AC
  Filled 2017-04-26: qty 1

## 2017-04-26 MED ORDER — LACTATED RINGERS IV SOLN
INTRAVENOUS | Status: DC | PRN
  Administered 2017-04-26: 07:00:00 via INTRAVENOUS

## 2017-04-26 MED ORDER — CEFAZOLIN SODIUM-DEXTROSE 2-4 GM/100ML-% IV SOLN
2.0000 g | INTRAVENOUS | Status: AC
  Administered 2017-04-26: 2 g via INTRAVENOUS
  Filled 2017-04-26: qty 100

## 2017-04-26 MED ORDER — OXYCODONE-ACETAMINOPHEN 5-325 MG PO TABS
ORAL_TABLET | ORAL | Status: AC
  Administered 2017-04-26: 1 via ORAL
  Filled 2017-04-26: qty 1

## 2017-04-26 MED ORDER — MUPIROCIN 2 % EX OINT
TOPICAL_OINTMENT | CUTANEOUS | Status: AC
  Administered 2017-04-26: 07:00:00
  Filled 2017-04-26: qty 22

## 2017-04-26 MED ORDER — FENTANYL CITRATE (PF) 250 MCG/5ML IJ SOLN
INTRAMUSCULAR | Status: DC | PRN
  Administered 2017-04-26: 50 ug via INTRAVENOUS
  Administered 2017-04-26: 75 ug via INTRAVENOUS
  Administered 2017-04-26: 25 ug via INTRAVENOUS

## 2017-04-26 MED ORDER — FENTANYL CITRATE (PF) 100 MCG/2ML IJ SOLN
25.0000 ug | INTRAMUSCULAR | Status: DC | PRN
  Administered 2017-04-26 (×2): 25 ug via INTRAVENOUS
  Administered 2017-04-26: 50 ug via INTRAVENOUS

## 2017-04-26 MED ORDER — FENTANYL CITRATE (PF) 250 MCG/5ML IJ SOLN
INTRAMUSCULAR | Status: AC
  Filled 2017-04-26: qty 5

## 2017-04-26 MED ORDER — LIDOCAINE 2% (20 MG/ML) 5 ML SYRINGE
INTRAMUSCULAR | Status: AC
  Filled 2017-04-26: qty 5

## 2017-04-26 MED ORDER — OXYCODONE-ACETAMINOPHEN 5-325 MG PO TABS
1.0000 | ORAL_TABLET | Freq: Once | ORAL | Status: AC
  Administered 2017-04-26: 1 via ORAL

## 2017-04-26 MED ORDER — PHENYLEPHRINE 40 MCG/ML (10ML) SYRINGE FOR IV PUSH (FOR BLOOD PRESSURE SUPPORT)
PREFILLED_SYRINGE | INTRAVENOUS | Status: DC | PRN
  Administered 2017-04-26 (×2): 80 ug via INTRAVENOUS

## 2017-04-26 MED ORDER — ONDANSETRON HCL 4 MG/2ML IJ SOLN
INTRAMUSCULAR | Status: DC | PRN
  Administered 2017-04-26: 4 mg via INTRAVENOUS

## 2017-04-26 MED ORDER — PROPOFOL 10 MG/ML IV BOLUS
INTRAVENOUS | Status: AC
  Filled 2017-04-26: qty 20

## 2017-04-26 MED ORDER — ONDANSETRON HCL 4 MG/2ML IJ SOLN
INTRAMUSCULAR | Status: AC
  Filled 2017-04-26: qty 2

## 2017-04-26 MED ORDER — MIDAZOLAM HCL 2 MG/2ML IJ SOLN
INTRAMUSCULAR | Status: DC | PRN
  Administered 2017-04-26: 2 mg via INTRAVENOUS

## 2017-04-26 MED ORDER — PROPOFOL 10 MG/ML IV BOLUS
INTRAVENOUS | Status: DC | PRN
  Administered 2017-04-26: 150 mg via INTRAVENOUS

## 2017-04-26 MED ORDER — CHLORHEXIDINE GLUCONATE 4 % EX LIQD: 60.0000 mL | Freq: Once | CUTANEOUS | Status: DC

## 2017-04-26 MED ORDER — FENTANYL CITRATE (PF) 100 MCG/2ML IJ SOLN
INTRAMUSCULAR | Status: AC
  Administered 2017-04-26: 50 ug via INTRAVENOUS
  Filled 2017-04-26: qty 2

## 2017-04-26 MED ORDER — DEXAMETHASONE SODIUM PHOSPHATE 10 MG/ML IJ SOLN
INTRAMUSCULAR | Status: DC | PRN
  Administered 2017-04-26: 10 mg via INTRAVENOUS

## 2017-04-26 MED ORDER — POTASSIUM CHLORIDE CRYS ER 20 MEQ PO TBCR
40.0000 meq | EXTENDED_RELEASE_TABLET | Freq: Once | ORAL | Status: AC
  Administered 2017-04-26: 40 meq via ORAL
  Filled 2017-04-26 (×2): qty 2

## 2017-04-26 MED ORDER — 0.9 % SODIUM CHLORIDE (POUR BTL) OPTIME
TOPICAL | Status: DC | PRN
  Administered 2017-04-26: 1000 mL

## 2017-04-26 MED ORDER — MIDAZOLAM HCL 2 MG/2ML IJ SOLN
INTRAMUSCULAR | Status: AC
  Filled 2017-04-26: qty 2

## 2017-04-26 MED ORDER — OXYCODONE HCL 5 MG PO TABS
10.0000 mg | ORAL_TABLET | Freq: Once | ORAL | Status: AC
  Administered 2017-04-26: 5 mg via ORAL

## 2017-04-26 MED ORDER — OXYCODONE-ACETAMINOPHEN 5-325 MG PO TABS: 1.0000 | ORAL_TABLET | ORAL | 0 refills | Status: DC | PRN

## 2017-04-26 MED ORDER — OXYCODONE-ACETAMINOPHEN 10-325 MG PO TABS: 1.0000 | ORAL_TABLET | Freq: Four times a day (QID) | ORAL | 0 refills | Status: DC | PRN

## 2017-04-26 MED ORDER — OXYCODONE HCL 5 MG PO TABS
ORAL_TABLET | ORAL | Status: AC
  Filled 2017-04-26: qty 1

## 2017-04-26 MED ORDER — PROMETHAZINE HCL 25 MG/ML IJ SOLN: 6.2500 mg | INTRAMUSCULAR | Status: DC | PRN

## 2017-04-26 MED ORDER — LIDOCAINE 2% (20 MG/ML) 5 ML SYRINGE
INTRAMUSCULAR | Status: DC | PRN
  Administered 2017-04-26: 80 mg via INTRAVENOUS

## 2017-04-26 MED ORDER — GLYCOPYRROLATE 0.2 MG/ML IJ SOLN
INTRAMUSCULAR | Status: DC | PRN
  Administered 2017-04-26: 0.2 mg via INTRAVENOUS

## 2017-04-26 SURGICAL SUPPLY — 29 items
BLADE SURG 21 STRL SS (BLADE) ×3 IMPLANT
BNDG CMPR 9X4 STRL LF SNTH (GAUZE/BANDAGES/DRESSINGS) ×1
BNDG COHESIVE 4X5 TAN STRL (GAUZE/BANDAGES/DRESSINGS) ×3 IMPLANT
BNDG ESMARK 4X9 LF (GAUZE/BANDAGES/DRESSINGS) ×2 IMPLANT
BNDG GAUZE ELAST 4 BULKY (GAUZE/BANDAGES/DRESSINGS) ×3 IMPLANT
COVER SURGICAL LIGHT HANDLE (MISCELLANEOUS) ×6 IMPLANT
DRAPE U-SHAPE 47X51 STRL (DRAPES) ×3 IMPLANT
DRSG ADAPTIC 3X8 NADH LF (GAUZE/BANDAGES/DRESSINGS) ×2 IMPLANT
DRSG PAD ABDOMINAL 8X10 ST (GAUZE/BANDAGES/DRESSINGS) ×3 IMPLANT
DURAPREP 26ML APPLICATOR (WOUND CARE) ×3 IMPLANT
ELECT REM PT RETURN 9FT ADLT (ELECTROSURGICAL) ×3
ELECTRODE REM PT RTRN 9FT ADLT (ELECTROSURGICAL) ×1 IMPLANT
GAUZE SPONGE 4X4 12PLY STRL (GAUZE/BANDAGES/DRESSINGS) ×2 IMPLANT
GLOVE BIOGEL PI IND STRL 9 (GLOVE) ×1 IMPLANT
GLOVE BIOGEL PI INDICATOR 9 (GLOVE) ×2
GLOVE SURG ORTHO 9.0 STRL STRW (GLOVE) ×3 IMPLANT
GOWN STRL REUS W/ TWL XL LVL3 (GOWN DISPOSABLE) ×2 IMPLANT
GOWN STRL REUS W/TWL XL LVL3 (GOWN DISPOSABLE) ×6
KIT BASIN OR (CUSTOM PROCEDURE TRAY) ×3 IMPLANT
KIT ROOM TURNOVER OR (KITS) ×3 IMPLANT
MANIFOLD NEPTUNE II (INSTRUMENTS) ×3 IMPLANT
NEEDLE 22X1 1/2 (OR ONLY) (NEEDLE) ×2 IMPLANT
NS IRRIG 1000ML POUR BTL (IV SOLUTION) ×3 IMPLANT
PACK ORTHO EXTREMITY (CUSTOM PROCEDURE TRAY) ×3 IMPLANT
PAD ABD 8X10 STRL (GAUZE/BANDAGES/DRESSINGS) ×2 IMPLANT
PAD ARMBOARD 7.5X6 YLW CONV (MISCELLANEOUS) ×6 IMPLANT
SUT ETHILON 2 0 PSLX (SUTURE) ×3 IMPLANT
SYR CONTROL 10ML LL (SYRINGE) IMPLANT
TOWEL OR 17X26 10 PK STRL BLUE (TOWEL DISPOSABLE) ×3 IMPLANT

## 2017-04-26 NOTE — Progress Notes (Signed)
Orthopedic Tech Progress Note Patient Details:  Susan Short September 09, 1970 [id number]  Ortho Devices Type of Ortho Device: CAM walker Ortho Device/Splint Interventions: Application   [name] 04/26/2017, 10:32 AM

## 2017-04-26 NOTE — Transfer of Care (Signed)
Immediate Anesthesia Transfer of Care Note  Patient: Susan Short  Procedure(s) Performed: Procedure(s): Right Great Toe Amputation (Right)  Patient Location: PACU  Anesthesia Type:General  Level of Consciousness: awake  Airway & Oxygen Therapy: Patient Spontanous Breathing  Post-op Assessment: Report given to RN  Post vital signs: Reviewed and stable  Last Vitals:  Vitals:   04/26/17 0548 04/26/17 0808  BP: 134/71 (!) 173/96  Pulse: (!) 53 85  Resp: 19 14  Temp: 36.9 C   SpO2: 98% 99%    Last Pain:  Vitals:   04/26/17 0633  TempSrc:   PainSc: 8       Patients Stated Pain Goal: 1 (41/36/43 8377)  Complications: No apparent anesthesia complications

## 2017-04-26 NOTE — Progress Notes (Signed)
Instructions for Mupirocin Application given to patient with interpreter-Kelly.

## 2017-04-26 NOTE — Anesthesia Postprocedure Evaluation (Signed)
Anesthesia Post Note  Patient: Susan Short  Procedure(s) Performed: Procedure(s) (LRB): Right Great Toe Amputation (Right)     Patient location during evaluation: PACU Anesthesia Type: General Level of consciousness: awake and alert Pain management: pain level controlled Vital Signs Assessment: post-procedure vital signs reviewed and stable Respiratory status: spontaneous breathing, nonlabored ventilation and respiratory function stable Cardiovascular status: blood pressure returned to baseline and stable Postop Assessment: no apparent nausea or vomiting Anesthetic complications: no    Last Vitals:  Vitals:   04/26/17 0845 04/26/17 0900  BP:  (!) 156/94  Pulse: 65 77  Resp: 13 14  Temp:    SpO2: 95% 95%    Last Pain:  Vitals:   04/26/17 0835  TempSrc:   PainSc: [place]

## 2017-04-26 NOTE — Anesthesia Procedure Notes (Signed)
Procedure Name: LMA Insertion Date/Time: 04/26/2017 7:36 AM Performed by: Sampson Si E Pre-anesthesia Checklist: Patient identified, Emergency Drugs available, Suction available and Patient being monitored Patient Re-evaluated:Patient Re-evaluated prior to induction Oxygen Delivery Method: Circle System Utilized Preoxygenation: Pre-oxygenation with 100% oxygen Induction Type: IV induction Ventilation: Mask ventilation without difficulty LMA: LMA inserted LMA Size: 4.0 Number of attempts: 1 Placement Confirmation: positive ETCO2 Tube secured with: Tape Dental Injury: Teeth and Oropharynx as per pre-operative assessment

## 2017-04-26 NOTE — H&P (Signed)
Susan Short is an 46 y.o. female.   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  Past Medical History:  Diagnosis Date  . Arthritis   . Asthma   . Constipation   . Deaf    left ear  . Difficult intubation    Patient reports that they had to use a small tube; MAC3, grade 1 view, 1 attempt, 7.0 ETT on 05/11/09  . Fibromyalgia   . GERD (gastroesophageal reflux disease)   . Hard of hearing    right  . Panic attack    Panic  . Seizures ([place])    "due to Tramadol"  . Shortness of breath dyspnea    with exertion    Past Surgical History:  Procedure Laterality Date  . ABDOMINAL HYSTERECTOMY    . APPENDECTOMY     as child  . ARTHRODESIS METATARSAL Right 08/01/2016   Procedure: Take Down Non Union, Remove Hardware, Revision Fusion Right Great Toe;  Surgeon: Newt Minion, MD;  Location: [facility];  Service: Orthopedics;  Laterality: Right;  . ARTHRODESIS METATARSALPHALANGEAL JOINT (MTPJ) Right 05/02/2016   Procedure: ARTHRODESIS METATARSALPHALANGEAL JOINT (MTPJ) RIGHT GREAT TOE;  Surgeon: Newt Minion, MD;  Location: [facility];  Service: Orthopedics;  Laterality: Right;  . CERVICAL FUSION    . CESAREAN SECTION     x 2  . ORIF CLAVICULAR FRACTURE Right 05/05/2013   Procedure: OPEN REDUCTION INTERNAL FIXATION (ORIF) CLAVICULAR FRACTURE- right;  Surgeon: Meredith Pel, MD;  Location: [facility];  Service: Orthopedics;  Laterality: Right;  Right Clavicle Fracture Non Union Takedown and Fixation with Bone Graft, Coracoclavicular Ligament Reconstruction with Tight Rope.  Marland Kitchen SHOULDER SURGERY Right    x 3- fracture- screws,   . thumb operation    . TONSILLECTOMY AND ADENOIDECTOMY     as a child    History reviewed. No pertinent family history. Social History:  reports that she has been smoking.  She has a 35.00 pack-year smoking history. She has never  used smokeless tobacco. She reports that she does not drink alcohol or use drugs.  Allergies:  Allergies  Allergen Reactions  . Tramadol Other (See Comments)    seizures   . Ketorolac Tromethamine Hives  . Nsaids Hives  . Ibuprofen Hives and Rash    Medications Prior to Admission  Medication Sig Dispense Refill  . albuterol (PROVENTIL HFA;VENTOLIN HFA) 108 (90 BASE) MCG/ACT inhaler Inhale 2 puffs into the lungs every 6 (six) hours as needed for wheezing or shortness of breath.     Marland Kitchen amitriptyline (ELAVIL) 50 MG tablet Take 250 mg by mouth at bedtime.     . Cholecalciferol 50000 units TABS Take 1 tablet by mouth once a week. (Patient taking differently: Take 1 tablet by mouth once a week. Wednesday or Thursday) 12 tablet 0  . Multiple Vitamin (MULTIVITAMIN WITH MINERALS) TABS tablet Take 1 tablet by mouth daily.    Marland Kitchen omeprazole (PRILOSEC) 40 MG capsule Take 40 mg by mouth 2 (two) times daily.     . pregabalin (LYRICA) 100 MG capsule Take 1 capsule (100 mg total) by mouth 2 (two) times daily. 60 capsule 2  . traMADol (ULTRAM) 50 MG tablet Take 1 tablet (50 mg total) by mouth every 6 (six) hours as needed. 28 tablet 0    No results found for this or any previous visit (  from the past 48 hour(s)). No results found.  Review of Systems  Endo/Heme/Allergies: Polydipsia: painful nonunion right great toe MTP joint.  All other systems reviewed and are negative.   Blood pressure 134/71, pulse (!) 53, temperature 98.5 F (36.9 C), temperature source Oral, resp. rate 19, SpO2 98 %. Physical Exam  Patient is alert oriented no adenopathy well-dressed normal affect normal respiratory effort she has an antalgic gait.Examination patient has a palpable pulse she has no redness no cellulitis no signs of infection. She has pain to palpation and pain with attempted distraction across the fusion site. Assessment/Plan assessment: Fibrous nonunion great toe MTP fusion.  Plan: We'll plan for amputation  of the right great toe. Risk and benefits were discussed including risk of the wound not healing need for additional surgery need for higher level amputation. Patient states she understands wish to see.  Newt Minion, MD 04/26/2017, 6:27 AM

## 2017-04-26 NOTE — Progress Notes (Signed)
Claiborne Billings interpreter at bedside. Explained to patient that we can't get the pain totally away but we can make it tolerable. Patient dozing in and out of sleep as of this time.

## 2017-04-26 NOTE — Progress Notes (Signed)
Dr Fransisco Beau called and informed of potassium level.

## 2017-04-26 NOTE — Op Note (Signed)
04/26/2017  7:55 AM  PATIENT:  Susan Short    PRE-OPERATIVE DIAGNOSIS:  Non-union Right Great Toe Fracture Retained Synthes staples 2  POST-OPERATIVE DIAGNOSIS:  Same  PROCEDURE:  Right Great Toe Amputation at the MTP joint. Removal of deep retained hardware.  SURGEON:  Newt Minion, MD  PHYSICIAN ASSISTANT:None ANESTHESIA:   General  PREOPERATIVE INDICATIONS:  Susan Short is a  46 y.o. female with a diagnosis of Non-union Right Great Toe Fracture who failed conservative measures and elected for surgical management.    The risks benefits and alternatives were discussed with the patient preoperatively including but not limited to the risks of infection, bleeding, nerve injury, cardiopulmonary complications, the need for revision surgery, among others, and the patient was willing to proceed.  OPERATIVE IMPLANTS: None  OPERATIVE FINDINGS: Synthes staples removed 2  OPERATIVE PROCEDURE: Patient was brought to the operating room and underwent a general anesthetic. After adequate levels of anesthesia were obtained patient's right lower extremity was prepped using DuraPrep draped into a sterile field a timeout was called. A racquet incision was made over distal to the MTP joint. Blunt dissection was carried down to fibrous tissue. Osteotomes were used to remove the fibrous tissue the staples were removed 2. The toe was amputated through the MTP joint. There was a fibrous nonunion at the MTP joint of the great toe. Electrocautery was used for hemostasis wound was irrigated with normal saline there is no abscess no signs of infection. The incision was closed using 2-0 nylon. A sterile dressing was applied patient was extubated taken to the PACU in stable condition.

## 2017-04-27 ENCOUNTER — Encounter (HOSPITAL_COMMUNITY): Payer: Self-pay | Admitting: Orthopedic Surgery

## 2017-05-02 ENCOUNTER — Inpatient Hospital Stay (INDEPENDENT_AMBULATORY_CARE_PROVIDER_SITE_OTHER): Payer: Medicaid Other | Admitting: Orthopedic Surgery

## 2017-05-06 ENCOUNTER — Ambulatory Visit (INDEPENDENT_AMBULATORY_CARE_PROVIDER_SITE_OTHER): Payer: Medicaid Other | Admitting: Orthopedic Surgery

## 2017-05-06 ENCOUNTER — Encounter (INDEPENDENT_AMBULATORY_CARE_PROVIDER_SITE_OTHER): Payer: Self-pay | Admitting: Orthopedic Surgery

## 2017-05-06 DIAGNOSIS — S98131A Complete traumatic amputation of one right lesser toe, initial encounter: Secondary | ICD-10-CM

## 2017-05-06 DIAGNOSIS — Z89421 Acquired absence of other right toe(s): Secondary | ICD-10-CM

## 2017-05-06 MED ORDER — OXYCODONE-ACETAMINOPHEN 10-325 MG PO TABS: 1.0000 | ORAL_TABLET | Freq: Four times a day (QID) | ORAL | 0 refills | Status: DC | PRN

## 2017-05-06 NOTE — Progress Notes (Signed)
Office Visit Note   Patient: Susan Short           Date of Birth: April 09, 1971           MRN: [id number] Visit Date: 05/06/2017              Requested by: Antonietta Jewel, MD [address] [place], [place] [address] PCP: Antonietta Jewel, MD  Chief Complaint  Patient presents with  . Right Foot - Follow-up    04/26/17 R GT Amputation 10 days post op      HPI: Patient is 10 days status post amputation right great toe. She has been using Percocet for pain states she still has pain and states that she's had a fever. We will check her temperature today  Assessment & Plan: Visit Diagnoses:  1. Amputated toe, right ([place])     Plan: Patient is given a refill prescription for Percocet. Recommended protected weightbearing. Discussed the importance of deep breathing and coughing to decrease risk of pneumonia.  Follow-Up Instructions: Return in about 1 week (around 05/13/2017).   Ortho Exam  Patient is alert, oriented, no adenopathy, well-dressed, normal affect, normal respiratory effort. On examination patient has no redness no cellulitis she has some mild swelling around the incision the wound edges are well approximated there is no drainage.  Temperature is 98.4 no signs of infection.  Imaging: No results found. No images are attached to the encounter.  Labs: No results found for: HGBA1C, ESRSEDRATE, CRP, LABURIC, REPTSTATUS, GRAMSTAIN, CULT, LABORGA  Orders:  No orders of the defined types were placed in this encounter.  Meds ordered this encounter  Medications  . oxyCODONE-acetaminophen (PERCOCET) 10-325 MG tablet    Sig: Take 1 tablet by mouth every 6 (six) hours as needed for pain.    Dispense:  30 tablet    Refill:  0     Procedures: No procedures performed  Clinical Data: No additional findings.  ROS:  All other systems negative, except as noted in the HPI. Review of Systems  Objective: Vital Signs: There were no vitals taken for this  visit.  Specialty Comments:  No specialty comments available.  PMFS History: Patient Active Problem List   Diagnosis Date Noted  . 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 Retained orthopedic hardware   . Nonunion of foot fracture, right 07/13/2016  . DECREASED HEARING 10/13/2010  . ALLERGIC RHINITIS 10/13/2010  . PULMONARY NODULE 10/13/2010  . NEOPLASM, BENIGN, THYROID 09/22/2010  . HYPERLIPIDEMIA 09/22/2010   Past Medical History:  Diagnosis Date  . Arthritis   . Asthma   . Constipation   . Deaf    left ear  . Difficult intubation    Patient reports that they had to use a small tube; MAC3, grade 1 view, 1 attempt, 7.0 ETT on 05/11/09  . Fibromyalgia   . GERD (gastroesophageal reflux disease)   . Hard of hearing    right  . Panic attack    Panic  . Seizures ([place])    "due to Tramadol"  . Shortness of breath dyspnea    with exertion    History reviewed. No pertinent family history.  Past Surgical History:  Procedure Laterality Date  . ABDOMINAL HYSTERECTOMY    . AMPUTATION Right 04/26/2017   Procedure: Right Great Toe Amputation;  Surgeon: Newt Minion, MD;  Location: [facility];  Service: Orthopedics;  Laterality: Right;  . APPENDECTOMY     as child  . ARTHRODESIS METATARSAL Right 08/01/2016  Procedure: Take Down Non Union, Remove Hardware, Revision Fusion Right Great Toe;  Surgeon: Newt Minion, MD;  Location: [facility];  Service: Orthopedics;  Laterality: Right;  . ARTHRODESIS METATARSALPHALANGEAL JOINT (MTPJ) Right 05/02/2016   Procedure: ARTHRODESIS METATARSALPHALANGEAL JOINT (MTPJ) RIGHT GREAT TOE;  Surgeon: Newt Minion, MD;  Location: [facility];  Service: Orthopedics;  Laterality: Right;  . CERVICAL FUSION    . CESAREAN SECTION     x 2  . ORIF CLAVICULAR FRACTURE Right 05/05/2013   Procedure: OPEN REDUCTION INTERNAL FIXATION (ORIF) CLAVICULAR FRACTURE- right;  Surgeon: Meredith Pel, MD;  Location: [facility];  Service: Orthopedics;  Laterality: Right;   Right Clavicle Fracture Non Union Takedown and Fixation with Bone Graft, Coracoclavicular Ligament Reconstruction with Tight Rope.  Marland Kitchen SHOULDER SURGERY Right    x 3- fracture- screws,   . thumb operation    . TONSILLECTOMY AND ADENOIDECTOMY     as a child   Social History   Occupational History  . Not on file.   Social History Main Topics  . Smoking status: Current Every Day Smoker    Packs/day: 1.00    Years: 35.00  . Smokeless tobacco: Never Used  . Alcohol use No  . Drug use: No  . Sexual activity: Not on file

## 2017-05-13 ENCOUNTER — Ambulatory Visit (INDEPENDENT_AMBULATORY_CARE_PROVIDER_SITE_OTHER): Payer: Medicaid Other | Admitting: Family

## 2017-05-15 ENCOUNTER — Ambulatory Visit (INDEPENDENT_AMBULATORY_CARE_PROVIDER_SITE_OTHER): Payer: Medicaid Other | Admitting: Orthopedic Surgery

## 2017-05-15 ENCOUNTER — Encounter (INDEPENDENT_AMBULATORY_CARE_PROVIDER_SITE_OTHER): Payer: Self-pay | Admitting: Orthopedic Surgery

## 2017-05-15 ENCOUNTER — Ambulatory Visit (INDEPENDENT_AMBULATORY_CARE_PROVIDER_SITE_OTHER): Payer: Medicaid Other | Admitting: Family

## 2017-05-15 DIAGNOSIS — Z89411 Acquired absence of right great toe: Secondary | ICD-10-CM

## 2017-05-15 DIAGNOSIS — IMO0002 Reserved for concepts with insufficient information to code with codable children: Secondary | ICD-10-CM | POA: Insufficient documentation

## 2017-05-15 MED ORDER — OXYCODONE-ACETAMINOPHEN 10-325 MG PO TABS: 1.0000 | ORAL_TABLET | Freq: Three times a day (TID) | ORAL | 0 refills | Status: DC | PRN

## 2017-05-15 NOTE — Progress Notes (Signed)
Post-Op Visit Note   Patient: Susan Short           Date of Birth: 08/27/1970           MRN: [id number] Visit Date: 05/15/2017 PCP: Antonietta Jewel, MD  Chief Complaint:  Chief Complaint  Patient presents with  . Right Foot - Routine Post Op    HPI:  HPI The patient is a 46 year old woman who is 19 days status post right great toe amputation. Has been minimizing weightbearing in fracture boot. Continues to have swelling and shooting burning pains up medial column of right foot.   Ortho Exam incision well approximated with sutures. It is healing well. No drainage no erythema and minimal swelling.  Visit Diagnoses:  1. Great toe amputation status, right ([place])     Plan: continue daily dial soap cleansing and dry dressings. Sutures harvested today. Begin touch down weight bearing on right follow up in office in 2 weeks.   Follow-Up Instructions: Return in about 2 weeks (around 05/29/2017).   Imaging: No results found.  Orders:  No orders of the defined types were placed in this encounter.  Meds ordered this encounter  Medications  . oxyCODONE-acetaminophen (PERCOCET) 10-325 MG tablet    Sig: Take 1 tablet by mouth every 8 (eight) hours as needed for pain.    Dispense:  30 tablet    Refill:  0     PMFS History: Patient Active Problem List   Diagnosis Date Noted  . Great toe amputation status, right ([place]) 05/15/2017  . 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 Retained orthopedic hardware   . Nonunion of foot fracture, right 07/13/2016  . DECREASED HEARING 10/13/2010  . ALLERGIC RHINITIS 10/13/2010  . PULMONARY NODULE 10/13/2010  . NEOPLASM, BENIGN, THYROID 09/22/2010  . HYPERLIPIDEMIA 09/22/2010   Past Medical History:  Diagnosis Date  . Arthritis   . Asthma   . Constipation   . Deaf    left ear  . Difficult intubation    Patient reports that they had to use a small tube; MAC3, grade 1 view, 1 attempt, 7.0 ETT on 05/11/09  . Fibromyalgia    . GERD (gastroesophageal reflux disease)   . Hard of hearing    right  . Panic attack    Panic  . Seizures ([place])    "due to Tramadol"  . Shortness of breath dyspnea    with exertion    No family history on file.  Past Surgical History:  Procedure Laterality Date  . ABDOMINAL HYSTERECTOMY    . AMPUTATION Right 04/26/2017   Procedure: Right Great Toe Amputation;  Surgeon: Newt Minion, MD;  Location: [facility];  Service: Orthopedics;  Laterality: Right;  . APPENDECTOMY     as child  . ARTHRODESIS METATARSAL Right 08/01/2016   Procedure: Take Down Non Union, Remove Hardware, Revision Fusion Right Great Toe;  Surgeon: Newt Minion, MD;  Location: [facility];  Service: Orthopedics;  Laterality: Right;  . ARTHRODESIS METATARSALPHALANGEAL JOINT (MTPJ) Right 05/02/2016   Procedure: ARTHRODESIS METATARSALPHALANGEAL JOINT (MTPJ) RIGHT GREAT TOE;  Surgeon: Newt Minion, MD;  Location: [facility];  Service: Orthopedics;  Laterality: Right;  . CERVICAL FUSION    . CESAREAN SECTION     x 2  . ORIF CLAVICULAR FRACTURE Right 05/05/2013   Procedure: OPEN REDUCTION INTERNAL FIXATION (ORIF) CLAVICULAR FRACTURE- right;  Surgeon: Meredith Pel, MD;  Location: [facility];  Service: Orthopedics;  Laterality: Right;  Right Clavicle Fracture Non Union  Takedown and Fixation with Bone Graft, Coracoclavicular Ligament Reconstruction with Tight Rope.  Marland Kitchen SHOULDER SURGERY Right    x 3- fracture- screws,   . thumb operation    . TONSILLECTOMY AND ADENOIDECTOMY     as a child   Social History   Occupational History  . Not on file.   Social History Main Topics  . Smoking status: Current Every Day Smoker    Packs/day: 1.00    Years: 35.00  . Smokeless tobacco: Never Used  . Alcohol use No  . Drug use: No  . Sexual activity: Not on file

## 2017-05-20 ENCOUNTER — Telehealth (INDEPENDENT_AMBULATORY_CARE_PROVIDER_SITE_OTHER): Payer: Self-pay

## 2017-05-20 ENCOUNTER — Other Ambulatory Visit (INDEPENDENT_AMBULATORY_CARE_PROVIDER_SITE_OTHER): Payer: Self-pay | Admitting: Orthopedic Surgery

## 2017-05-20 ENCOUNTER — Telehealth (INDEPENDENT_AMBULATORY_CARE_PROVIDER_SITE_OTHER): Payer: Self-pay | Admitting: Radiology

## 2017-05-20 MED ORDER — OXYCODONE-ACETAMINOPHEN 10-325 MG PO TABS: 1.0000 | ORAL_TABLET | Freq: Three times a day (TID) | ORAL | 0 refills | Status: DC | PRN

## 2017-05-20 NOTE — Telephone Encounter (Signed)
Also did prior authorization for percocet rx written today. Approved by [facility] confirmation # [id number] and prior [phone]

## 2017-05-20 NOTE — Telephone Encounter (Signed)
Patient would like a Rx refill on Percocet.  Cb# is [phone].  Please advise.  Thank You.

## 2017-05-20 NOTE — Telephone Encounter (Signed)
I called and left vm to advise rx at front desk for pick up.

## 2017-05-20 NOTE — Telephone Encounter (Signed)
rx written

## 2017-05-29 ENCOUNTER — Ambulatory Visit (INDEPENDENT_AMBULATORY_CARE_PROVIDER_SITE_OTHER): Payer: Medicaid Other | Admitting: Family

## 2017-05-29 ENCOUNTER — Ambulatory Visit (INDEPENDENT_AMBULATORY_CARE_PROVIDER_SITE_OTHER): Payer: Medicaid Other | Admitting: Orthopedic Surgery

## 2017-05-29 ENCOUNTER — Encounter (INDEPENDENT_AMBULATORY_CARE_PROVIDER_SITE_OTHER): Payer: Self-pay | Admitting: Family

## 2017-05-29 DIAGNOSIS — IMO0002 Reserved for concepts with insufficient information to code with codable children: Secondary | ICD-10-CM

## 2017-05-29 DIAGNOSIS — Z89411 Acquired absence of right great toe: Secondary | ICD-10-CM

## 2017-05-29 DIAGNOSIS — M960 Pseudarthrosis after fusion or arthrodesis: Secondary | ICD-10-CM

## 2017-05-29 DIAGNOSIS — S92901K Unspecified fracture of right foot, subsequent encounter for fracture with nonunion: Secondary | ICD-10-CM

## 2017-05-29 MED ORDER — OXYCODONE-ACETAMINOPHEN 10-325 MG PO TABS: 1.0000 | ORAL_TABLET | Freq: Two times a day (BID) | ORAL | 0 refills | Status: DC | PRN

## 2017-05-29 NOTE — Progress Notes (Signed)
Post-Op Visit Note   Patient: Susan Short           Date of Birth: 02-20-1971           MRN: [id number] Visit Date: 05/29/2017 PCP: Antonietta Jewel, MD  Chief Complaint:  Chief Complaint  Patient presents with  . Right Foot - Follow-up    04/26/17 R GT Amputation    HPI:  HPI The patient is a 46 year old woman who presents today status post right great toe amputation September 14. She continues to complain of tingling stabbing pain to her right foot second toe as well as the dorsum of the foot are especially painful. Poorly localized.  Has been on Lyrica for quite some time. Currently taking Percocet 10/325. States has been in pain management in past.   Ortho Exam On examination of right foot incision is well healed. No erythema. Mild edema medially. No dystrophic changes. Foot is normothermic. Tender diffusely over 2nd toe and dorsum of foot.   Visit Diagnoses:  1. Pseudarthrosis after fusion or arthrodesis   2. Nonunion of foot fracture, right   3. Great toe amputation status, right ([place])     Plan: may resume regular shoe wear and advance weight bearing as tolerated. Follow up in office as needed. Chronic right foot pain, Doubt CRPS. will provide one last refill of pain medication. Did provide referral to pain management.    Follow-Up Instructions: Return in about 4 weeks (around 06/26/2017).   Imaging: No results found.  Orders:  Orders Placed This Encounter  Procedures  . Ambulatory referral to Pain Clinic   Meds ordered this encounter  Medications  . oxyCODONE-acetaminophen (PERCOCET) 10-325 MG tablet    Sig: Take 1 tablet by mouth 2 (two) times daily as needed for pain.    Dispense:  30 tablet    Refill:  0     PMFS History: Patient Active Problem List   Diagnosis Date Noted  . Great toe amputation status, right ([place]) 05/15/2017  . 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 Retained orthopedic hardware   . Nonunion of foot fracture,  right 07/13/2016  . DECREASED HEARING 10/13/2010  . ALLERGIC RHINITIS 10/13/2010  . PULMONARY NODULE 10/13/2010  . NEOPLASM, BENIGN, THYROID 09/22/2010  . HYPERLIPIDEMIA 09/22/2010   Past Medical History:  Diagnosis Date  . Arthritis   . Asthma   . Constipation   . Deaf    left ear  . Difficult intubation    Patient reports that they had to use a small tube; MAC3, grade 1 view, 1 attempt, 7.0 ETT on 05/11/09  . Fibromyalgia   . GERD (gastroesophageal reflux disease)   . Hard of hearing    right  . Panic attack    Panic  . Seizures ([place])    "due to Tramadol"  . Shortness of breath dyspnea    with exertion    History reviewed. No pertinent family history.  Past Surgical History:  Procedure Laterality Date  . ABDOMINAL HYSTERECTOMY    . AMPUTATION Right 04/26/2017   Procedure: Right Great Toe Amputation;  Surgeon: Newt Minion, MD;  Location: [facility];  Service: Orthopedics;  Laterality: Right;  . APPENDECTOMY     as child  . ARTHRODESIS METATARSAL Right 08/01/2016   Procedure: Take Down Non Union, Remove Hardware, Revision Fusion Right Great Toe;  Surgeon: Newt Minion, MD;  Location: [facility];  Service: Orthopedics;  Laterality: Right;  . ARTHRODESIS METATARSALPHALANGEAL JOINT (MTPJ) Right 05/02/2016  Procedure: ARTHRODESIS METATARSALPHALANGEAL JOINT (MTPJ) RIGHT GREAT TOE;  Surgeon: Newt Minion, MD;  Location: [facility];  Service: Orthopedics;  Laterality: Right;  . CERVICAL FUSION    . CESAREAN SECTION     x 2  . ORIF CLAVICULAR FRACTURE Right 05/05/2013   Procedure: OPEN REDUCTION INTERNAL FIXATION (ORIF) CLAVICULAR FRACTURE- right;  Surgeon: Meredith Pel, MD;  Location: [facility];  Service: Orthopedics;  Laterality: Right;  Right Clavicle Fracture Non Union Takedown and Fixation with Bone Graft, Coracoclavicular Ligament Reconstruction with Tight Rope.  Marland Kitchen SHOULDER SURGERY Right    x 3- fracture- screws,   . thumb operation    . TONSILLECTOMY AND ADENOIDECTOMY     as a  child   Social History   Occupational History  . Not on file.   Social History Main Topics  . Smoking status: Current Every Day Smoker    Packs/day: 1.00    Years: 35.00  . Smokeless tobacco: Never Used  . Alcohol use No  . Drug use: No  . Sexual activity: Not on file

## 2017-06-10 ENCOUNTER — Other Ambulatory Visit (INDEPENDENT_AMBULATORY_CARE_PROVIDER_SITE_OTHER): Payer: Self-pay | Admitting: Orthopedic Surgery

## 2017-06-10 ENCOUNTER — Telehealth (INDEPENDENT_AMBULATORY_CARE_PROVIDER_SITE_OTHER): Payer: Self-pay | Admitting: Orthopedic Surgery

## 2017-06-10 MED ORDER — OXYCODONE-ACETAMINOPHEN 10-325 MG PO TABS: 1.0000 | ORAL_TABLET | Freq: Two times a day (BID) | ORAL | 0 refills | Status: DC | PRN

## 2017-06-10 NOTE — Telephone Encounter (Signed)
Litsy, E[place]ing  01/30/1971 [phone]   Med-refill  Oxycodone-acetaminophen(Percocet) 10-325 mg

## 2017-06-10 NOTE — Telephone Encounter (Signed)
rx written

## 2017-06-11 NOTE — Telephone Encounter (Signed)
I called and spoke with patients mother to advise rx for oxycodone at front desk.

## 2017-06-18 ENCOUNTER — Telehealth (INDEPENDENT_AMBULATORY_CARE_PROVIDER_SITE_OTHER): Payer: Self-pay | Admitting: Orthopedic Surgery

## 2017-06-18 NOTE — Telephone Encounter (Signed)
Lets see if we can get the lyrica authorized, would need to see her first to evaluate for oxycodone

## 2017-06-18 NOTE — Telephone Encounter (Signed)
Patient's mother called requesting an RX refill on the patient's Oxycodone and also she stated that the patient has Lyrica at the pharmacy, but the pharmacy won't fill it until it has been authorized.  CB#[phone].  Thank you.

## 2017-06-19 ENCOUNTER — Telehealth (INDEPENDENT_AMBULATORY_CARE_PROVIDER_SITE_OTHER): Payer: Self-pay | Admitting: Orthopedic Surgery

## 2017-06-19 ENCOUNTER — Telehealth (INDEPENDENT_AMBULATORY_CARE_PROVIDER_SITE_OTHER): Payer: Self-pay | Admitting: Family

## 2017-06-19 NOTE — Telephone Encounter (Signed)
Rejected cover my meds prior auth referred to [facility]. The pt does not meet qualifying criteria for non preferred medication. Must try and fail Neurontin or Cymbalta. I do not see these meds documented in the pts chart. If she tries and fails both of these we can attempt prior auth again

## 2017-06-19 NOTE — Telephone Encounter (Signed)
Call in neurontin 100 mg TID, 90

## 2017-06-19 NOTE — Telephone Encounter (Signed)
Patients mother requested a new referral be put in to [facility] in [place] [phone] per request. Susan Short put one in for [facility] in [place] but Susan Short is closer for patient. She is hoping they could prescribe her the oxycodone.   Number for call back is [phone]

## 2017-06-19 NOTE — Telephone Encounter (Signed)
Patient called asking for a prior authorization for her lyrica. CB # [phone]

## 2017-06-20 ENCOUNTER — Other Ambulatory Visit (INDEPENDENT_AMBULATORY_CARE_PROVIDER_SITE_OTHER): Payer: Self-pay

## 2017-06-20 DIAGNOSIS — G894 Chronic pain syndrome: Secondary | ICD-10-CM

## 2017-06-20 MED ORDER — GABAPENTIN 100 MG PO CAPS: 100.0000 mg | ORAL_CAPSULE | Freq: Three times a day (TID) | ORAL | 3 refills | Status: DC

## 2017-06-20 NOTE — Telephone Encounter (Signed)
Called and sw pt's mother. No refill on narcotic, insurance will not approve lyrica. rx for neurontin faxed to pharm. Referral for pain management in chart. Will take several weeks for review. Will call if accepted with appt. Voiced understanding and will call with questions,

## 2017-06-20 NOTE — Telephone Encounter (Signed)
Called and sw pt's mother to advise that rx has been faxed to the pharm.

## 2017-06-20 NOTE — Telephone Encounter (Signed)
Duplicate message. 

## 2017-07-29 ENCOUNTER — Ambulatory Visit (INDEPENDENT_AMBULATORY_CARE_PROVIDER_SITE_OTHER): Payer: Medicaid Other | Admitting: Orthopedic Surgery

## 2017-09-02 ENCOUNTER — Encounter (INDEPENDENT_AMBULATORY_CARE_PROVIDER_SITE_OTHER): Payer: Self-pay | Admitting: Family

## 2017-09-02 ENCOUNTER — Ambulatory Visit (INDEPENDENT_AMBULATORY_CARE_PROVIDER_SITE_OTHER): Payer: Medicaid Other | Admitting: Family

## 2017-09-02 VITALS — Ht 63.0 in | Wt 139.0 lb

## 2017-09-02 DIAGNOSIS — M792 Neuralgia and neuritis, unspecified: Secondary | ICD-10-CM | POA: Insufficient documentation

## 2017-09-02 DIAGNOSIS — Z89411 Acquired absence of right great toe: Secondary | ICD-10-CM

## 2017-09-02 DIAGNOSIS — IMO0002 Reserved for concepts with insufficient information to code with codable children: Secondary | ICD-10-CM

## 2017-09-02 DIAGNOSIS — G5791 Unspecified mononeuropathy of right lower limb: Secondary | ICD-10-CM

## 2017-09-02 MED ORDER — GABAPENTIN 100 MG PO CAPS: 100.0000 mg | ORAL_CAPSULE | Freq: Three times a day (TID) | ORAL | 3 refills | Status: DC

## 2017-09-02 NOTE — Addendum Note (Signed)
Addended by: Dondra Prader R on: 09/02/2017 03:09 PM   Modules accepted: Orders

## 2017-09-02 NOTE — Progress Notes (Signed)
Post-Op Visit Note   Patient: Susan Short           Date of Birth: 05-20-1971           MRN: [id number] Visit Date: 09/02/2017 PCP: Antonietta Jewel, MD  Chief Complaint:  Chief Complaint  Patient presents with  . Right Foot - Follow-up    Hx of great toe amputation 04/2017    HPI:  HPI The patient is a 47 year old woman who presents today complaining of right foot second toe pain. burning pain which is getting worse. States is worse with ambulation as well. States lyrica and percocet work well for her pain.  Has had difficulty getting her Lyrica prior auth'd. Patient is aware. We called in Neurontin for her on 06/18/17. Patient did not fill this prescription.   Is status post right great toe amputation September 14.   States has been in pain management in past. We have referred her to pain management as recently as last fall. Will follow up on this referral.  Today ultimately requesting pain medication. Does also ask if she were to get her second toe amputated "due to pain" if she could get a Percocet refill.  Ortho Exam On examination of right foot incision is well healed. No erythema. Mild edema medially. No dystrophic changes. Foot is normothermic. Tender diffusely over 2nd toe and dorsum of foot. Poorly localized.   Visit Diagnoses:  No diagnosis found.  Plan: Chronic right foot pain, doubt CRPS. Offered Neurontin and/or Cymbalta prescriptions for neuropathic pain. Discussed at length. Patient not interested. Declined to refill Percocet or Tramadol as well. Offered to provide referral to pain management. Patient states she would like to find a new provider.   Follow-Up Instructions: No Follow-up on file.   Imaging: No results found.  Orders:  No orders of the defined types were placed in this encounter.  No orders of the defined types were placed in this encounter.    PMFS History: Patient Active Problem List   Diagnosis Date Noted  . Great toe amputation  status, right ([place]) 05/15/2017  . 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 Retained orthopedic hardware   . Nonunion of foot fracture, right 07/13/2016  . DECREASED HEARING 10/13/2010  . ALLERGIC RHINITIS 10/13/2010  . PULMONARY NODULE 10/13/2010  . NEOPLASM, BENIGN, THYROID 09/22/2010  . HYPERLIPIDEMIA 09/22/2010   Past Medical History:  Diagnosis Date  . Arthritis   . Asthma   . Constipation   . Deaf    left ear  . Difficult intubation    Patient reports that they had to use a small tube; MAC3, grade 1 view, 1 attempt, 7.0 ETT on 05/11/09  . Fibromyalgia   . GERD (gastroesophageal reflux disease)   . Hard of hearing    right  . Panic attack    Panic  . Seizures ([place])    "due to Tramadol"  . Shortness of breath dyspnea    with exertion    No family history on file.  Past Surgical History:  Procedure Laterality Date  . ABDOMINAL HYSTERECTOMY    . AMPUTATION Right 04/26/2017   Procedure: Right Great Toe Amputation;  Surgeon: Newt Minion, MD;  Location: [facility];  Service: Orthopedics;  Laterality: Right;  . APPENDECTOMY     as child  . ARTHRODESIS METATARSAL Right 08/01/2016   Procedure: Take Down Non Union, Remove Hardware, Revision Fusion Right Great Toe;  Surgeon: Newt Minion, MD;  Location: [facility];  Service: Orthopedics;  Laterality: Right;  . ARTHRODESIS METATARSALPHALANGEAL JOINT (MTPJ) Right 05/02/2016   Procedure: ARTHRODESIS METATARSALPHALANGEAL JOINT (MTPJ) RIGHT GREAT TOE;  Surgeon: Newt Minion, MD;  Location: [facility];  Service: Orthopedics;  Laterality: Right;  . CERVICAL FUSION    . CESAREAN SECTION     x 2  . ORIF CLAVICULAR FRACTURE Right 05/05/2013   Procedure: OPEN REDUCTION INTERNAL FIXATION (ORIF) CLAVICULAR FRACTURE- right;  Surgeon: Meredith Pel, MD;  Location: [facility];  Service: Orthopedics;  Laterality: Right;  Right Clavicle Fracture Non Union Takedown and Fixation with Bone Graft, Coracoclavicular Ligament Reconstruction with  Tight Rope.  Marland Kitchen SHOULDER SURGERY Right    x 3- fracture- screws,   . thumb operation    . TONSILLECTOMY AND ADENOIDECTOMY     as a child   Social History   Occupational History  . Not on file  Tobacco Use  . Smoking status: Current Every Day Smoker    Packs/day: 1.00    Years: 35.00    Pack years: 35.00  . Smokeless tobacco: Never Used  Substance and Sexual Activity  . Alcohol use: No  . Drug use: No  . Sexual activity: Not on file

## 2017-11-14 ENCOUNTER — Ambulatory Visit (INDEPENDENT_AMBULATORY_CARE_PROVIDER_SITE_OTHER): Payer: Medicaid Other

## 2017-11-14 ENCOUNTER — Ambulatory Visit (INDEPENDENT_AMBULATORY_CARE_PROVIDER_SITE_OTHER): Payer: Medicaid Other | Admitting: Orthopedic Surgery

## 2017-11-14 VITALS — Ht 63.0 in | Wt 139.0 lb

## 2017-11-14 DIAGNOSIS — M79644 Pain in right finger(s): Secondary | ICD-10-CM

## 2017-11-14 DIAGNOSIS — M542 Cervicalgia: Secondary | ICD-10-CM

## 2017-11-14 DIAGNOSIS — M6701 Short Achilles tendon (acquired), right ankle: Secondary | ICD-10-CM | POA: Diagnosis not present

## 2017-11-14 MED ORDER — GABAPENTIN 300 MG PO CAPS: 300.0000 mg | ORAL_CAPSULE | Freq: Three times a day (TID) | ORAL | 3 refills | Status: DC
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
## 2018-01-13 ENCOUNTER — Encounter (INDEPENDENT_AMBULATORY_CARE_PROVIDER_SITE_OTHER): Payer: Self-pay | Admitting: Orthopedic Surgery

## 2018-01-13 ENCOUNTER — Ambulatory Visit (INDEPENDENT_AMBULATORY_CARE_PROVIDER_SITE_OTHER): Payer: Medicaid Other | Admitting: Orthopedic Surgery

## 2018-01-13 VITALS — Ht 63.0 in | Wt 139.0 lb

## 2018-01-13 DIAGNOSIS — M1A0710 Idiopathic chronic gout, right ankle and foot, without tophus (tophi): Secondary | ICD-10-CM

## 2018-01-13 MED ORDER — TRAMADOL HCL 50 MG PO TABS: 50.0000 mg | ORAL_TABLET | Freq: Four times a day (QID) | ORAL | 0 refills | Status: DC | PRN

## 2018-01-13 NOTE — Progress Notes (Signed)
Office Visit Note   Patient: Brandon Scarbrough           Date of Birth: 08-22-1970           MRN: [id number] Visit Date: 01/13/2018              Requested by: Antonietta Jewel, MD [address] [place], [place] [address] PCP: Antonietta Jewel, MD  Chief Complaint  Patient presents with  . Right Foot - Follow-up    04/26/17 right great toe amputation       HPI: Patient is a 47 year old woman status post great toe amputation who presents complaining of acute pain of the first metatarsal head right foot and PIP joint of the second toe right foot.  Patient denies any history of gout.  She also complains of a painful ulcer over the tip of the third toe right foot.  Assessment & Plan: Visit Diagnoses: No diagnosis found.  Plan: Ulcer debrided nail infection nail trim no signs of paronychial infection we will give her a prescription for Ultram for pain she states she can take the Ultram but not Toradol.  We will check a uric acid call and gout medication if this is elevated reevaluate in 2 weeks.  Follow-Up Instructions: No follow-ups on file.   Ortho Exam  Patient is alert, oriented, no adenopathy, well-dressed, normal affect, normal respiratory effort. Examination patient has palpable pulses she has some mild redness around the first metatarsal head right foot this is tender to palpation there is no ascending cellulitis no ulcers no drainage.  Examination the second toe she has some mild redness around the PIP joint of the second toe this is tender to palpation.  There is no ascending cellulitis no ulcers no drainage.  Semination of the third toe she has a Waggoner grade 1 ulcer over the tip of the third toe.  After informed consent a 10 blade knife was used to debride the skin and soft tissue back to healthy viable tissue.  There is no deep abscess no signs of infection.  The nail was also trim that had thickened discolored onychomycosis but there was no signs of a paronychial infection.   Patient's symptoms seem to be consistent with gout we will draw a uric acid level.  Imaging: No results found. No images are attached to the encounter.  Labs: No results found for: HGBA1C, ESRSEDRATE, CRP, LABURIC, REPTSTATUS, GRAMSTAIN, CULT, LABORGA   Lab Results  Component Value Date   ALBUMIN 4.7 01/03/2012    Body mass index is 24.62 kg/m.  Orders:  No orders of the defined types were placed in this encounter.  No orders of the defined types were placed in this encounter.    Procedures: No procedures performed  Clinical Data: No additional findings.  ROS:  All other systems negative, except as noted in the HPI. Review of Systems  Objective: Vital Signs: Ht 5\' 3"  (1.6 m)   Wt 139 lb (63 kg)   BMI 24.62 kg/m   Specialty Comments:  No specialty comments available.  PMFS History: Patient Active Problem List   Diagnosis Date Noted  . Neuropathic pain of foot, right 09/02/2017  . Great toe amputation status, right ([place]) 05/15/2017  . 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 Retained orthopedic hardware   . Nonunion of foot fracture, right 07/13/2016  . DECREASED HEARING 10/13/2010  . ALLERGIC RHINITIS 10/13/2010  . PULMONARY NODULE 10/13/2010  . NEOPLASM, BENIGN, THYROID 09/22/2010  . HYPERLIPIDEMIA 09/22/2010  Past Medical History:  Diagnosis Date  . Arthritis   . Asthma   . Constipation   . Deaf    left ear  . Difficult intubation    Patient reports that they had to use a small tube; MAC3, grade 1 view, 1 attempt, 7.0 ETT on 05/11/09  . Fibromyalgia   . GERD (gastroesophageal reflux disease)   . Hard of hearing    right  . Panic attack    Panic  . Seizures ([place])    "due to Tramadol"  . Shortness of breath dyspnea    with exertion    No family history on file.  Past Surgical History:  Procedure Laterality Date  . ABDOMINAL HYSTERECTOMY    . AMPUTATION Right 04/26/2017   Procedure: Right Great Toe Amputation;  Surgeon:  Newt Minion, MD;  Location: [facility];  Service: Orthopedics;  Laterality: Right;  . APPENDECTOMY     as child  . ARTHRODESIS METATARSAL Right 08/01/2016   Procedure: Take Down Non Union, Remove Hardware, Revision Fusion Right Great Toe;  Surgeon: Newt Minion, MD;  Location: [facility];  Service: Orthopedics;  Laterality: Right;  . ARTHRODESIS METATARSALPHALANGEAL JOINT (MTPJ) Right 05/02/2016   Procedure: ARTHRODESIS METATARSALPHALANGEAL JOINT (MTPJ) RIGHT GREAT TOE;  Surgeon: Newt Minion, MD;  Location: [facility];  Service: Orthopedics;  Laterality: Right;  . CERVICAL FUSION    . CESAREAN SECTION     x 2  . ORIF CLAVICULAR FRACTURE Right 05/05/2013   Procedure: OPEN REDUCTION INTERNAL FIXATION (ORIF) CLAVICULAR FRACTURE- right;  Surgeon: Meredith Pel, MD;  Location: [facility];  Service: Orthopedics;  Laterality: Right;  Right Clavicle Fracture Non Union Takedown and Fixation with Bone Graft, Coracoclavicular Ligament Reconstruction with Tight Rope.  Marland Kitchen SHOULDER SURGERY Right    x 3- fracture- screws,   . thumb operation    . TONSILLECTOMY AND ADENOIDECTOMY     as a child   Social History   Occupational History  . Not on file  Tobacco Use  . Smoking status: Current Every Day Smoker    Packs/day: 1.00    Years: 35.00    Pack years: 35.00  . Smokeless tobacco: Never Used  Substance and Sexual Activity  . Alcohol use: No  . Drug use: No  . Sexual activity: Not on file

## 2018-01-14 ENCOUNTER — Telehealth (INDEPENDENT_AMBULATORY_CARE_PROVIDER_SITE_OTHER): Payer: Self-pay

## 2018-01-14 LAB — URIC ACID: URIC ACID, SERUM: 5.5 mg/dL (ref 2.5–7.0)

## 2018-01-14 NOTE — Telephone Encounter (Signed)
Called to advise labs are normal. Pt's mother requested that we not give her any more pain medication. She would not go into details why but she did want this documented in her chart.

## 2018-01-16 ENCOUNTER — Telehealth (INDEPENDENT_AMBULATORY_CARE_PROVIDER_SITE_OTHER): Payer: Self-pay | Admitting: Orthopedic Surgery

## 2018-01-16 NOTE — Telephone Encounter (Signed)
Refer to pain clinic? 

## 2018-01-16 NOTE — Telephone Encounter (Signed)
Pt is several months out for a great toe amputation. Was in the office last week for pain. Tested for gout and labs were normal. Given an rx for tramadol and the pt states that its not working and she wants percocet. Please advise.

## 2018-01-16 NOTE — Telephone Encounter (Signed)
Patient called stating that the tramadol that was prescribed to her isn't working and she would like to have percocet. CB # [phone]

## 2018-01-17 ENCOUNTER — Telehealth (INDEPENDENT_AMBULATORY_CARE_PROVIDER_SITE_OTHER): Payer: Self-pay | Admitting: Orthopedic Surgery

## 2018-01-17 NOTE — Telephone Encounter (Signed)
I called to advise that she had been told previously that she could increase her Neurontin to 300 mg tid. This was how the rx was filled in April voiced understanding and will call with questions.

## 2018-01-17 NOTE — Telephone Encounter (Signed)
I spoke with pt's mother to advise of message below. She will discuss with her daughter and call back if they are interested in pursing pain management referral.

## 2018-01-17 NOTE — Telephone Encounter (Signed)
Patients mother called to see if [name] could increase her gabapentin. CB # [phone]

## 2018-01-27 ENCOUNTER — Ambulatory Visit (INDEPENDENT_AMBULATORY_CARE_PROVIDER_SITE_OTHER): Payer: Medicaid Other | Admitting: Orthopedic Surgery

## 2018-03-04 ENCOUNTER — Encounter (INDEPENDENT_AMBULATORY_CARE_PROVIDER_SITE_OTHER): Payer: Self-pay | Admitting: Orthopedic Surgery

## 2018-03-04 ENCOUNTER — Ambulatory Visit (INDEPENDENT_AMBULATORY_CARE_PROVIDER_SITE_OTHER): Payer: Self-pay

## 2018-03-04 ENCOUNTER — Ambulatory Visit (INDEPENDENT_AMBULATORY_CARE_PROVIDER_SITE_OTHER): Payer: Medicaid Other | Admitting: Orthopedic Surgery

## 2018-03-04 DIAGNOSIS — M25512 Pain in left shoulder: Secondary | ICD-10-CM | POA: Diagnosis not present

## 2018-03-04 DIAGNOSIS — M25571 Pain in right ankle and joints of right foot: Secondary | ICD-10-CM

## 2018-03-04 MED ORDER — PREGABALIN 75 MG PO CAPS: 75.0000 mg | ORAL_CAPSULE | Freq: Two times a day (BID) | ORAL | 3 refills | Status: DC

## 2018-03-04 NOTE — Progress Notes (Signed)
Office Visit Note   Patient: Susan Short           Date of Birth: 25-Feb-1971           MRN: [id number] Visit Date: 03/04/2018              Requested by: Antonietta Jewel, MD [address] [place], [place] [address] PCP: Antonietta Jewel, MD  Chief Complaint  Patient presents with  . Right Foot - Pain      HPI: Patient is a 47 year old woman who was seen with a interpreter.  Patient states she fell about 4 days ago and has had left shoulder pain since that time.  She points to the top of her clavicle.  She also complains of pain at the amputation site of the right great toe she complains of generalized pain throughout her body.  Patient requesting Ambien tramadol and Percocet as well as Lyrica.  She states that the Neurontin has not worked as well in the past and would like to try Lyrica.  Assessment & Plan: Visit Diagnoses:  1. Acute pain of left shoulder   2. Pain in right ankle and joints of right foot     Plan: A prescription was sent to her pharmacy for the Lyrica 75 mg twice a day.  Discussed the importance of not taking this medicine with medicines like Ambien tramadol or Percocet.  Follow-Up Instructions: Return if symptoms worsen or fail to improve.   Ortho Exam  Patient is alert, oriented, no adenopathy, well-dressed, normal affect, normal respiratory effort. Examination patient complains of generalized pain throughout her body.  There is no pain with passive range of motion of the left shoulder and she has full range of motion there is no pain with Neer or Hawkins impingement test.  Radiographs shows no clavicle fracture the [facility] joint is not tender to palpation.  Examination of her hand she does have some arthritic nodules over the DIP joints these are tender to palpation but there is no redness no cellulitis no joint dislocation or subluxation.  The extensor and flexor tendons are intact.  Examination of her foot there is no redness no cellulitis no open wounds or  ulcers there is no signs of infection her foot is plantigrade she does have some pain and swelling across the forefoot due to her heel cord contracture and she was given instruction and demonstrated heel cord stretching to unload pressure from the forefoot.  Also recommend a stiffer soled sneaker to unload pressure from the forefoot.  There are no blisters no signs of infection.  Imaging: Xr Foot Complete Right  Result Date: 03/04/2018 2 view radiographs of the right foot shows no acute fracture.  The first ray resection stable with no destructive changes.  Xr Shoulder Left  Result Date: 03/04/2018 2 view radiographs of the left shoulder shows congruent glenohumeral joint with no fracture.  No images are attached to the encounter.  Labs: Lab Results  Component Value Date   LABURIC 5.5 01/13/2018     Lab Results  Component Value Date   ALBUMIN 4.7 01/03/2012   LABURIC 5.5 01/13/2018    There is no height or weight on file to calculate BMI.  Orders:  Orders Placed This Encounter  Procedures  . XR Shoulder Left  . XR Foot Complete Right   Meds ordered this encounter  Medications  . pregabalin (LYRICA) 75 MG capsule    Sig: Take 1 capsule (75 mg total) by mouth 2 (two)  times daily.    Dispense:  60 capsule    Refill:  3     Procedures: No procedures performed  Clinical Data: No additional findings.  ROS:  All other systems negative, except as noted in the HPI. Review of Systems  Objective: Vital Signs: There were no vitals taken for this visit.  Specialty Comments:  No specialty comments available.  PMFS History: Patient Active Problem List   Diagnosis Date Noted  . Neuropathic pain of foot, right 09/02/2017  . Great toe amputation status, right ([place]) 05/15/2017  . 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 Retained orthopedic hardware   . Nonunion of foot fracture, right 07/13/2016  . DECREASED HEARING 10/13/2010  . ALLERGIC RHINITIS  10/13/2010  . PULMONARY NODULE 10/13/2010  . NEOPLASM, BENIGN, THYROID 09/22/2010  . HYPERLIPIDEMIA 09/22/2010   Past Medical History:  Diagnosis Date  . Arthritis   . Asthma   . Constipation   . Deaf    left ear  . Difficult intubation    Patient reports that they had to use a small tube; MAC3, grade 1 view, 1 attempt, 7.0 ETT on 05/11/09  . Fibromyalgia   . GERD (gastroesophageal reflux disease)   . Hard of hearing    right  . Panic attack    Panic  . Seizures ([place])    "due to Tramadol"  . Shortness of breath dyspnea    with exertion    History reviewed. No pertinent family history.  Past Surgical History:  Procedure Laterality Date  . ABDOMINAL HYSTERECTOMY    . AMPUTATION Right 04/26/2017   Procedure: Right Great Toe Amputation;  Surgeon: Newt Minion, MD;  Location: [place];  Service: Orthopedics;  Laterality: Right;  . APPENDECTOMY     as child  . ARTHRODESIS METATARSAL Right 08/01/2016   Procedure: Take Down Non Union, Remove Hardware, Revision Fusion Right Great Toe;  Surgeon: Newt Minion, MD;  Location: [facility];  Service: Orthopedics;  Laterality: Right;  . ARTHRODESIS METATARSALPHALANGEAL JOINT (MTPJ) Right 05/02/2016   Procedure: ARTHRODESIS METATARSALPHALANGEAL JOINT (MTPJ) RIGHT GREAT TOE;  Surgeon: Newt Minion, MD;  Location: [facility];  Service: Orthopedics;  Laterality: Right;  . CERVICAL FUSION    . CESAREAN SECTION     x 2  . ORIF CLAVICULAR FRACTURE Right 05/05/2013   Procedure: OPEN REDUCTION INTERNAL FIXATION (ORIF) CLAVICULAR FRACTURE- right;  Surgeon: Meredith Pel, MD;  Location: [facility];  Service: Orthopedics;  Laterality: Right;  Right Clavicle Fracture Non Union Takedown and Fixation with Bone Graft, Coracoclavicular Ligament Reconstruction with Tight Rope.  Marland Kitchen SHOULDER SURGERY Right    x 3- fracture- screws,   . thumb operation    . TONSILLECTOMY AND ADENOIDECTOMY     as a child   Social History   Occupational History  . Not on file  Tobacco  Use  . Smoking status: Current Every Day Smoker    Packs/day: 1.00    Years: 35.00    Pack years: 35.00  . Smokeless tobacco: Never Used  Substance and Sexual Activity  . Alcohol use: No  . Drug use: No  . Sexual activity: Not on file

## 2018-04-08 ENCOUNTER — Ambulatory Visit (INDEPENDENT_AMBULATORY_CARE_PROVIDER_SITE_OTHER): Payer: Medicaid Other | Admitting: Orthopaedic Surgery

## 2018-04-18 ENCOUNTER — Other Ambulatory Visit (INDEPENDENT_AMBULATORY_CARE_PROVIDER_SITE_OTHER): Payer: Self-pay | Admitting: Orthopedic Surgery

## 2018-04-29 ENCOUNTER — Encounter (INDEPENDENT_AMBULATORY_CARE_PROVIDER_SITE_OTHER): Payer: Self-pay | Admitting: Orthopaedic Surgery

## 2018-04-29 ENCOUNTER — Ambulatory Visit (INDEPENDENT_AMBULATORY_CARE_PROVIDER_SITE_OTHER): Payer: Medicaid Other | Admitting: Orthopaedic Surgery

## 2018-04-29 VITALS — BP 113/63 | HR 66 | Ht 63.0 in

## 2018-04-29 DIAGNOSIS — S40011D Contusion of right shoulder, subsequent encounter: Secondary | ICD-10-CM | POA: Diagnosis not present

## 2018-04-29 DIAGNOSIS — Z981 Arthrodesis status: Secondary | ICD-10-CM | POA: Diagnosis not present

## 2018-04-29 MED ORDER — HYDROCODONE-ACETAMINOPHEN 5-325 MG PO TABS: 1.0000 | ORAL_TABLET | Freq: Three times a day (TID) | ORAL | 0 refills | Status: DC | PRN
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
## 2018-05-01 ENCOUNTER — Telehealth (INDEPENDENT_AMBULATORY_CARE_PROVIDER_SITE_OTHER): Payer: Self-pay | Admitting: Orthopaedic Surgery

## 2018-05-01 NOTE — Telephone Encounter (Signed)
Please advise 

## 2018-05-01 NOTE — Telephone Encounter (Signed)
Patient's daughter [name]) called advised the medication for pain (Vicodin) is not working to control the pain. [name] asked if there is something stronger for patient to take. The number to contact [name] is [phone]
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
## 2018-05-02 NOTE — Telephone Encounter (Signed)
noted 

## 2018-05-27 ENCOUNTER — Ambulatory Visit (INDEPENDENT_AMBULATORY_CARE_PROVIDER_SITE_OTHER): Payer: Medicaid Other | Admitting: Orthopaedic Surgery

## 2018-06-17 ENCOUNTER — Ambulatory Visit (INDEPENDENT_AMBULATORY_CARE_PROVIDER_SITE_OTHER): Payer: Medicaid Other | Admitting: Orthopaedic Surgery

## 2018-07-01 ENCOUNTER — Ambulatory Visit (INDEPENDENT_AMBULATORY_CARE_PROVIDER_SITE_OTHER): Payer: Medicaid Other | Admitting: Orthopaedic Surgery

## 2018-07-04 ENCOUNTER — Other Ambulatory Visit (INDEPENDENT_AMBULATORY_CARE_PROVIDER_SITE_OTHER): Payer: Self-pay | Admitting: Orthopedic Surgery

## 2018-07-07 NOTE — Telephone Encounter (Signed)
Do you want to refill? 

## 2018-08-17 ENCOUNTER — Other Ambulatory Visit (INDEPENDENT_AMBULATORY_CARE_PROVIDER_SITE_OTHER): Payer: Self-pay | Admitting: Orthopedic Surgery

## 2018-08-18 NOTE — Telephone Encounter (Signed)
Do you want to refill. Last office visit 02/2018

## 2018-09-02 ENCOUNTER — Ambulatory Visit (INDEPENDENT_AMBULATORY_CARE_PROVIDER_SITE_OTHER): Payer: Medicaid Other | Admitting: Orthopaedic Surgery

## 2018-11-01 ENCOUNTER — Other Ambulatory Visit (INDEPENDENT_AMBULATORY_CARE_PROVIDER_SITE_OTHER): Payer: Self-pay | Admitting: Orthopedic Surgery

## 2018-11-03 ENCOUNTER — Other Ambulatory Visit (INDEPENDENT_AMBULATORY_CARE_PROVIDER_SITE_OTHER): Payer: Self-pay | Admitting: Orthopedic Surgery

## 2018-11-03 ENCOUNTER — Telehealth (INDEPENDENT_AMBULATORY_CARE_PROVIDER_SITE_OTHER): Payer: Self-pay | Admitting: Orthopaedic Surgery

## 2018-11-03 NOTE — Telephone Encounter (Signed)
Please advise 

## 2018-11-03 NOTE — Telephone Encounter (Signed)
Pt called in said she needs a refill of her medication Lyrica 300MG  and please have that sent to [facility] [place] on [address].

## 2018-11-04 NOTE — Telephone Encounter (Signed)
Noted  

## 2018-11-04 NOTE — Telephone Encounter (Signed)
lyrica denied. I called her Mother. [name] had sent in both lyrica and neurontin. Has neurontin with multiple refills. Should not be on both. FYI

## 2018-12-16 ENCOUNTER — Other Ambulatory Visit (INDEPENDENT_AMBULATORY_CARE_PROVIDER_SITE_OTHER): Payer: Self-pay | Admitting: Orthopedic Surgery

## 2018-12-17 ENCOUNTER — Telehealth: Payer: Self-pay

## 2018-12-17 ENCOUNTER — Other Ambulatory Visit (INDEPENDENT_AMBULATORY_CARE_PROVIDER_SITE_OTHER): Payer: Self-pay

## 2018-12-17 ENCOUNTER — Other Ambulatory Visit (INDEPENDENT_AMBULATORY_CARE_PROVIDER_SITE_OTHER): Payer: Self-pay | Admitting: Orthopedic Surgery

## 2018-12-17 MED ORDER — GABAPENTIN 100 MG PO CAPS: 100.0000 mg | ORAL_CAPSULE | Freq: Three times a day (TID) | ORAL | 3 refills | Status: DC

## 2018-12-17 NOTE — Telephone Encounter (Signed)
Patient would like a Rx refill for Gabapentin [facility] [place].  Cb# is [phone].  Please advise.  Thank you.

## 2018-12-17 NOTE — Telephone Encounter (Signed)
Can you please advise? It looks like patient was getting gabapentin filled through you and [name]. Thanks.

## 2019-01-16 ENCOUNTER — Other Ambulatory Visit (INDEPENDENT_AMBULATORY_CARE_PROVIDER_SITE_OTHER): Payer: Self-pay | Admitting: Orthopedic Surgery

## 2019-01-16 NOTE — Telephone Encounter (Signed)
Dr. [name] last saw this pt does he want to refill?

## 2019-01-16 NOTE — Telephone Encounter (Signed)
Please see below and advise.

## 2019-01-16 NOTE — Telephone Encounter (Signed)
Dr. [name] denies refill. Hx of anxiety, multiple meds, has a psychiatrist, polypharmacy in past. She is not being actively followed by [place]. Thanks . She can discuss with PCP or her therapist. thanks

## 2019-02-09 ENCOUNTER — Ambulatory Visit (INDEPENDENT_AMBULATORY_CARE_PROVIDER_SITE_OTHER): Payer: Medicaid Other

## 2019-02-09 ENCOUNTER — Ambulatory Visit (INDEPENDENT_AMBULATORY_CARE_PROVIDER_SITE_OTHER): Payer: Medicaid Other | Admitting: Physician Assistant

## 2019-02-09 ENCOUNTER — Other Ambulatory Visit: Payer: Self-pay

## 2019-02-09 ENCOUNTER — Encounter: Payer: Self-pay | Admitting: Orthopedic Surgery

## 2019-02-09 VITALS — Ht 63.0 in | Wt 139.0 lb

## 2019-02-09 DIAGNOSIS — Z89411 Acquired absence of right great toe: Secondary | ICD-10-CM

## 2019-02-09 DIAGNOSIS — M79671 Pain in right foot: Secondary | ICD-10-CM | POA: Diagnosis not present

## 2019-02-09 MED ORDER — PREGABALIN 75 MG PO CAPS: 75.0000 mg | ORAL_CAPSULE | Freq: Two times a day (BID) | ORAL | 3 refills | Status: DC

## 2019-02-09 NOTE — Progress Notes (Signed)
Office Visit Note   Patient: Susan Short           Date of Birth: 08-31-1970           MRN: [id number] Visit Date: 02/09/2019              Requested by: Antonietta Jewel, MD [address] [place],  [place] [address] PCP: Antonietta Jewel, MD  Chief Complaint  Patient presents with  . Right Foot - Pain      HPI: The patient is a 48 year old woman who previously underwent a right great toe amputation in September 2008.  She reports that over the past 4 to 5 months she is noticed increased pain and swelling over the residual first metatarsal head.  She reports intermittent redness of the area.  She reports that the pain is throbbing and is hurt so bad that she has begun walking on the side of her foot as she cannot put pressure over the great toe area.  She has also complaining of pain over the second toe.  She reports that Tylenol is not helping for the pain.  She is on gabapentin 100 mg 3 times daily but reports that she is previously been on Lyrica and that this helped more and she would like to go on this.  She was also requesting some tramadol and we discussed that this could be used postoperatively. The patient was seen with the assistance of an interpreter to perform sign language for the visit. Assessment & Plan: Visit Diagnoses:  1. 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 Pain in right foot     Plan: Counseled the patient that there is evidence of osteomyelitis over the residual first metatarsal head and [name] recommended excision of the first metatarsal head and the patient was agreeable to proceed with surgery at this time..  We have also recommended stiff soled shoes postoperatively to keep pressure off the front of her foot.  Will resume Lyrica 75 mg twice daily at this point but will hold off on oral pain medication until postop.  Follow-Up Instructions: Return in about 1 week (around 02/16/2019).   Ortho Exam  Patient is alert, oriented, no adenopathy,  well-dressed, normal affect, normal respiratory effort. Examination of the right foot shows good palpable pedal pulses.  She has a well-healed great toe amputation site but pain over the first metatarsal head to palpation, mild edema and erythema over the area.  She is also tender over the third and fourth metatarsal heads and the second toe but there is no evidence of a sausage toe or other evidence of infection here.  She does have some mild callus over the fifth metatarsal head without ulceration.  She has excellent ankle range of motion and does not have any evidence of Achilles contracture.  Imaging: Xr Foot Complete Right  Result Date: 02/09/2019 Radiographs of the right foot show signs of osteomyelitis of the residual 1st metatarsal head, no fractures  No images are attached to the encounter.  Labs: Lab Results  Component Value Date   LABURIC 5.5 01/13/2018     Lab Results  Component Value Date   ALBUMIN 4.7 01/03/2012   LABURIC 5.5 01/13/2018    Body mass index is 24.62 kg/m.  Orders:  Orders Placed This Encounter  Procedures  . XR Foot Complete Right   Meds ordered this encounter  Medications  . pregabalin (LYRICA) 75 MG capsule    Sig: Take 1 capsule (75  mg total) by mouth 2 (two) times daily.    Dispense:  60 capsule    Refill:  3     Procedures: No procedures performed  Clinical Data: No additional findings.  ROS:  All other systems negative, except as noted in the HPI. Review of Systems  Objective: Vital Signs: Ht 5\' 3"  (1.6 m)   Wt 139 lb (63 kg)   BMI 24.62 kg/m   Specialty Comments:  No specialty comments available.  PMFS History: Patient Active Problem List   Diagnosis Date Noted  . Neuropathic pain of foot, right 09/02/2017  . Great toe amputation status, right 05/15/2017  . 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 Retained orthopedic hardware   . Nonunion of foot fracture, right 07/13/2016  . DECREASED HEARING  10/13/2010  . ALLERGIC RHINITIS 10/13/2010  . PULMONARY NODULE 10/13/2010  . NEOPLASM, BENIGN, THYROID 09/22/2010  . HYPERLIPIDEMIA 09/22/2010   Past Medical History:  Diagnosis Date  . Arthritis   . Asthma   . Constipation   . Deaf    left ear  . Difficult intubation    Patient reports that they had to use a small tube; MAC3, grade 1 view, 1 attempt, 7.0 ETT on 05/11/09  . Fibromyalgia   . GERD (gastroesophageal reflux disease)   . Hard of hearing    right  . Panic attack    Panic  . Seizures ([place])    "due to Tramadol"  . Shortness of breath dyspnea    with exertion    History reviewed. No pertinent family history.  Past Surgical History:  Procedure Laterality Date  . ABDOMINAL HYSTERECTOMY    . AMPUTATION Right 04/26/2017   Procedure: Right Great Toe Amputation;  Surgeon: Newt Minion, MD;  Location: [facility];  Service: Orthopedics;  Laterality: Right;  . APPENDECTOMY     as child  . ARTHRODESIS METATARSAL Right 08/01/2016   Procedure: Take Down Non Union, Remove Hardware, Revision Fusion Right Great Toe;  Surgeon: Newt Minion, MD;  Location: [facility];  Service: Orthopedics;  Laterality: Right;  . ARTHRODESIS METATARSALPHALANGEAL JOINT (MTPJ) Right 05/02/2016   Procedure: ARTHRODESIS METATARSALPHALANGEAL JOINT (MTPJ) RIGHT GREAT TOE;  Surgeon: Newt Minion, MD;  Location: [facility];  Service: Orthopedics;  Laterality: Right;  . CERVICAL FUSION    . CESAREAN SECTION     x 2  . ORIF CLAVICULAR FRACTURE Right 05/05/2013   Procedure: OPEN REDUCTION INTERNAL FIXATION (ORIF) CLAVICULAR FRACTURE- right;  Surgeon: Meredith Pel, MD;  Location: [facility];  Service: Orthopedics;  Laterality: Right;  Right Clavicle Fracture Non Union Takedown and Fixation with Bone Graft, Coracoclavicular Ligament Reconstruction with Tight Rope.  Marland Kitchen SHOULDER SURGERY Right    x 3- fracture- screws,   . thumb operation    . TONSILLECTOMY AND ADENOIDECTOMY     as a child   Social History   Occupational  History  . Not on file  Tobacco Use  . Smoking status: Current Every Day Smoker    Packs/day: 1.00    Years: 35.00    Pack years: 35.00  . Smokeless tobacco: Never Used  Substance and Sexual Activity  . Alcohol use: No  . Drug use: No  . Sexual activity: Not on file

## 2019-02-10 ENCOUNTER — Other Ambulatory Visit: Payer: Self-pay | Admitting: Family

## 2019-02-13 ENCOUNTER — Other Ambulatory Visit (HOSPITAL_COMMUNITY): Admission: RE | Admit: 2019-02-13 | Payer: Medicaid Other | Source: Ambulatory Visit

## 2019-02-17 ENCOUNTER — Encounter (HOSPITAL_COMMUNITY): Payer: Self-pay | Admitting: *Deleted

## 2019-02-17 ENCOUNTER — Other Ambulatory Visit: Payer: Self-pay

## 2019-02-17 ENCOUNTER — Other Ambulatory Visit (HOSPITAL_COMMUNITY)
Admission: RE | Admit: 2019-02-17 | Discharge: 2019-02-17 | Disposition: A | Discharge status: Home / Self Care | Payer: Medicaid Other | Source: Ambulatory Visit | Attending: Orthopedic Surgery | Admitting: Orthopedic Surgery

## 2019-02-17 DIAGNOSIS — Z1159 Encounter for screening for other viral diseases: Secondary | ICD-10-CM | POA: Insufficient documentation

## 2019-02-17 DIAGNOSIS — Z01812 Encounter for preprocedural laboratory examination: Secondary | ICD-10-CM | POA: Diagnosis not present

## 2019-02-17 LAB — SARS CORONAVIRUS 2 (TAT 6-24 HRS): SARS Coronavirus 2: NEGATIVE

## 2019-02-17 MED ORDER — CEFAZOLIN SODIUM-DEXTROSE 2-4 GM/100ML-% IV SOLN: 2.0000 g | INTRAVENOUS | Status: DC

## 2019-02-17 MED ORDER — CHLORHEXIDINE GLUCONATE 4 % EX LIQD: 60.0000 mL | Freq: Once | CUTANEOUS | Status: DC

## 2019-02-17 NOTE — Progress Notes (Signed)
I spoke to Ms Susan Major, Ms Susan Short mother- she said that Susan Short was at home and someone was with her that could help Susan Short understand me.  I called Susan Short and she answered and asked me to speak to her friend and she will help with  questions and instructions.  Susan Short has no chest pain or shortness of breath and no signs/sym,ptoms of covid. Patient is going to Susan Short for covid test today at 1300. I gave instructions that everyone in the car should wear a mask and no one except the people that leave with her should be in the house and everyone should wear a mask for the trip to the hospital in am. I instructed patient to not eat tonight after midnight I told patient she could drink clear liquids until 0530.  We reviewed what clear liquids consist of; patient drinks mountain dew and water, I said that would be fine.  I asked if after she has covid test today if they could come by Susan Short and pt up an Ensure Pre- Surgery drink and that she should  Drink it between 5:00 and 5:30, Susan Short agreed to this. I had patient repeat instructions to me, she did so correctly after some coaching, last time no coaching

## 2019-02-18 ENCOUNTER — Ambulatory Visit (HOSPITAL_COMMUNITY)
Admission: RE | Admit: 2019-02-18 | Discharge: 2019-02-18 | Disposition: A | Discharge status: Home / Self Care | Payer: Medicaid Other | Attending: Orthopedic Surgery | Admitting: Orthopedic Surgery

## 2019-02-18 ENCOUNTER — Ambulatory Visit (HOSPITAL_COMMUNITY): Payer: Medicaid Other | Admitting: Certified Registered"

## 2019-02-18 ENCOUNTER — Encounter (HOSPITAL_COMMUNITY)
Admission: RE | Disposition: A | Discharge status: Home / Self Care | Payer: Self-pay | Source: Home / Self Care | Attending: Orthopedic Surgery

## 2019-02-18 ENCOUNTER — Encounter (HOSPITAL_COMMUNITY): Payer: Self-pay | Admitting: Certified Registered"

## 2019-02-18 DIAGNOSIS — M86271 Subacute osteomyelitis, right ankle and foot: Secondary | ICD-10-CM | POA: Diagnosis not present

## 2019-02-18 DIAGNOSIS — Z89411 Acquired absence of right great toe: Secondary | ICD-10-CM | POA: Diagnosis not present

## 2019-02-18 DIAGNOSIS — F17210 Nicotine dependence, cigarettes, uncomplicated: Secondary | ICD-10-CM | POA: Diagnosis not present

## 2019-02-18 DIAGNOSIS — M869 Osteomyelitis, unspecified: Secondary | ICD-10-CM | POA: Insufficient documentation

## 2019-02-18 DIAGNOSIS — Z886 Allergy status to analgesic agent status: Secondary | ICD-10-CM | POA: Insufficient documentation

## 2019-02-18 DIAGNOSIS — Z888 Allergy status to other drugs, medicaments and biological substances status: Secondary | ICD-10-CM | POA: Insufficient documentation

## 2019-02-18 DIAGNOSIS — M1990 Unspecified osteoarthritis, unspecified site: Secondary | ICD-10-CM | POA: Diagnosis not present

## 2019-02-18 DIAGNOSIS — Z79899 Other long term (current) drug therapy: Secondary | ICD-10-CM | POA: Insufficient documentation

## 2019-02-18 DIAGNOSIS — Z885 Allergy status to narcotic agent status: Secondary | ICD-10-CM | POA: Diagnosis not present

## 2019-02-18 DIAGNOSIS — L97519 Non-pressure chronic ulcer of other part of right foot with unspecified severity: Secondary | ICD-10-CM | POA: Diagnosis not present

## 2019-02-18 DIAGNOSIS — H9192 Unspecified hearing loss, left ear: Secondary | ICD-10-CM | POA: Diagnosis not present

## 2019-02-18 DIAGNOSIS — J45909 Unspecified asthma, uncomplicated: Secondary | ICD-10-CM | POA: Insufficient documentation

## 2019-02-18 HISTORY — PX: AMPUTATION: SHX166

## 2019-02-18 HISTORY — DX: Pneumonia, unspecified organism: J18.9

## 2019-02-18 LAB — BASIC METABOLIC PANEL
Anion gap: 12 (ref 5–15)
BUN: 8 mg/dL (ref 6–20)
CO2: 23 mmol/L (ref 22–32)
Calcium: 9.1 mg/dL (ref 8.9–10.3)
Chloride: 104 mmol/L (ref 98–111)
Creatinine, Ser: 0.84 mg/dL (ref 0.44–1.00)
GFR calc Af Amer: >60 mL/min (ref >60)
GFR calc non Af Amer: >60 mL/min (ref >60)
Glucose, Bld: 101 mg/dL — ABNORMAL HIGH (ref 70–99)
Potassium: 3.2 mmol/L — ABNORMAL LOW (ref 3.5–5.1)
Sodium: 139 mmol/L (ref 135–145)

## 2019-02-18 LAB — CBC
HCT: 41.6 % (ref 36.0–46.0)
Hemoglobin: 14 g/dL (ref 12.0–15.0)
MCH: 30.8 pg (ref 26.0–34.0)
MCHC: 33.7 g/dL (ref 30.0–36.0)
MCV: 91.4 fL (ref 80.0–100.0)
Platelets: 263 10*3/uL (ref 150–400)
RBC: 4.55 MIL/uL (ref 3.87–5.11)
RDW: 12.3 % (ref 11.5–15.5)
WBC: 7.1 10*3/uL (ref 4.0–10.5)
nRBC: 0 % (ref 0.0–0.2)

## 2019-02-18 LAB — SURGICAL PCR SCREEN
MRSA, PCR: POSITIVE — AB
Staphylococcus aureus: POSITIVE — AB

## 2019-02-18 SURGERY — AMPUTATION, FOOT, RAY
Anesthesia: Regional | Site: Foot | Laterality: Right

## 2019-02-18 MED ORDER — OXYCODONE-ACETAMINOPHEN 10-325 MG PO TABS: 1.0000 | ORAL_TABLET | ORAL | 0 refills | Status: DC | PRN

## 2019-02-18 MED ORDER — MUPIROCIN 2 % EX OINT
TOPICAL_OINTMENT | Freq: Two times a day (BID) | CUTANEOUS | Status: DC
  Administered 2019-02-18: 10:00:00 via NASAL
  Filled 2019-02-18: qty 22

## 2019-02-18 MED ORDER — ENSURE PRE-SURGERY PO LIQD: 296.0000 mL | Freq: Once | ORAL | Status: DC

## 2019-02-18 MED ORDER — ROPIVACAINE HCL 5 MG/ML IJ SOLN
INTRAMUSCULAR | Status: DC | PRN
  Administered 2019-02-18: 30 mL via EPIDURAL
  Administered 2019-02-18: 10 mL via EPIDURAL

## 2019-02-18 MED ORDER — VANCOMYCIN HCL IN DEXTROSE 1-5 GM/200ML-% IV SOLN
1000.0000 mg | Freq: Once | INTRAVENOUS | Status: AC
  Administered 2019-02-18: 07:00:00 1000 mg via INTRAVENOUS
  Filled 2019-02-18: qty 200

## 2019-02-18 MED ORDER — ONDANSETRON HCL 4 MG/2ML IJ SOLN: 4.0000 mg | Freq: Once | INTRAMUSCULAR | Status: DC | PRN

## 2019-02-18 MED ORDER — CHLORHEXIDINE GLUCONATE 4 % EX LIQD: 60.0000 mL | Freq: Once | CUTANEOUS | Status: DC

## 2019-02-18 MED ORDER — FENTANYL CITRATE (PF) 100 MCG/2ML IJ SOLN
INTRAMUSCULAR | Status: AC
  Administered 2019-02-18: 09:00:00 25 ug via INTRAVENOUS
  Filled 2019-02-18: qty 2

## 2019-02-18 MED ORDER — LIDOCAINE 2% (20 MG/ML) 5 ML SYRINGE
INTRAMUSCULAR | Status: AC
  Filled 2019-02-18: qty 5

## 2019-02-18 MED ORDER — POVIDONE-IODINE 10 % EX SWAB
2.0000 "application " | Freq: Once | CUTANEOUS | Status: AC
  Administered 2019-02-18: 2 via TOPICAL

## 2019-02-18 MED ORDER — 0.9 % SODIUM CHLORIDE (POUR BTL) OPTIME
TOPICAL | Status: DC | PRN
  Administered 2019-02-18: 09:00:00 1000 mL

## 2019-02-18 MED ORDER — FENTANYL CITRATE (PF) 100 MCG/2ML IJ SOLN
25.0000 ug | INTRAMUSCULAR | Status: DC | PRN
  Administered 2019-02-18 (×4): 25 ug via INTRAVENOUS

## 2019-02-18 MED ORDER — LACTATED RINGERS IV SOLN
INTRAVENOUS | Status: DC
  Administered 2019-02-18: 07:00:00 via INTRAVENOUS

## 2019-02-18 MED ORDER — PROPOFOL 500 MG/50ML IV EMUL
INTRAVENOUS | Status: DC | PRN
  Administered 2019-02-18: 100 ug/kg/min via INTRAVENOUS

## 2019-02-18 MED ORDER — CEFAZOLIN SODIUM-DEXTROSE 2-4 GM/100ML-% IV SOLN
2.0000 g | INTRAVENOUS | Status: AC
  Administered 2019-02-18: 09:00:00 2 g via INTRAVENOUS
  Filled 2019-02-18: qty 100

## 2019-02-18 MED ORDER — FENTANYL CITRATE (PF) 250 MCG/5ML IJ SOLN
INTRAMUSCULAR | Status: DC | PRN
  Administered 2019-02-18 (×2): 50 ug via INTRAVENOUS

## 2019-02-18 MED ORDER — MIDAZOLAM HCL 2 MG/2ML IJ SOLN
INTRAMUSCULAR | Status: AC
  Filled 2019-02-18: qty 2

## 2019-02-18 MED ORDER — MIDAZOLAM HCL 2 MG/2ML IJ SOLN
INTRAMUSCULAR | Status: DC | PRN
  Administered 2019-02-18: 2 mg via INTRAVENOUS

## 2019-02-18 MED ORDER — MUPIROCIN 2 % EX OINT
TOPICAL_OINTMENT | Freq: Two times a day (BID) | CUTANEOUS | Status: DC
  Filled 2019-02-18: qty 22

## 2019-02-18 MED ORDER — FENTANYL CITRATE (PF) 250 MCG/5ML IJ SOLN
INTRAMUSCULAR | Status: AC
  Filled 2019-02-18: qty 5

## 2019-02-18 MED ORDER — PROPOFOL 1000 MG/100ML IV EMUL
INTRAVENOUS | Status: AC
  Filled 2019-02-18: qty 100

## 2019-02-18 MED ORDER — PROPOFOL 10 MG/ML IV BOLUS
INTRAVENOUS | Status: AC
  Filled 2019-02-18: qty 20

## 2019-02-18 SURGICAL SUPPLY — 29 items
BLADE SAW SGTL MED 73X18.5 STR (BLADE) IMPLANT
BLADE SURG 21 STRL SS (BLADE) ×3 IMPLANT
BNDG COHESIVE 4X5 TAN STRL (GAUZE/BANDAGES/DRESSINGS) ×3 IMPLANT
BNDG GAUZE ELAST 4 BULKY (GAUZE/BANDAGES/DRESSINGS) ×3 IMPLANT
COVER SURGICAL LIGHT HANDLE (MISCELLANEOUS) ×6 IMPLANT
COVER WAND RF STERILE (DRAPES) ×3 IMPLANT
DRAPE U-SHAPE 47X51 STRL (DRAPES) ×6 IMPLANT
DRSG ADAPTIC 3X8 NADH LF (GAUZE/BANDAGES/DRESSINGS) ×3 IMPLANT
DRSG PAD ABDOMINAL 8X10 ST (GAUZE/BANDAGES/DRESSINGS) ×4 IMPLANT
DURAPREP 26ML APPLICATOR (WOUND CARE) ×3 IMPLANT
ELECT REM PT RETURN 9FT ADLT (ELECTROSURGICAL) ×3
ELECTRODE REM PT RTRN 9FT ADLT (ELECTROSURGICAL) ×1 IMPLANT
GAUZE SPONGE 4X4 12PLY STRL (GAUZE/BANDAGES/DRESSINGS) ×3 IMPLANT
GLOVE BIOGEL PI IND STRL 9 (GLOVE) ×1 IMPLANT
GLOVE BIOGEL PI INDICATOR 9 (GLOVE) ×2
GLOVE SURG ORTHO 9.0 STRL STRW (GLOVE) ×3 IMPLANT
GOWN STRL REUS W/ TWL XL LVL3 (GOWN DISPOSABLE) ×2 IMPLANT
GOWN STRL REUS W/TWL XL LVL3 (GOWN DISPOSABLE) ×6
KIT BASIN OR (CUSTOM PROCEDURE TRAY) ×3 IMPLANT
KIT TURNOVER KIT B (KITS) ×3 IMPLANT
NS IRRIG 1000ML POUR BTL (IV SOLUTION) ×3 IMPLANT
PACK ORTHO EXTREMITY (CUSTOM PROCEDURE TRAY) ×3 IMPLANT
PAD ARMBOARD 7.5X6 YLW CONV (MISCELLANEOUS) ×6 IMPLANT
STOCKINETTE IMPERVIOUS LG (DRAPES) IMPLANT
SUT ETHILON 2 0 PSLX (SUTURE) ×3 IMPLANT
TOWEL GREEN STERILE (TOWEL DISPOSABLE) ×3 IMPLANT
TUBE CONNECTING 12'X1/4 (SUCTIONS) ×1
TUBE CONNECTING 12X1/4 (SUCTIONS) ×2 IMPLANT
YANKAUER SUCT BULB TIP NO VENT (SUCTIONS) ×3 IMPLANT

## 2019-02-18 NOTE — Anesthesia Procedure Notes (Signed)
Anesthesia Regional Block: Popliteal block   Pre-Anesthetic Checklist: ,, timeout performed, Correct Patient, Correct Site, Correct Laterality, Correct Procedure, Correct Position, site marked, Risks and benefits discussed,  Surgical consent,  Pre-op evaluation,  At surgeon's request and post-op pain management  Laterality: Right  Prep: chloraprep       Needles:  Injection technique: Single-shot  Needle Type: Echogenic Needle     Needle Length: 9cm  Needle Gauge: 21     Additional Needles:   Procedures:,,,, ultrasound used (permanent image in chart),,,,  Narrative:  Start time: 02/18/2019 7:55 AM End time: 02/18/2019 8:05 AM Injection made incrementally with aspirations every 5 mL.  Performed by: Personally  Anesthesiologist: Catalina Gravel, MD  Additional Notes: No pain on injection. No increased resistance to injection. Injection made in 5cc increments.  Good needle visualization.  Patient tolerated procedure well.

## 2019-02-18 NOTE — Anesthesia Preprocedure Evaluation (Signed)
Anesthesia Evaluation  Patient identified by MRN, date of birth, ID band Patient awake    Reviewed: Allergy & Precautions, NPO status , Patient's Chart, lab work & pertinent test results  History of Anesthesia Complications (+) DIFFICULT AIRWAY and history of anesthetic complications  Airway Mallampati: II  TM Distance: >3 FB Neck ROM: Full    Dental  (+) Dental Advisory Given, Edentulous Lower, Edentulous Upper   Pulmonary asthma , Current Smoker,    Pulmonary exam normal breath sounds clear to auscultation       Cardiovascular negative cardio ROS Normal cardiovascular exam Rhythm:Regular Rate:Normal     Neuro/Psych Seizures -,  PSYCHIATRIC DISORDERS Anxiety L ear deafness     GI/Hepatic Neg liver ROS, GERD  ,  Endo/Other  negative endocrine ROS  Renal/GU negative Renal ROS     Musculoskeletal  (+) Arthritis , Osteomyelitis Right 1st Metatarsal   Abdominal   Peds  Hematology negative hematology ROS (+)   Anesthesia Other Findings Day of surgery medications reviewed with the patient.  Reproductive/Obstetrics                             Anesthesia Physical Anesthesia Plan  ASA: III  Anesthesia Plan: Regional   Post-op Pain Management:    Induction: Intravenous  PONV Risk Score and Plan: 1 and Propofol infusion and Treatment may vary due to age or medical condition  Airway Management Planned: Nasal Cannula and Natural Airway  Additional Equipment:   Intra-op Plan:   Post-operative Plan:   Informed Consent: I have reviewed the patients History and Physical, chart, labs and discussed the procedure including the risks, benefits and alternatives for the proposed anesthesia with the patient or authorized representative who has indicated his/her understanding and acceptance.     Dental advisory given  Plan Discussed with: CRNA  Anesthesia Plan Comments:          Anesthesia Quick Evaluation

## 2019-02-18 NOTE — Anesthesia Procedure Notes (Signed)
Procedure Name: MAC Date/Time: 02/18/2019 8:30 AM Performed by: Barrington Ellison, CRNA Pre-anesthesia Checklist: Patient identified, Emergency Drugs available, Suction available, Patient being monitored and Timeout performed Patient Re-evaluated:Patient Re-evaluated prior to induction Oxygen Delivery Method: Simple face mask

## 2019-02-18 NOTE — Discharge Instructions (Signed)
Mupirocin ointment apply to both nostrils two times a day for 5 days

## 2019-02-18 NOTE — Op Note (Signed)
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    The risks benefits and alternatives were discussed with the patient preoperatively including but not limited to the risks of infection, bleeding, nerve injury, cardiopulmonary complications, the need for revision surgery, among others, and the patient was willing to proceed.  OPERATIVE IMPLANTS: none  @ENCIMAGES @  OPERATIVE FINDINGS: no abscess  OPERATIVE PROCEDURE: Patient was brought the operating room after undergoing a regional block.  After adequate levels anesthesia were obtained patient's right lower extremity was prepped using DuraPrep draped into a sterile field a timeout was called.  A racquet incision was made around the first ray.  The first ray was resected from the base of the first metatarsal.  Electrocautery was used for hemostasis.  The wound was irrigated with normal saline.  The incision was closed using 2-0 nylon a sterile dressing was applied patient was taken the PACU in stable condition.   DISCHARGE PLANNING:  Antibiotic duration: Preoperative antibiotics  Weightbearing: Touchdown weightbearing on the right  Pain medication: Prescription sent in for Percocet  Dressing care/ Wound VAC: Keep dressing clean and dry until follow-up in a week  Ambulatory devices: Walker  Discharge to: Home  Follow-up: In the office 1 week post operative.

## 2019-02-18 NOTE — Anesthesia Procedure Notes (Signed)
Anesthesia Regional Block: Adductor canal block   Pre-Anesthetic Checklist: ,, timeout performed, Correct Patient, Correct Site, Correct Laterality, Correct Procedure, Correct Position, site marked, Risks and benefits discussed,  Surgical consent,  Pre-op evaluation,  At surgeon's request and post-op pain management  Laterality: Right  Prep: chloraprep       Needles:  Injection technique: Single-shot  Needle Type: Echogenic Needle     Needle Length: 9cm  Needle Gauge: 21     Additional Needles:   Procedures:,,,, ultrasound used (permanent image in chart),,,,  Narrative:  Start time: 02/18/2019 8:05 AM End time: 02/18/2019 8:10 AM Injection made incrementally with aspirations every 5 mL.  Performed by: Personally  Anesthesiologist: Catalina Gravel, MD  Additional Notes: No pain on injection. No increased resistance to injection. Injection made in 5cc increments.  Good needle visualization.  Patient tolerated procedure well.

## 2019-02-18 NOTE — Progress Notes (Signed)
Attempted to reach pharmacy for med rec but no answer.

## 2019-02-18 NOTE — H&P (Signed)
Susan Short is an 48 y.o. female.   Chief Complaint: pain ulceration right foot first metatarsal head HPI: The patient is a 48 year old woman who previously underwent a right great toe amputation in September 2008.  She reports that over the past 4 to 5 months she is noticed increased pain and swelling over the residual first metatarsal head.  She reports intermittent redness of the area.  She reports that the pain is throbbing and is hurt so bad that she has begun walking on the side of her foot as she cannot put pressure over the great toe area.  She has also complaining of pain over the second toe.  She reports that Tylenol is not helping for the pain.  She is on gabapentin 100 mg 3 times daily but reports that she is previously been on Lyrica and that this helped more and she would like to go on this. The patient was seen with the assistance of an interpreter to perform sign language for the visit.  Past Medical History:  Diagnosis Date  . Arthritis   . Asthma   . Constipation   . Deaf    left ear  . Difficult intubation    Patient reports that they had to use a small tube; MAC3, grade 1 view, 1 attempt, 7.0 ETT on 05/11/09  . GERD (gastroesophageal reflux disease)   . Hard of hearing    right  . Panic attack    Panic  . Pneumonia    years ago - recorded 02/16/2019  . Seizures ([place])    "due to Tramadol"  . Shortness of breath dyspnea    with exertion    Past Surgical History:  Procedure Laterality Date  . ABDOMINAL HYSTERECTOMY    . AMPUTATION Right 04/26/2017   Procedure: Right Great Toe Amputation;  Surgeon: Newt Minion, MD;  Location: [facility];  Service: Orthopedics;  Laterality: Right;  . APPENDECTOMY     as child  . ARTHRODESIS METATARSAL Right 08/01/2016   Procedure: Take Down Non Union, Remove Hardware, Revision Fusion Right Great Toe;  Surgeon: Newt Minion, MD;  Location: [facility];  Service: Orthopedics;  Laterality: Right;  . ARTHRODESIS METATARSALPHALANGEAL JOINT  (MTPJ) Right 05/02/2016   Procedure: ARTHRODESIS METATARSALPHALANGEAL JOINT (MTPJ) RIGHT GREAT TOE;  Surgeon: Newt Minion, MD;  Location: [facility];  Service: Orthopedics;  Laterality: Right;  . CERVICAL FUSION    . CESAREAN SECTION     x 2  . ORIF CLAVICULAR FRACTURE Right 05/05/2013   Procedure: OPEN REDUCTION INTERNAL FIXATION (ORIF) CLAVICULAR FRACTURE- right;  Surgeon: Meredith Pel, MD;  Location: [facility];  Service: Orthopedics;  Laterality: Right;  Right Clavicle Fracture Non Union Takedown and Fixation with Bone Graft, Coracoclavicular Ligament Reconstruction with Tight Rope.  Marland Kitchen SHOULDER SURGERY Right    x 3- fracture- screws,   . thumb operation Right   . TONSILLECTOMY AND ADENOIDECTOMY     as a child    History reviewed. No pertinent family history. Social History:  reports that she has been smoking. She has a 35.00 pack-year smoking history. She has never used smokeless tobacco. She reports that she does not drink alcohol or use drugs.  Allergies:  Allergies  Allergen Reactions  . Tramadol Other (See Comments)    seizures   . Ketorolac Tromethamine Hives  . Nsaids Hives  . Ibuprofen Hives and Rash    Medications Prior to Admission  Medication Sig Dispense Refill  . albuterol (PROVENTIL HFA;VENTOLIN HFA) 108 (  90 BASE) MCG/ACT inhaler Inhale 2 puffs into the lungs every 6 (six) hours as needed for wheezing or shortness of breath. Does not have 1.    . HYDROcodone-acetaminophen (NORCO) 5-325 MG tablet Take 1 tablet by mouth every 8 (eight) hours as needed for moderate pain. 15 tablet 0  . pregabalin (LYRICA) 75 MG capsule Take 1 capsule (75 mg total) by mouth 2 (two) times daily. 60 capsule 3  . traMADol (ULTRAM) 50 MG tablet Take 1 tablet (50 mg total) by mouth every 6 (six) hours as needed for moderate pain. 40 tablet 0    Results for orders placed or performed during the hospital encounter of 02/17/19 (from the past 48 hour(s))  SARS Coronavirus 2 (Performed in [facility] hospital lab)     Status: None   Collection Time: 02/17/19 12:24 PM   Specimen: Nasal Swab  Result Value Ref Range   SARS Coronavirus 2 NEGATIVE NEGATIVE    Comment: (NOTE) SARS-CoV-2 target nucleic acids are NOT DETECTED. The SARS-CoV-2 RNA is generally detectable in upper and lower respiratory specimens during the acute phase of infection. Negative results do not preclude SARS-CoV-2 infection, do not rule out co-infections with other pathogens, and should not be used as the sole basis for treatment or other patient management decisions. Negative results must be combined with clinical observations, patient history, and epidemiological information. The expected result is Negative. Fact Sheet for Patients: SugarRoll.be Fact Sheet for Healthcare Providers: https://www.Downard-mathews.com/ This test is not yet approved or cleared by the [place] FDA and  has been authorized for detection and/or diagnosis of SARS-CoV-2 by FDA under an Emergency Use Authorization (EUA). This EUA will remain  in effect (meaning this test can be used) for the duration of the COVID-19 declaration under Section 56 4(b)(1) of the Act, 21 U.S.C. section 360bbb-3(b)(1), unless the authorization is terminated or revoked sooner. Performed at [facility], [place] [address], [place], [place] [address]    No results found.  Review of Systems  All other systems reviewed and are negative.   There were no vitals taken for this visit. Physical Exam  Patient is alert, oriented, no adenopathy, well-dressed, normal affect, normal respiratory effort. Examination of the right foot shows good palpable pedal pulses.  She has a well-healed great toe amputation site but pain over the first metatarsal head to palpation, mild edema and erythema over the area.  She is also tender over the third and fourth metatarsal heads and the second toe but there is no evidence of a  sausage toe or other evidence of infection here.  She does have some mild callus over the fifth metatarsal head without ulceration.  She has excellent ankle range of motion and does not have any evidence of Achilles contracture. Assessment/Plan 1. 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 Pain in right foot   3. Osteomyelitis right first metatarsal  Plan: osteomyelitis over the residual first metatarsal head plan for excision of the first metatarsal head and the patient was agreeable to proceed with surgery at this time..  We have also recommended stiff soled shoes postoperatively to keep pressure off the front of her foot.  Will resume Lyrica 75 mg twice daily at this point but will hold off on oral pain medication until postop.   Newt Minion, MD 02/18/2019, 6:46 AM

## 2019-02-18 NOTE — Anesthesia Postprocedure Evaluation (Signed)
Anesthesia Post Note  Patient: Susan Short  Procedure(s) Performed: RIGHT FOOT 1ST RAY AMPUTATION (Right Foot)     Patient location during evaluation: PACU Anesthesia Type: Regional Level of consciousness: awake and alert Pain management: pain level controlled Vital Signs Assessment: post-procedure vital signs reviewed and stable Respiratory status: spontaneous breathing, nonlabored ventilation and respiratory function stable Cardiovascular status: stable and blood pressure returned to baseline Postop Assessment: no apparent nausea or vomiting Anesthetic complications: no    Last Vitals:  Vitals:   02/18/19 0946 02/18/19 1000  BP: 109/84   Pulse: (!) 52 (!) 57  Resp: (!) 9 13  Temp:  (!) 36.1 C  SpO2: 100% 99%    Last Pain:  Vitals:   02/18/19 1000  TempSrc:   PainSc: 10-Worst pain ever                 Catalina Gravel

## 2019-02-18 NOTE — Transfer of Care (Signed)
Immediate Anesthesia Transfer of Care Note  Patient: Susan Short  Procedure(s) Performed: RIGHT FOOT 1ST RAY AMPUTATION (Right Foot)  Patient Location: PACU  Anesthesia Type:MAC and Regional  Level of Consciousness: awake, alert  and oriented  Airway & Oxygen Therapy: Patient Spontanous Breathing  Post-op Assessment: Report given to RN  Post vital signs: Reviewed and stable  Last Vitals:  Vitals Value Taken Time  BP    Temp 36.1 C 02/18/19 0900  Pulse    Resp 12 02/18/19 0901  SpO2      Last Pain:  Vitals:   02/18/19 0713  TempSrc:   PainSc: 8       Patients Stated Pain Goal: 2 (46/50/35 4656)  Complications: No apparent anesthesia complications

## 2019-02-18 NOTE — Progress Notes (Signed)
Orthopedic Tech Progress Note Patient Details:  Susan Short 28-Sep-1970 [id number]  Ortho Devices Type of Ortho Device: Crutches, Postop shoe/boot Ortho Device/Splint Location: right Ortho Device/Splint Interventions: Application   Post Interventions Patient Tolerated: Well Instructions Provided: Care of device   [name] 02/18/2019, 9:44 AM

## 2019-02-19 ENCOUNTER — Encounter (HOSPITAL_COMMUNITY): Payer: Self-pay | Admitting: Orthopedic Surgery

## 2019-02-23 ENCOUNTER — Other Ambulatory Visit: Payer: Self-pay | Admitting: Physician Assistant

## 2019-02-23 ENCOUNTER — Telehealth: Payer: Self-pay | Admitting: Orthopedic Surgery

## 2019-02-23 DIAGNOSIS — Z89411 Acquired absence of right great toe: Secondary | ICD-10-CM

## 2019-02-23 MED ORDER — OXYCODONE-ACETAMINOPHEN 10-325 MG PO TABS: 1.0000 | ORAL_TABLET | ORAL | 0 refills | Status: DC | PRN

## 2019-02-23 NOTE — Telephone Encounter (Signed)
Shawn please advise thank you

## 2019-02-23 NOTE — Telephone Encounter (Signed)
Patient mother called for medication refill Percocet. Has only 2 left. Top of foot swollen and toes.  Requesting a call back [phone].

## 2019-02-23 NOTE — Telephone Encounter (Signed)
Sent Percocet refill into pharmacy. Also have her elevate as much as possible to help with swelling and pain.

## 2019-02-23 NOTE — Telephone Encounter (Signed)
Susan Short, patient's mother was called informed about Rx but stated that patient will keep her appt for Wednesday and that patient foot is still swollen and in pain. Stated that EMT did redress the wound when they came out and it was bleeding some. She asked if this was normal. I encouraged patient's mother that Susan Short could  be seen today but patient wanted to wait. I advised Susan Short to inform patient to continue to elevate her leg as much as possible due to not being able to see how much swelling it was and if any changes she can come in earlier or go to ER if needed.

## 2019-02-25 ENCOUNTER — Inpatient Hospital Stay: Payer: Medicaid Other | Admitting: Family

## 2019-02-26 ENCOUNTER — Ambulatory Visit (INDEPENDENT_AMBULATORY_CARE_PROVIDER_SITE_OTHER): Payer: Medicaid Other | Admitting: Physician Assistant

## 2019-02-26 ENCOUNTER — Encounter: Payer: Self-pay | Admitting: Physician Assistant

## 2019-02-26 ENCOUNTER — Other Ambulatory Visit: Payer: Self-pay

## 2019-02-26 DIAGNOSIS — Z89411 Acquired absence of right great toe: Secondary | ICD-10-CM

## 2019-02-26 MED ORDER — OXYCODONE-ACETAMINOPHEN 10-325 MG PO TABS: 1.0000 | ORAL_TABLET | ORAL | 0 refills | Status: DC | PRN

## 2019-02-26 MED ORDER — CYCLOBENZAPRINE HCL 10 MG PO TABS: 10.0000 mg | ORAL_TABLET | Freq: Three times a day (TID) | ORAL | 0 refills | Status: DC | PRN

## 2019-02-26 NOTE — Progress Notes (Signed)
Office Visit Note   Patient: Susan Short           Date of Birth: January 12, 1971           MRN: [id number] Visit Date: 02/26/2019              Requested by: Antonietta Jewel, MD [address],  [place] [address] PCP: Antonietta Jewel, MD  Chief Complaint  Patient presents with  . Right Foot - Routine Post Op    02/18/2019 right foot 1st ray amp      HPI: The patient is a 48 year old woman with significant hearing impairment who is seen with the assistance of her mother and a sign language interpreter for postoperative follow-up following a right first ray amputation on 02/18/2019.  She reports she has been elevating as much as possible but she does report walking on the side of her right foot at times.  She reports a lot of muscle spasms in her calf and requests some Flexeril as well as a refill on pain medication.  Assessment & Plan: Visit Diagnoses:  1. Acquired absence of right great toe [facility])     Plan: We will leave the sutures in for at least 1 more week.  Prescription for Flexeril and refill for her pain medication were completed this visit.  The patient was advised to weight-bear only minimally through the heel if needed for balance but otherwise offload the foot and elevate as much as possible.  She will follow-up in 1 week or sooner if difficulties in the interim.   Follow-Up Instructions: Return in about 1 week (around 03/05/2019).   Ortho Exam  Patient is alert, oriented, no adenopathy, well-dressed, normal affect, normal respiratory effort. The right foot incision is intact with sutures under tension with moderate edema.  There are no signs of cellulitis or infection.  She has palpable pedal pulses.  Imaging: No results found. No images are attached to the encounter.  Labs: Lab Results  Component Value Date   LABURIC 5.5 01/13/2018     Lab Results  Component Value Date   ALBUMIN 4.7 01/03/2012   LABURIC 5.5 01/13/2018    No results found for: MG No  results found for: VD25OH  No results found for: PREALBUMIN CBC EXTENDED Latest Ref Rng & Units 02/18/2019 04/26/2017 08/01/2016  WBC 4.0 - 10.5 K/uL 7.1 6.1 5.8  RBC 3.87 - 5.11 MIL/uL 4.55 4.30 4.38  HGB 12.0 - 15.0 g/dL 14.0 12.9 12.9  HCT 36.0 - 46.0 % 41.6 38.3 38.0  PLT 150 - 400 K/uL 263 227 263  NEUTROABS 1.7 - 7.7 K/uL - - -  LYMPHSABS 0.7 - 4.0 K/uL - - -     Body mass index is 23.91 kg/m.  Orders:  No orders of the defined types were placed in this encounter.  Meds ordered this encounter  Medications  . oxyCODONE-acetaminophen (PERCOCET) 10-325 MG tablet    Sig: Take 1 tablet by mouth every 4 (four) hours as needed for pain.    Dispense:  30 tablet    Refill:  0  . cyclobenzaprine (FLEXERIL) 10 MG tablet    Sig: Take 1 tablet (10 mg total) by mouth 3 (three) times daily as needed for muscle spasms.    Dispense:  30 tablet    Refill:  0     Procedures: No procedures performed  Clinical Data: No additional findings.  ROS:  All other systems negative, except as noted in the HPI. Review  of Systems  Objective: Vital Signs: Ht 5\' 3"  (1.6 m)   Wt 135 lb (61.2 kg)   BMI 23.91 kg/m   Specialty Comments:  No specialty comments available.  PMFS History: Patient Active Problem List   Diagnosis Date Noted  . Subacute osteomyelitis, right ankle and foot ([place])   . Neuropathic pain of foot, right 09/02/2017  . Great toe amputation status, right 05/15/2017  . 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 Retained orthopedic hardware   . Nonunion of foot fracture, right 07/13/2016  . DECREASED HEARING 10/13/2010  . ALLERGIC RHINITIS 10/13/2010  . PULMONARY NODULE 10/13/2010  . NEOPLASM, BENIGN, THYROID 09/22/2010  . HYPERLIPIDEMIA 09/22/2010   Past Medical History:  Diagnosis Date  . Arthritis   . Asthma   . Constipation   . Deaf    left ear  . Difficult intubation    Patient reports that they had to use a small tube; MAC3, grade 1 view, 1  attempt, 7.0 ETT on 05/11/09  . GERD (gastroesophageal reflux disease)   . Hard of hearing    right  . Panic attack    Panic  . Pneumonia    years ago - recorded 02/16/2019  . Seizures ([place])    "due to Tramadol"  . Shortness of breath dyspnea    with exertion    History reviewed. No pertinent family history.  Past Surgical History:  Procedure Laterality Date  . ABDOMINAL HYSTERECTOMY    . AMPUTATION Right 04/26/2017   Procedure: Right Great Toe Amputation;  Surgeon: Newt Minion, MD;  Location: [facility];  Service: Orthopedics;  Laterality: Right;  . AMPUTATION Right 02/18/2019   Procedure: RIGHT FOOT 1ST RAY AMPUTATION;  Surgeon: Newt Minion, MD;  Location: [facility];  Service: Orthopedics;  Laterality: Right;  . APPENDECTOMY     as child  . ARTHRODESIS METATARSAL Right 08/01/2016   Procedure: Take Down Non Union, Remove Hardware, Revision Fusion Right Great Toe;  Surgeon: Newt Minion, MD;  Location: [facility];  Service: Orthopedics;  Laterality: Right;  . ARTHRODESIS METATARSALPHALANGEAL JOINT (MTPJ) Right 05/02/2016   Procedure: ARTHRODESIS METATARSALPHALANGEAL JOINT (MTPJ) RIGHT GREAT TOE;  Surgeon: Newt Minion, MD;  Location: [facility];  Service: Orthopedics;  Laterality: Right;  . CERVICAL FUSION    . CESAREAN SECTION     x 2  . ORIF CLAVICULAR FRACTURE Right 05/05/2013   Procedure: OPEN REDUCTION INTERNAL FIXATION (ORIF) CLAVICULAR FRACTURE- right;  Surgeon: Meredith Pel, MD;  Location: [facility];  Service: Orthopedics;  Laterality: Right;  Right Clavicle Fracture Non Union Takedown and Fixation with Bone Graft, Coracoclavicular Ligament Reconstruction with Tight Rope.  Marland Kitchen SHOULDER SURGERY Right    x 3- fracture- screws,   . thumb operation Right   . TONSILLECTOMY AND ADENOIDECTOMY     as a child   Social History   Occupational History  . Not on file  Tobacco Use  . Smoking status: Current Every Day Smoker    Packs/day: 1.00    Years: 35.00    Pack years: 35.00  . Smokeless  tobacco: Never Used  Substance and Sexual Activity  . Alcohol use: No  . Drug use: No  . Sexual activity: Not on file

## 2019-03-05 ENCOUNTER — Ambulatory Visit (INDEPENDENT_AMBULATORY_CARE_PROVIDER_SITE_OTHER): Payer: Medicaid Other | Admitting: Physician Assistant

## 2019-03-05 ENCOUNTER — Other Ambulatory Visit: Payer: Self-pay

## 2019-03-05 ENCOUNTER — Encounter: Payer: Self-pay | Admitting: Physician Assistant

## 2019-03-05 VITALS — Ht 63.0 in | Wt 135.0 lb

## 2019-03-05 DIAGNOSIS — Z89411 Acquired absence of right great toe: Secondary | ICD-10-CM

## 2019-03-05 MED ORDER — OXYCODONE-ACETAMINOPHEN 10-325 MG PO TABS: 1.0000 | ORAL_TABLET | ORAL | 0 refills | Status: DC | PRN

## 2019-03-05 NOTE — Progress Notes (Signed)
Office Visit Note   Patient: Susan Short           Date of Birth: 09-13-1970           MRN: [id number] Visit Date: 03/05/2019              Requested by: Antonietta Jewel, MD [address] [place],  [place] [address] PCP: Antonietta Jewel, MD  Chief Complaint  Patient presents with  . Right Foot - Routine Post Op    02/18/2019 right foot 1st ray      HPI: The patient is a 48 yo woman with significant hearing impairment who is seen with the assistance of her mother and  the assistance of a sign language interpreter for post operative follow up following a right first ray amputation on 02/18/2019. She reports continued burning pain over the foot and incisional area. She is taking Lyrica 75 mg BID and oxycodone for pain. She requests a refill of her pain medication today.    Assessment & Plan: Visit Diagnoses:  1. Acquired absence of right great toe ([place])     Plan: Sutures removed today. Continue to wash foot daily with soap and water and apply dry gauze dressing. Provided prescription for shoe, spacer and insert at [facility].  Pain medication refilled this visit and discussed with the patient decreasing her medication use and will need to taper the dose soon.  Follow up in 2 weeks.   Follow-Up Instructions: Return in about 2 weeks (around 03/19/2019).   Ortho Exam  Patient is alert, oriented, no adenopathy, well-dressed, normal affect, normal respiratory effort. The right foot incision is healing well and sutures will be removed today. No signs of infection or cellulitis.  Palpable pedal pulses.  Imaging: No results found. No images are attached to the encounter.  Labs: Lab Results  Component Value Date   LABURIC 5.5 01/13/2018     Lab Results  Component Value Date   ALBUMIN 4.7 01/03/2012   LABURIC 5.5 01/13/2018    No results found for: MG No results found for: VD25OH  No results found for: PREALBUMIN CBC EXTENDED Latest Ref Rng & Units 02/18/2019 04/26/2017  08/01/2016  WBC 4.0 - 10.5 K/uL 7.1 6.1 5.8  RBC 3.87 - 5.11 MIL/uL 4.55 4.30 4.38  HGB 12.0 - 15.0 g/dL 14.0 12.9 12.9  HCT 36.0 - 46.0 % 41.6 38.3 38.0  PLT 150 - 400 K/uL 263 227 263  NEUTROABS 1.7 - 7.7 K/uL - - -  LYMPHSABS 0.7 - 4.0 K/uL - - -     Body mass index is 23.91 kg/m.  Orders:  No orders of the defined types were placed in this encounter.  Meds ordered this encounter  Medications  . oxyCODONE-acetaminophen (PERCOCET) 10-325 MG tablet    Sig: Take 1 tablet by mouth every 4 (four) hours as needed for pain.    Dispense:  30 tablet    Refill:  0     Procedures: No procedures performed  Clinical Data: No additional findings.  ROS:  All other systems negative, except as noted in the HPI. Review of Systems  Objective: Vital Signs: Ht 5\' 3"  (1.6 m)   Wt 135 lb (61.2 kg)   BMI 23.91 kg/m   Specialty Comments:  No specialty comments available.  PMFS History: Patient Active Problem List   Diagnosis Date Noted  . Subacute osteomyelitis, right ankle and foot ([place])   . Neuropathic pain of foot, right 09/02/2017  . Great toe  amputation status, right 05/15/2017  . 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 Retained orthopedic hardware   . Nonunion of foot fracture, right 07/13/2016  . DECREASED HEARING 10/13/2010  . ALLERGIC RHINITIS 10/13/2010  . PULMONARY NODULE 10/13/2010  . NEOPLASM, BENIGN, THYROID 09/22/2010  . HYPERLIPIDEMIA 09/22/2010   Past Medical History:  Diagnosis Date  . Arthritis   . Asthma   . Constipation   . Deaf    left ear  . Difficult intubation    Patient reports that they had to use a small tube; MAC3, grade 1 view, 1 attempt, 7.0 ETT on 05/11/09  . GERD (gastroesophageal reflux disease)   . Hard of hearing    right  . Panic attack    Panic  . Pneumonia    years ago - recorded 02/16/2019  . Seizures ([place])    "due to Tramadol"  . Shortness of breath dyspnea    with exertion    History reviewed. No  pertinent family history.  Past Surgical History:  Procedure Laterality Date  . ABDOMINAL HYSTERECTOMY    . AMPUTATION Right 04/26/2017   Procedure: Right Great Toe Amputation;  Surgeon: Newt Minion, MD;  Location: [facility];  Service: Orthopedics;  Laterality: Right;  . AMPUTATION Right 02/18/2019   Procedure: RIGHT FOOT 1ST RAY AMPUTATION;  Surgeon: Newt Minion, MD;  Location: [facility];  Service: Orthopedics;  Laterality: Right;  . APPENDECTOMY     as child  . ARTHRODESIS METATARSAL Right 08/01/2016   Procedure: Take Down Non Union, Remove Hardware, Revision Fusion Right Great Toe;  Surgeon: Newt Minion, MD;  Location: [facility];  Service: Orthopedics;  Laterality: Right;  . ARTHRODESIS METATARSALPHALANGEAL JOINT (MTPJ) Right 05/02/2016   Procedure: ARTHRODESIS METATARSALPHALANGEAL JOINT (MTPJ) RIGHT GREAT TOE;  Surgeon: Newt Minion, MD;  Location: [facility];  Service: Orthopedics;  Laterality: Right;  . CERVICAL FUSION    . CESAREAN SECTION     x 2  . ORIF CLAVICULAR FRACTURE Right 05/05/2013   Procedure: OPEN REDUCTION INTERNAL FIXATION (ORIF) CLAVICULAR FRACTURE- right;  Surgeon: Meredith Pel, MD;  Location: [facility];  Service: Orthopedics;  Laterality: Right;  Right Clavicle Fracture Non Union Takedown and Fixation with Bone Graft, Coracoclavicular Ligament Reconstruction with Tight Rope.  Marland Kitchen SHOULDER SURGERY Right    x 3- fracture- screws,   . thumb operation Right   . TONSILLECTOMY AND ADENOIDECTOMY     as a child   Social History   Occupational History  . Not on file  Tobacco Use  . Smoking status: Current Every Day Smoker    Packs/day: 1.00    Years: 35.00    Pack years: 35.00  . Smokeless tobacco: Never Used  Substance and Sexual Activity  . Alcohol use: No  . Drug use: No  . Sexual activity: Not on file

## 2019-03-11 ENCOUNTER — Other Ambulatory Visit: Payer: Self-pay

## 2019-03-11 ENCOUNTER — Ambulatory Visit (INDEPENDENT_AMBULATORY_CARE_PROVIDER_SITE_OTHER): Payer: Medicaid Other | Admitting: Orthopedic Surgery

## 2019-03-11 ENCOUNTER — Encounter: Payer: Self-pay | Admitting: Orthopedic Surgery

## 2019-03-11 ENCOUNTER — Ambulatory Visit (INDEPENDENT_AMBULATORY_CARE_PROVIDER_SITE_OTHER): Payer: Medicaid Other

## 2019-03-11 DIAGNOSIS — M25512 Pain in left shoulder: Secondary | ICD-10-CM | POA: Diagnosis not present

## 2019-03-11 DIAGNOSIS — Z89411 Acquired absence of right great toe: Secondary | ICD-10-CM | POA: Diagnosis not present

## 2019-03-11 MED ORDER — DOXYCYCLINE HYCLATE 100 MG PO TABS: 100.0000 mg | ORAL_TABLET | Freq: Two times a day (BID) | ORAL | 0 refills | Status: DC

## 2019-03-11 MED ORDER — OXYCODONE-ACETAMINOPHEN 10-325 MG PO TABS: 1.0000 | ORAL_TABLET | Freq: Four times a day (QID) | ORAL | 0 refills | Status: DC | PRN

## 2019-03-11 NOTE — Progress Notes (Signed)
Post-Op Visit Note   Patient: Susan Short           Date of Birth: 03/12/1971           MRN: [id number] Visit Date: 03/11/2019 PCP: Antonietta Jewel, MD  Chief Complaint: No chief complaint on file.   HPI:  HPI The patient is a 48 year old woman seen today status post right first ray amputation 3 weeks ago.  The patient is concerned about her incision she was unsure how deep and open area was and explored the incision with a Q-tip was worried that this was deep.  She has not been having any fever or chills no drainage she is requesting a new shoe to wear.  Also would like a refill of her pain medicine. Ortho Exam On examination of the incision majority of the incision is well-healed however in the center of the incision there is an open area that is about 5 mm in diameter this is 5 mm deep.  There is no active drainage no surrounding erythema no maceration no odor her foot with minimal swelling however she is having some tenderness along her incision.  Visit Diagnoses:  1. Acute pain of left shoulder   2. 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 Acquired absence of right great toe [facility])     Plan: We will place her on some doxycycline discussed that this will have to heal open cannot apply sutures today.  She will follow-up in the office in 1 week.  Did refill her oxycodone.  Discussed the importance of nonweightbearing.  Follow-Up Instructions: Return in about 1 week (around 03/18/2019).   Imaging: No results found.  Orders:  Orders Placed This Encounter  Procedures  . XR Shoulder Left   Meds ordered this encounter  Medications  . oxyCODONE-acetaminophen (PERCOCET) 10-325 MG tablet    Sig: Take 1 tablet by mouth every 6 (six) hours as needed for pain.    Dispense:  20 tablet    Refill:  0  . doxycycline (VIBRA-TABS) 100 MG tablet    Sig: Take 1 tablet (100 mg total) by mouth 2 (two) times daily.    Dispense:  60 tablet    Refill:  0      PMFS History: Patient Active Problem List   Diagnosis Date Noted  . 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 Subacute osteomyelitis, right ankle and foot ([place])   . Neuropathic pain of foot, right 09/02/2017  . Great toe amputation status, right 05/15/2017  . 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 Retained orthopedic hardware   . Nonunion of foot fracture, right 07/13/2016  . DECREASED HEARING 10/13/2010  . ALLERGIC RHINITIS 10/13/2010  . PULMONARY NODULE 10/13/2010  . NEOPLASM, BENIGN, THYROID 09/22/2010  . HYPERLIPIDEMIA 09/22/2010   Past Medical History:  Diagnosis Date  . Arthritis   . Asthma   . Constipation   . Deaf    left ear  . Difficult intubation    Patient reports that they had to use a small tube; MAC3, grade 1 view, 1 attempt, 7.0 ETT on 05/11/09  . GERD (gastroesophageal reflux disease)   . Hard of hearing    right  . Panic attack    Panic  . Pneumonia    years ago - recorded 02/16/2019  . Seizures ([place])    "due to Tramadol"  . Shortness of breath dyspnea  with exertion    History reviewed. No pertinent family history.  Past Surgical History:  Procedure Laterality Date  . ABDOMINAL HYSTERECTOMY    . AMPUTATION Right 04/26/2017   Procedure: Right Great Toe Amputation;  Surgeon: Newt Minion, MD;  Location: [facility];  Service: Orthopedics;  Laterality: Right;  . AMPUTATION Right 02/18/2019   Procedure: RIGHT FOOT 1ST RAY AMPUTATION;  Surgeon: Newt Minion, MD;  Location: [facility];  Service: Orthopedics;  Laterality: Right;  . APPENDECTOMY     as child  . ARTHRODESIS METATARSAL Right 08/01/2016   Procedure: Take Down Non Union, Remove Hardware, Revision Fusion Right Great Toe;  Surgeon: Newt Minion, MD;  Location: [facility];  Service: Orthopedics;  Laterality: Right;  . ARTHRODESIS METATARSALPHALANGEAL JOINT (MTPJ) Right 05/02/2016   Procedure: ARTHRODESIS METATARSALPHALANGEAL JOINT (MTPJ) RIGHT GREAT TOE;   Surgeon: Newt Minion, MD;  Location: [facility];  Service: Orthopedics;  Laterality: Right;  . CERVICAL FUSION    . CESAREAN SECTION     x 2  . ORIF CLAVICULAR FRACTURE Right 05/05/2013   Procedure: OPEN REDUCTION INTERNAL FIXATION (ORIF) CLAVICULAR FRACTURE- right;  Surgeon: Meredith Pel, MD;  Location: [facility];  Service: Orthopedics;  Laterality: Right;  Right Clavicle Fracture Non Union Takedown and Fixation with Bone Graft, Coracoclavicular Ligament Reconstruction with Tight Rope.  Marland Kitchen SHOULDER SURGERY Right    x 3- fracture- screws,   . thumb operation Right   . TONSILLECTOMY AND ADENOIDECTOMY     as a child   Social History   Occupational History  . Not on file  Tobacco Use  . Smoking status: Current Every Day Smoker    Packs/day: 1.00    Years: 35.00    Pack years: 35.00  . Smokeless tobacco: Never Used  Substance and Sexual Activity  . Alcohol use: No  . Drug use: No  . Sexual activity: Not on file
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
## 2019-03-16 ENCOUNTER — Ambulatory Visit (INDEPENDENT_AMBULATORY_CARE_PROVIDER_SITE_OTHER): Payer: Medicaid Other | Admitting: Orthopedic Surgery

## 2019-03-16 ENCOUNTER — Telehealth: Payer: Self-pay

## 2019-03-16 ENCOUNTER — Encounter: Payer: Self-pay | Admitting: Orthopedic Surgery

## 2019-03-16 VITALS — Ht 63.0 in | Wt 135.0 lb

## 2019-03-16 DIAGNOSIS — Z89411 Acquired absence of right great toe: Secondary | ICD-10-CM

## 2019-03-16 MED ORDER — SULFAMETHOXAZOLE-TRIMETHOPRIM 800-160 MG PO TABS: 1.0000 | ORAL_TABLET | Freq: Two times a day (BID) | ORAL | 0 refills | Status: DC

## 2019-03-16 MED ORDER — OXYCODONE-ACETAMINOPHEN 5-325 MG PO TABS: 1.0000 | ORAL_TABLET | Freq: Four times a day (QID) | ORAL | 0 refills | Status: DC | PRN

## 2019-03-16 MED ORDER — MUPIROCIN 2 % EX OINT: 1.0000 "application " | TOPICAL_OINTMENT | Freq: Two times a day (BID) | CUTANEOUS | 3 refills | Status: DC

## 2019-03-16 NOTE — Telephone Encounter (Signed)
Called and left a VM for patient to call back concerning right foot.

## 2019-03-16 NOTE — Progress Notes (Signed)
Office Visit Note   Patient: Susan Short           Date of Birth: 04-Jun-1971           MRN: [id number] Visit Date: 03/16/2019              Requested by: Antonietta Jewel, MD [address] [place],  [place] [address] PCP: Antonietta Jewel, MD  Chief Complaint  Patient presents with  . Right Foot - Routine Post Op    02/18/2019 right foot 1st ray amp      HPI: Patient is a 48 year old woman who presents about 4 weeks status post right foot first ray amputation patient complains of redness and pain as well as a small open wound.  Patient states she has been probing the wound and states that it is very deep.  Assessment & Plan: Visit Diagnoses:  1. 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  She is given a last prescription for Percocet.  Follow-Up Instructions: Return in about 3 weeks (around 04/06/2019).   Ortho Exam  Patient is alert, oriented, no adenopathy, well-dressed, normal affect, normal respiratory effort. Examination patient's foot is caked in dirt.  Patient states she has been washing it daily.  Patient states that she is still smoking.  Examination there is about a centimeter of redness around the incision there is a wound that is 1 mm in diameter 3 3 mm in width.  There is no purulent drainage but patient has generalized tenderness to palpation around the foot.  Reinforced the importance of minimizing weightbearing on her foot to allow for the incision to heal and the importance of daily cleansing as well as the importance of smoking cessation.  Imaging: No results found. No images are attached to the encounter.  Labs: Lab Results  Component Value Date   LABURIC 5.5 01/13/2018     Lab Results   Component Value Date   ALBUMIN 4.7 01/03/2012   LABURIC 5.5 01/13/2018    No results found for: MG No results found for: VD25OH  No results found for: PREALBUMIN CBC EXTENDED Latest Ref Rng & Units 02/18/2019 04/26/2017 08/01/2016  WBC 4.0 - 10.5 K/uL 7.1 6.1 5.8  RBC 3.87 - 5.11 MIL/uL 4.55 4.30 4.38  HGB 12.0 - 15.0 g/dL 14.0 12.9 12.9  HCT 36.0 - 46.0 % 41.6 38.3 38.0  PLT 150 - 400 K/uL 263 227 263  NEUTROABS 1.7 - 7.7 K/uL - - -  LYMPHSABS 0.7 - 4.0 K/uL - - -     Body mass index is 23.91 kg/m.  Orders:  No orders of the defined types were placed in this encounter.  Meds ordered this encounter  Medications  . oxyCODONE-acetaminophen (PERCOCET/ROXICET) 5-325 MG tablet    Sig: Take 1 tablet by mouth every 6 (six) hours as needed for severe pain.    Dispense:  28 tablet    Refill:  0  . sulfamethoxazole-trimethoprim (BACTRIM DS) 800-160 MG tablet    Sig: Take 1 tablet by mouth 2 (two) times daily.    Dispense:  40 tablet    Refill:  0  . mupirocin ointment (BACTROBAN) 2 %    Sig: Apply 1 application topically 2 (  two) times daily. Apply to the affected area 2 times a day    Dispense:  22 g    Refill:  3     Procedures: No procedures performed  Clinical Data: No additional findings.  ROS:  All other systems negative, except as noted in the HPI. Review of Systems  Objective: Vital Signs: Ht 5\' 3"  (1.6 m)   Wt 135 lb (61.2 kg)   BMI 23.91 kg/m   Specialty Comments:  No specialty comments available.  PMFS History: Patient Active Problem List   Diagnosis Date Noted  . 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 Subacute osteomyelitis, right ankle and foot ([place])   . Neuropathic pain of foot, right 09/02/2017  . Great toe amputation status, right 05/15/2017  . 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 Retained orthopedic hardware   . Nonunion of foot fracture, right 07/13/2016  . DECREASED HEARING  10/13/2010  . ALLERGIC RHINITIS 10/13/2010  . PULMONARY NODULE 10/13/2010  . NEOPLASM, BENIGN, THYROID 09/22/2010  . HYPERLIPIDEMIA 09/22/2010   Past Medical History:  Diagnosis Date  . Arthritis   . Asthma   . Constipation   . Deaf    left ear  . Difficult intubation    Patient reports that they had to use a small tube; MAC3, grade 1 view, 1 attempt, 7.0 ETT on 05/11/09  . GERD (gastroesophageal reflux disease)   . Hard of hearing    right  . Panic attack    Panic  . Pneumonia    years ago - recorded 02/16/2019  . Seizures ([place])    "due to Tramadol"  . Shortness of breath dyspnea    with exertion    History reviewed. No pertinent family history.  Past Surgical History:  Procedure Laterality Date  . ABDOMINAL HYSTERECTOMY    . AMPUTATION Right 04/26/2017   Procedure: Right Great Toe Amputation;  Surgeon: Newt Minion, MD;  Location: [facility];  Service: Orthopedics;  Laterality: Right;  . AMPUTATION Right 02/18/2019   Procedure: RIGHT FOOT 1ST RAY AMPUTATION;  Surgeon: Newt Minion, MD;  Location: [facility];  Service: Orthopedics;  Laterality: Right;  . APPENDECTOMY     as child  . ARTHRODESIS METATARSAL Right 08/01/2016   Procedure: Take Down Non Union, Remove Hardware, Revision Fusion Right Great Toe;  Surgeon: Newt Minion, MD;  Location: [facility];  Service: Orthopedics;  Laterality: Right;  . ARTHRODESIS METATARSALPHALANGEAL JOINT (MTPJ) Right 05/02/2016   Procedure: ARTHRODESIS METATARSALPHALANGEAL JOINT (MTPJ) RIGHT GREAT TOE;  Surgeon: Newt Minion, MD;  Location: [facility];  Service: Orthopedics;  Laterality: Right;  . CERVICAL FUSION    . CESAREAN SECTION     x 2  . ORIF CLAVICULAR FRACTURE Right 05/05/2013   Procedure: OPEN REDUCTION INTERNAL FIXATION (ORIF) CLAVICULAR FRACTURE- right;  Surgeon: Meredith Pel, MD;  Location: [facility];  Service: Orthopedics;  Laterality: Right;  Right Clavicle Fracture Non Union Takedown and Fixation with Bone Graft, Coracoclavicular Ligament  Reconstruction with Tight Rope.  Marland Kitchen SHOULDER SURGERY Right    x 3- fracture- screws,   . thumb operation Right   . TONSILLECTOMY AND ADENOIDECTOMY     as a child   Social History   Occupational History  . Not on file  Tobacco Use  . Smoking status: Current Every Day Smoker    Packs/day: 1.00    Years: 35.00    Pack years: 35.00  . Smokeless tobacco:  Never Used  Substance and Sexual Activity  . Alcohol use: No  . Drug use: No  . Sexual activity: Not on file

## 2019-03-19 ENCOUNTER — Ambulatory Visit: Payer: Medicaid Other | Admitting: Orthopedic Surgery

## 2019-03-23 ENCOUNTER — Telehealth: Payer: Self-pay | Admitting: Orthopedic Surgery

## 2019-03-23 NOTE — Telephone Encounter (Signed)
Erin, please advise thank you.

## 2019-03-23 NOTE — Telephone Encounter (Signed)
Patient's mother [name] called advised patient need Rx refilled (Percocet 10 mg) The number to contact [name] is [phone]

## 2019-03-23 NOTE — Telephone Encounter (Signed)
Will hold message until tomorrow, will follow-up with Erin. Patient last Rx was prescribed on 03/16/2019 for Qty#28.

## 2019-03-23 NOTE — Telephone Encounter (Signed)
Patient's mother left a voicemail message requesting an RX refill on her pain medication.  CB#[phone].  Thank you.

## 2019-03-24 MED ORDER — OXYCODONE-ACETAMINOPHEN 5-325 MG PO TABS: 1.0000 | ORAL_TABLET | Freq: Three times a day (TID) | ORAL | 0 refills | Status: DC | PRN

## 2019-03-24 NOTE — Telephone Encounter (Signed)
Erin please advise, thank you.  

## 2019-04-01 ENCOUNTER — Encounter: Payer: Self-pay | Admitting: Family

## 2019-04-01 ENCOUNTER — Ambulatory Visit (INDEPENDENT_AMBULATORY_CARE_PROVIDER_SITE_OTHER): Payer: Medicaid Other | Admitting: Family

## 2019-04-01 VITALS — Ht 63.0 in | Wt 135.0 lb

## 2019-04-01 DIAGNOSIS — Z89411 Acquired absence of right great toe: Secondary | ICD-10-CM

## 2019-04-01 MED ORDER — ACETAMINOPHEN-CODEINE #3 300-30 MG PO TABS: 1.0000 | ORAL_TABLET | Freq: Three times a day (TID) | ORAL | 0 refills | Status: DC | PRN

## 2019-04-01 MED ORDER — PREGABALIN 100 MG PO CAPS: 100.0000 mg | ORAL_CAPSULE | Freq: Two times a day (BID) | ORAL | 1 refills | Status: DC

## 2019-04-01 NOTE — Progress Notes (Signed)
Office Visit Note   Patient: Susan Short           Date of Birth: 1971-06-24           MRN: [id number] Visit Date: 04/01/2019              Requested by: Antonietta Jewel, MD [address] [place],  [place] [address] PCP: Antonietta Jewel, MD  Chief Complaint  Patient presents with  . Right Foot - Routine Post Op    02/18/19 right foot 1st ray amputation       HPI: Patient is a 48 year old woman who presents about 6 weeks status post right foot first ray amputation.  She continues to complain of intense pain.  Some redness around her small wound along her incision.  Concerned that this is not healing.  Pointing to the bony prominence over the dorsum of her foot feels this is bigger than normal.  Would like her Lyrica dose increased.  States she is having excruciating pain.  Difficulty sleeping due to pain.  Requesting Percocet refill as well.  Having some numbness and tingling worsening of her pain    Assessment & Plan: Visit Diagnoses:  1. 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  Will provide a one-time prescription for Tylenol 3.  Did increase her Lyrica to 100 mg twice daily.  Follow-Up Instructions: Return in about 15 days (around 04/16/2019).   Ortho Exam  Patient is alert, oriented, no adenopathy, well-dressed, normal affect, normal respiratory effort. On examination of the patient's right foot.  There is 1 small 2 mm in diameter area of her incision that is yet to heal this is 1 mm deep there is granulation in the wound bed.  There is some very mild surrounding erythema there is no warmth no ascending cellulitis no drainage no odor.  Reinforced the importance of minimizing weightbearing on her foot  to allow for the incision to heal and the importance of daily cleansing as well as the importance of smoking cessation.  Imaging: No results found. No images are attached to the encounter.  Labs: Lab Results  Component Value Date   LABURIC 5.5 01/13/2018     Lab Results  Component Value Date   ALBUMIN 4.7 01/03/2012   LABURIC 5.5 01/13/2018    No results found for: MG No results found for: VD25OH  No results found for: PREALBUMIN CBC EXTENDED Latest Ref Rng & Units 02/18/2019 04/26/2017 08/01/2016  WBC 4.0 - 10.5 K/uL 7.1 6.1 5.8  RBC 3.87 - 5.11 MIL/uL 4.55 4.30 4.38  HGB 12.0 - 15.0 g/dL 14.0 12.9 12.9  HCT 36.0 - 46.0 % 41.6 38.3 38.0  PLT 150 - 400 K/uL 263 227 263  NEUTROABS 1.7 - 7.7 K/uL - - -  LYMPHSABS 0.7 - 4.0 K/uL - - -     Body mass index is 23.91 kg/m.  Orders:  No orders of the defined types were placed in this encounter.  Meds ordered this encounter  Medications  . acetaminophen-codeine (TYLENOL #3) 300-30 MG tablet    Sig: Take 1 tablet by mouth every 8 (eight) hours as needed for moderate pain.    Dispense:  20 tablet  Refill:  0  . pregabalin (LYRICA) 100 MG capsule    Sig: Take 1 capsule (100 mg total) by mouth 2 (two) times daily.    Dispense:  60 capsule    Refill:  1     Procedures: No procedures performed  Clinical Data: No additional findings.  ROS:  All other systems negative, except as noted in the HPI. Review of Systems  Constitutional: Negative for chills and fever.  Musculoskeletal: Positive for arthralgias and myalgias.  Skin: Positive for wound. Negative for color change.  Neurological: Positive for numbness.    Objective: Vital Signs: Ht 5\' 3"  (1.6 m)   Wt 135 lb (61.2 kg)   BMI 23.91 kg/m   Specialty Comments:  No specialty comments available.  PMFS History: Patient Active Problem List   Diagnosis Date Noted  . 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 Subacute  osteomyelitis, right ankle and foot ([place])   . Neuropathic pain of foot, right 09/02/2017  . Great toe amputation status, right 05/15/2017  . 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 Retained orthopedic hardware   . Nonunion of foot fracture, right 07/13/2016  . DECREASED HEARING 10/13/2010  . ALLERGIC RHINITIS 10/13/2010  . PULMONARY NODULE 10/13/2010  . NEOPLASM, BENIGN, THYROID 09/22/2010  . HYPERLIPIDEMIA 09/22/2010   Past Medical History:  Diagnosis Date  . Arthritis   . Asthma   . Constipation   . Deaf    left ear  . Difficult intubation    Patient reports that they had to use a small tube; MAC3, grade 1 view, 1 attempt, 7.0 ETT on 05/11/09  . GERD (gastroesophageal reflux disease)   . Hard of hearing    right  . Panic attack    Panic  . Pneumonia    years ago - recorded 02/16/2019  . Seizures ([place])    "due to Tramadol"  . Shortness of breath dyspnea    with exertion    History reviewed. No pertinent family history.  Past Surgical History:  Procedure Laterality Date  . ABDOMINAL HYSTERECTOMY    . AMPUTATION Right 04/26/2017   Procedure: Right Great Toe Amputation;  Surgeon: Newt Minion, MD;  Location: [facility];  Service: Orthopedics;  Laterality: Right;  . AMPUTATION Right 02/18/2019   Procedure: RIGHT FOOT 1ST RAY AMPUTATION;  Surgeon: Newt Minion, MD;  Location: [facility];  Service: Orthopedics;  Laterality: Right;  . APPENDECTOMY     as child  . ARTHRODESIS METATARSAL Right 08/01/2016   Procedure: Take Down Non Union, Remove Hardware, Revision Fusion Right Great Toe;  Surgeon: Newt Minion, MD;  Location: [facility];  Service: Orthopedics;  Laterality: Right;  . ARTHRODESIS METATARSALPHALANGEAL JOINT (MTPJ) Right 05/02/2016   Procedure: ARTHRODESIS METATARSALPHALANGEAL JOINT (MTPJ) RIGHT GREAT TOE;  Surgeon: Newt Minion, MD;  Location: [facility];  Service: Orthopedics;  Laterality: Right;  . CERVICAL FUSION    . CESAREAN SECTION     x 2  . ORIF CLAVICULAR  FRACTURE Right 05/05/2013   Procedure: OPEN REDUCTION INTERNAL FIXATION (ORIF) CLAVICULAR FRACTURE- right;  Surgeon: Meredith Pel, MD;  Location: [facility];  Service: Orthopedics;  Laterality: Right;  Right Clavicle Fracture Non Union Takedown and Fixation with Bone Graft, Coracoclavicular Ligament Reconstruction with Tight Rope.  Marland Kitchen SHOULDER SURGERY Right    x 3- fracture- screws,   . thumb operation Right   . TONSILLECTOMY AND ADENOIDECTOMY     as a child  Social History   Occupational History  . Not on file  Tobacco Use  . Smoking status: Current Every Day Smoker    Packs/day: 1.00    Years: 35.00    Pack years: 35.00  . Smokeless tobacco: Never Used  Substance and Sexual Activity  . Alcohol use: No  . Drug use: No  . Sexual activity: Not on file

## 2019-04-06 ENCOUNTER — Ambulatory Visit (INDEPENDENT_AMBULATORY_CARE_PROVIDER_SITE_OTHER): Payer: Medicaid Other | Admitting: Orthopedic Surgery

## 2019-04-06 ENCOUNTER — Encounter: Payer: Self-pay | Admitting: Orthopedic Surgery

## 2019-04-06 ENCOUNTER — Telehealth: Payer: Self-pay | Admitting: Orthopedic Surgery

## 2019-04-06 VITALS — Ht 63.0 in | Wt 135.0 lb

## 2019-04-06 DIAGNOSIS — M79671 Pain in right foot: Secondary | ICD-10-CM

## 2019-04-06 MED ORDER — ZOLPIDEM TARTRATE ER 6.25 MG PO TBCR: 6.2500 mg | EXTENDED_RELEASE_TABLET | Freq: Every evening | ORAL | 0 refills | Status: DC | PRN

## 2019-04-06 MED ORDER — TRAMADOL HCL 50 MG PO TABS: 50.0000 mg | ORAL_TABLET | Freq: Four times a day (QID) | ORAL | 0 refills | Status: DC | PRN

## 2019-04-06 NOTE — Progress Notes (Signed)
Office Visit Note   Patient: Susan Short           Date of Birth: [birth year]           MRN: [id number] Visit Date: 04/06/2019              Requested by: Antonietta Jewel, MD [address] [place],  [place] [address] PCP: Antonietta Jewel, MD  Chief Complaint  Patient presents with  . Right Foot - Routine Post Op    02/18/19 right foot 1st ray amputation       HPI: Patient is a 48 year old woman who presents in follow-up she status post right foot first ray amputation.  Patient complains of global pain around her foot and second toe she states she has throbbing burning pain.  Patient feels like she has a knot on the dorsum of her foot.  Patient's narcotic usage was reviewed her risk score was a 640.  Patient is 6 weeks status post right foot first ray amputation.  Assessment & Plan: Visit Diagnoses:  1. Pain in right foot     Plan: Recommended patient follow-up with the pain clinic she states she has been to a pain clinic in the past but no longer has transportation to get to a pain clinic.  She states that tramadol will work for her she states that she has had a previous reaction to Toradol but states she has had no reactions to tramadol.  We will call in a prescription for tramadol for her pain and Ambien.  Again discussed the importance of smoking cessation.  Follow-up in 4 weeks.  Recommended coconut water for her cramping.  This was written down on a prescription pad for her.  Follow-Up Instructions: Return in about 4 weeks (around 05/04/2019).   Ortho Exam  Patient is alert, oriented, no adenopathy, well-dressed, normal affect, normal respiratory effort. On examination of patient's right foot she has no redness no swelling no cellulitis no signs of infection.  The bony prominence across the midfoot is equal on both feet there is no bony prominence on the right foot.  She complains of burning pain in the second toe and there is no swelling no cellulitis no angular deformity  no signs of infection.  Patient has exquisite tenderness to light touch globally around her foot.  No organic etiology apparent for the hypersensitivity to light touch.  Imaging: No results found. No images are attached to the encounter.  Labs: Lab Results  Component Value Date   LABURIC 5.5 01/13/2018     Lab Results  Component Value Date   ALBUMIN 4.7 01/03/2012   LABURIC 5.5 01/13/2018    No results found for: MG No results found for: VD25OH  No results found for: PREALBUMIN CBC EXTENDED Latest Ref Rng & Units 02/18/2019 04/26/2017 08/01/2016  WBC 4.0 - 10.5 K/uL 7.1 6.1 5.8  RBC 3.87 - 5.11 MIL/uL 4.55 4.30 4.38  HGB 12.0 - 15.0 g/dL 14.0 12.9 12.9  HCT 36.0 - 46.0 % 41.6 38.3 38.0  PLT 150 - 400 K/uL 263 227 263  NEUTROABS 1.7 - 7.7 K/uL - - -  LYMPHSABS 0.7 - 4.0 K/uL - - -     Body mass index is 23.91 kg/m.  Orders:  No orders of the defined types were placed in this encounter.  No orders of the defined types were placed in this encounter.    Procedures: No procedures performed  Clinical Data: No additional findings.  ROS:  All other  systems negative, except as noted in the HPI. Review of Systems  Objective: Vital Signs: Ht 5\' 3"  (1.6 m)   Wt 135 lb (61.2 kg)   BMI 23.91 kg/m   Specialty Comments:  No specialty comments available.  PMFS History: Patient Active Problem List   Diagnosis Date Noted  . 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 Subacute osteomyelitis, right ankle and foot ([place])   . Neuropathic pain of foot, right 09/02/2017  . Great toe amputation status, right 05/15/2017  . 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 Retained orthopedic hardware   . Nonunion of foot fracture, right 07/13/2016  . DECREASED HEARING 10/13/2010  . ALLERGIC RHINITIS 10/13/2010  . PULMONARY NODULE 10/13/2010  . NEOPLASM, BENIGN, THYROID 09/22/2010  . HYPERLIPIDEMIA 09/22/2010   Past Medical  History:  Diagnosis Date  . Arthritis   . Asthma   . Constipation   . Deaf    left ear  . Difficult intubation    Patient reports that they had to use a small tube; MAC3, grade 1 view, 1 attempt, 7.0 ETT on 05/11/09  . GERD (gastroesophageal reflux disease)   . Hard of hearing    right  . Panic attack    Panic  . Pneumonia    years ago - recorded 02/16/2019  . Seizures ([place])    "due to Tramadol"  . Shortness of breath dyspnea    with exertion    History reviewed. No pertinent family history.  Past Surgical History:  Procedure Laterality Date  . ABDOMINAL HYSTERECTOMY    . AMPUTATION Right 04/26/2017   Procedure: Right Great Toe Amputation;  Surgeon: Newt Minion, MD;  Location: [facility];  Service: Orthopedics;  Laterality: Right;  . AMPUTATION Right 02/18/2019   Procedure: RIGHT FOOT 1ST RAY AMPUTATION;  Surgeon: Newt Minion, MD;  Location: [facility];  Service: Orthopedics;  Laterality: Right;  . APPENDECTOMY     as child  . ARTHRODESIS METATARSAL Right 08/01/2016   Procedure: Take Down Non Union, Remove Hardware, Revision Fusion Right Great Toe;  Surgeon: Newt Minion, MD;  Location: [facility];  Service: Orthopedics;  Laterality: Right;  . ARTHRODESIS METATARSALPHALANGEAL JOINT (MTPJ) Right 05/02/2016   Procedure: ARTHRODESIS METATARSALPHALANGEAL JOINT (MTPJ) RIGHT GREAT TOE;  Surgeon: Newt Minion, MD;  Location: [facility];  Service: Orthopedics;  Laterality: Right;  . CERVICAL FUSION    . CESAREAN SECTION     x 2  . ORIF CLAVICULAR FRACTURE Right 05/05/2013   Procedure: OPEN REDUCTION INTERNAL FIXATION (ORIF) CLAVICULAR FRACTURE- right;  Surgeon: Meredith Pel, MD;  Location: [facility];  Service: Orthopedics;  Laterality: Right;  Right Clavicle Fracture Non Union Takedown and Fixation with Bone Graft, Coracoclavicular Ligament Reconstruction with Tight Rope.  Marland Kitchen SHOULDER SURGERY Right    x 3- fracture- screws,   . thumb operation Right   . TONSILLECTOMY AND ADENOIDECTOMY     as a child    Social History   Occupational History  . Not on file  Tobacco Use  . Smoking status: Current Every Day Smoker    Packs/day: 1.00    Years: 35.00    Pack years: 35.00  . Smokeless tobacco: Never Used  Substance and Sexual Activity  . Alcohol use: No  . Drug use: No  . Sexual activity: Not on file

## 2019-04-06 NOTE — Telephone Encounter (Signed)
Patient's mother called stating that the pharmacy will not fill the Ambien until we get a PA through [facility] for this medication.  CB#[phone].  Thank you.

## 2019-04-07 NOTE — Telephone Encounter (Signed)
Pt called in again Ambien said she needs a PA through medicaid before it can be filled.   [phone]

## 2019-04-07 NOTE — Telephone Encounter (Signed)
I called to advise that this can not be obtained from our office she should call PCP

## 2019-04-07 NOTE — Telephone Encounter (Signed)
I called and no answer voicemail is full I will try again later.

## 2019-04-08 ENCOUNTER — Telehealth: Payer: Self-pay | Admitting: Orthopedic Surgery

## 2019-04-08 NOTE — Telephone Encounter (Signed)
I called and sw pt's mother. She is very concerned and does not want her daughter to know that she called but that we need to be aware that the pt has seizure while on tramadol and she does not want this to be given to her anymore. She also states that she does not want the pt to get the rx for Ambien. I advised that this had already been sent to the pharm and that we would not do the prior auth for this and had explained that to her yesterday. Pt's mother said she does not pt to know she called abut that she just con not tolerate this medication.

## 2019-04-08 NOTE — Telephone Encounter (Signed)
Patient's mother called concerned about [name] prescribing the Tramadol for the patient due the patient having seizures when she is on that medication.  She also wants [name] to cancel the RX for the Ambien.  CB#[phone].  Thank you.

## 2019-04-11 ENCOUNTER — Other Ambulatory Visit: Payer: Self-pay | Admitting: Family

## 2019-04-15 ENCOUNTER — Ambulatory Visit (INDEPENDENT_AMBULATORY_CARE_PROVIDER_SITE_OTHER): Payer: Medicaid Other | Admitting: Family

## 2019-04-15 ENCOUNTER — Encounter: Payer: Self-pay | Admitting: Family

## 2019-04-15 VITALS — Ht 63.0 in | Wt 135.0 lb

## 2019-04-15 DIAGNOSIS — Z89411 Acquired absence of right great toe: Secondary | ICD-10-CM

## 2019-04-15 DIAGNOSIS — M79671 Pain in right foot: Secondary | ICD-10-CM

## 2019-04-15 MED ORDER — ACETAMINOPHEN-CODEINE #3 300-30 MG PO TABS: 1.0000 | ORAL_TABLET | Freq: Two times a day (BID) | ORAL | 0 refills | Status: DC | PRN

## 2019-04-15 NOTE — Progress Notes (Signed)
Office Visit Note   Patient: Susan Short           Date of Birth: [birth year]           MRN: [id number] Visit Date: 04/15/2019              Requested by: Antonietta Jewel, MD [address] [place],  [place] [address] PCP: Antonietta Jewel, MD  Chief Complaint  Patient presents with  . Right Foot - Routine Post Op    02/18/19 right foot 1st ray amputation       HPI: Patient is a 48 year old woman who presents in follow-up she status post right foot first ray amputation.  Patient complains of global pain around her foot and second toe she states she has throbbing burning pain.  Patient feels like she has a knot on the dorsum of her foot. This is her most distressing symptom. Is requesting surgery to remove the bone or shave down the bone. Feels her soft tissue swelling is bony.   She is requesting a refill of her pain medication.  Requesting tramadol.  Requesting Flexeril.  Has increased her Lyrica and feels this has helped marginally.  Patient is 8 weeks status post right foot first ray amputation.  Assessment & Plan: Visit Diagnoses:  No diagnosis found.  Plan: She states she no longer has transportation to get to a pain clinic.  Discussed pain medicine at length.  Declined to fill tramadol.  Did give 1 last refill of her Tylenol 3.  Declined to refill Flexeril.  Follow-up in 4 weeks.  Follow-Up Instructions: Return if symptoms worsen or fail to improve.   Ortho Exam  Patient is alert, oriented, no adenopathy, well-dressed, normal affect, normal respiratory effort. On examination of patient's right foot she has no redness no swelling no cellulitis no signs of infection.  The bony prominence across the midfoot is equal on both feet there is no bony prominence on the right foot.  She complains of burning pain in the second toe and there is no swelling no cellulitis no angular deformity no signs of infection.  Patient displays exquisite tenderness to light touch globally around her  foot.   Imaging: No results found. No images are attached to the encounter.  Labs: Lab Results  Component Value Date   LABURIC 5.5 01/13/2018     Lab Results  Component Value Date   ALBUMIN 4.7 01/03/2012   LABURIC 5.5 01/13/2018    No results found for: MG No results found for: VD25OH  No results found for: PREALBUMIN CBC EXTENDED Latest Ref Rng & Units 02/18/2019 04/26/2017 08/01/2016  WBC 4.0 - 10.5 K/uL 7.1 6.1 5.8  RBC 3.87 - 5.11 MIL/uL 4.55 4.30 4.38  HGB 12.0 - 15.0 g/dL 14.0 12.9 12.9  HCT 36.0 - 46.0 % 41.6 38.3 38.0  PLT 150 - 400 K/uL 263 227 263  NEUTROABS 1.7 - 7.7 K/uL - - -  LYMPHSABS 0.7 - 4.0 K/uL - - -     Body mass index is 23.91 kg/m.  Orders:  No orders of the defined types were placed in this encounter.  Meds ordered this encounter  Medications  . acetaminophen-codeine (TYLENOL #3) 300-30 MG tablet    Sig: Take 1 tablet by mouth 2 (two) times daily as needed for moderate pain.    Dispense:  14 tablet    Refill:  0     Procedures: No procedures performed  Clinical Data: No additional findings.  ROS:  All other systems negative, except as noted in the HPI. Review of Systems  Constitutional: Negative for chills and fever.  Musculoskeletal: Positive for joint swelling and myalgias.  Skin: Negative for color change and wound.    Objective: Vital Signs: Ht 5\' 3"  (1.6 m)   Wt 135 lb (61.2 kg)   BMI 23.91 kg/m   Specialty Comments:  No specialty comments available.  PMFS History: Patient Active Problem List   Diagnosis Date Noted  . 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 Subacute osteomyelitis, right ankle and foot ([place])   . Neuropathic pain of foot, right 09/02/2017  . Great toe amputation status, right 05/15/2017  . 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 Retained orthopedic hardware   . Nonunion of foot fracture, right 07/13/2016  . DECREASED HEARING 10/13/2010   . ALLERGIC RHINITIS 10/13/2010  . PULMONARY NODULE 10/13/2010  . NEOPLASM, BENIGN, THYROID 09/22/2010  . HYPERLIPIDEMIA 09/22/2010   Past Medical History:  Diagnosis Date  . Arthritis   . Asthma   . Constipation   . Deaf    left ear  . Difficult intubation    Patient reports that they had to use a small tube; MAC3, grade 1 view, 1 attempt, 7.0 ETT on 05/11/09  . GERD (gastroesophageal reflux disease)   . Hard of hearing    right  . Panic attack    Panic  . Pneumonia    years ago - recorded 02/16/2019  . Seizures ([place])    "due to Tramadol"  . Shortness of breath dyspnea    with exertion    History reviewed. No pertinent family history.  Past Surgical History:  Procedure Laterality Date  . ABDOMINAL HYSTERECTOMY    . AMPUTATION Right 04/26/2017   Procedure: Right Great Toe Amputation;  Surgeon: Newt Minion, MD;  Location: [facility];  Service: Orthopedics;  Laterality: Right;  . AMPUTATION Right 02/18/2019   Procedure: RIGHT FOOT 1ST RAY AMPUTATION;  Surgeon: Newt Minion, MD;  Location: [facility];  Service: Orthopedics;  Laterality: Right;  . APPENDECTOMY     as child  . ARTHRODESIS METATARSAL Right 08/01/2016   Procedure: Take Down Non Union, Remove Hardware, Revision Fusion Right Great Toe;  Surgeon: Newt Minion, MD;  Location: [facility];  Service: Orthopedics;  Laterality: Right;  . ARTHRODESIS METATARSALPHALANGEAL JOINT (MTPJ) Right 05/02/2016   Procedure: ARTHRODESIS METATARSALPHALANGEAL JOINT (MTPJ) RIGHT GREAT TOE;  Surgeon: Newt Minion, MD;  Location: [facility];  Service: Orthopedics;  Laterality: Right;  . CERVICAL FUSION    . CESAREAN SECTION     x 2  . ORIF CLAVICULAR FRACTURE Right 05/05/2013   Procedure: OPEN REDUCTION INTERNAL FIXATION (ORIF) CLAVICULAR FRACTURE- right;  Surgeon: Meredith Pel, MD;  Location: [facility];  Service: Orthopedics;  Laterality: Right;  Right Clavicle Fracture Non Union Takedown and Fixation with Bone Graft, Coracoclavicular Ligament  Reconstruction with Tight Rope.  Marland Kitchen SHOULDER SURGERY Right    x 3- fracture- screws,   . thumb operation Right   . TONSILLECTOMY AND ADENOIDECTOMY     as a child   Social History   Occupational History  . Not on file  Tobacco Use  . Smoking status: Current Every Day Smoker    Packs/day: 1.00    Years: 35.00    Pack years: 35.00  . Smokeless tobacco: Never Used  Substance and Sexual Activity  . Alcohol use: No  . Drug use: No  .  Sexual activity: Not on file

## 2019-04-22 ENCOUNTER — Encounter: Payer: Self-pay | Admitting: Orthopedic Surgery

## 2019-04-22 ENCOUNTER — Other Ambulatory Visit: Payer: Self-pay

## 2019-04-22 ENCOUNTER — Ambulatory Visit (INDEPENDENT_AMBULATORY_CARE_PROVIDER_SITE_OTHER): Payer: Medicaid Other | Admitting: Orthopedic Surgery

## 2019-04-22 DIAGNOSIS — M542 Cervicalgia: Secondary | ICD-10-CM

## 2019-04-23 ENCOUNTER — Ambulatory Visit: Payer: Medicaid Other | Admitting: Orthopedic Surgery

## 2019-04-24 ENCOUNTER — Encounter: Payer: Self-pay | Admitting: Orthopedic Surgery

## 2019-04-24 NOTE — Progress Notes (Signed)
Office Visit Note   Patient: Susan Short           Date of Birth: 13-Apr-1971           MRN: [id number] Visit Date: 04/22/2019 Requested by: Antonietta Jewel, MD [address] [place],  [place] [address] PCP: Antonietta Jewel, MD  Subjective: Chief Complaint  Patient presents with  . Left Shoulder - Pain    HPI: Susan Short is a 48 y.o. female who presents to the office complaining of left shoulder pain.  Patient returns to the office complaining of left shoulder pain.  Patient notes burning left trapezius and left shoulder pain.  She also notes neck pain.  Her pain is worse with her arm hanging down or when turning her head to her left side.  She also notes pain in her medial and lateral shoulder blade.  She notes numbness and tingling into her fingertips.  She also reports weakness of the left shoulder.  She is taking Tylenol 3 and tramadol with little relief in pain.  She has also tried a home exercise program with little relief.  She has never had an MRI of her neck or any neck surgery.              ROS:  All systems reviewed are negative as they relate to the chief complaint within the history of present illness.  Patient denies fevers or chills.  Assessment & Plan: Visit Diagnoses:  1. Cervicalgia     Plan: Patient is a 48 year old female who presents with left shoulder pain.  Impression is cervical spine disc herniation pathology.  She has burning quality pain in her left shoulder as well as the left trapezius and shoulder blade region.  She also notes numbness and tingling down her arm as well as weakness in the left arm.  Recommended MRI of the C-spine to evaluate for disc pathology.  Patient agreed with plan.  She will follow-up after MRI to review results.  Follow-Up Instructions: No follow-ups on file.   Orders:  Orders Placed This Encounter  Procedures  . MR Cervical Spine w/o contrast   No orders of the defined types were placed in this encounter.     Procedures: No procedures performed   Clinical Data: No additional findings.  Objective: Vital Signs: There were no vitals taken for this visit.  Physical Exam:  Constitutional: Patient appears well-developed HEENT:  Head: Normocephalic Eyes:EOM are normal Neck: Normal range of motion Cardiovascular: Normal rate Pulmonary/chest: Effort normal Neurologic: Patient is alert Skin: Skin is warm Psychiatric: Patient has normal mood and affect  Ortho Exam:  Left shoulder Exam Unable to fully forward flex and abduct shoulder overhead No loss of ER relative to the other shoulder.  Good endpoint with ER Mild TTP over the [facility] joint or bicipital groove 5/5 motor strength of the subscapularis, supraspinatus, and infraspinatus  Negative Hawkins impingement 4/5 grip strength, wrist extension/flexion, elbow flexion when compared to the contralateral side  Neck pain with neck range of motion Unable to look to her left without severe pain   Specialty Comments:  No specialty comments available.  Imaging: No results found.   PMFS History: Patient Active Problem List   Diagnosis Date Noted  . 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 Subacute osteomyelitis, right ankle and foot ([place])   . Neuropathic pain of foot, right 09/02/2017  . Great toe amputation status, right 05/15/2017  . Pseudarthrosis after  fusion or arthrodesis 12/12/2016  . Retained orthopedic hardware   . Nonunion of foot fracture, right 07/13/2016  . DECREASED HEARING 10/13/2010  . ALLERGIC RHINITIS 10/13/2010  . PULMONARY NODULE 10/13/2010  . NEOPLASM, BENIGN, THYROID 09/22/2010  . HYPERLIPIDEMIA 09/22/2010   Past Medical History:  Diagnosis Date  . Arthritis   . Asthma   . Constipation   . Deaf    left ear  . Difficult intubation    Patient reports that they had to use a small tube; MAC3, grade 1 view, 1 attempt, 7.0 ETT on 05/11/09  . GERD (gastroesophageal reflux  disease)   . Hard of hearing    right  . Panic attack    Panic  . Pneumonia    years ago - recorded 02/16/2019  . Seizures ([place])    "due to Tramadol"  . Shortness of breath dyspnea    with exertion    History reviewed. No pertinent family history.  Past Surgical History:  Procedure Laterality Date  . ABDOMINAL HYSTERECTOMY    . AMPUTATION Right 04/26/2017   Procedure: Right Great Toe Amputation;  Surgeon: Newt Minion, MD;  Location: [place];  Service: Orthopedics;  Laterality: Right;  . AMPUTATION Right 02/18/2019   Procedure: RIGHT FOOT 1ST RAY AMPUTATION;  Surgeon: Newt Minion, MD;  Location: [place];  Service: Orthopedics;  Laterality: Right;  . APPENDECTOMY     as child  . ARTHRODESIS METATARSAL Right 08/01/2016   Procedure: Take Down Non Union, Remove Hardware, Revision Fusion Right Great Toe;  Surgeon: Newt Minion, MD;  Location: [facility];  Service: Orthopedics;  Laterality: Right;  . ARTHRODESIS METATARSALPHALANGEAL JOINT (MTPJ) Right 05/02/2016   Procedure: ARTHRODESIS METATARSALPHALANGEAL JOINT (MTPJ) RIGHT GREAT TOE;  Surgeon: Newt Minion, MD;  Location: [facility];  Service: Orthopedics;  Laterality: Right;  . CERVICAL FUSION    . CESAREAN SECTION     x 2  . ORIF CLAVICULAR FRACTURE Right 05/05/2013   Procedure: OPEN REDUCTION INTERNAL FIXATION (ORIF) CLAVICULAR FRACTURE- right;  Surgeon: Meredith Pel, MD;  Location: [facility];  Service: Orthopedics;  Laterality: Right;  Right Clavicle Fracture Non Union Takedown and Fixation with Bone Graft, Coracoclavicular Ligament Reconstruction with Tight Rope.  Marland Kitchen SHOULDER SURGERY Right    x 3- fracture- screws,   . thumb operation Right   . TONSILLECTOMY AND ADENOIDECTOMY     as a child   Social History   Occupational History  . Not on file  Tobacco Use  . Smoking status: Current Every Day Smoker    Packs/day: 1.00    Years: 35.00    Pack years: 35.00  . Smokeless tobacco: Never Used  Substance and Sexual Activity  . Alcohol  use: No  . Drug use: No  . Sexual activity: Not on file

## 2019-04-30 ENCOUNTER — Other Ambulatory Visit: Payer: Self-pay | Admitting: Orthopedic Surgery

## 2019-05-04 ENCOUNTER — Ambulatory Visit (INDEPENDENT_AMBULATORY_CARE_PROVIDER_SITE_OTHER): Payer: Medicaid Other | Admitting: Orthopedic Surgery

## 2019-05-04 ENCOUNTER — Encounter: Payer: Self-pay | Admitting: Orthopedic Surgery

## 2019-05-04 VITALS — Ht 63.0 in | Wt 135.0 lb

## 2019-05-04 DIAGNOSIS — M79671 Pain in right foot: Secondary | ICD-10-CM

## 2019-05-04 MED ORDER — TRAMADOL HCL 50 MG PO TABS: 50.0000 mg | ORAL_TABLET | Freq: Four times a day (QID) | ORAL | 0 refills | Status: DC | PRN

## 2019-05-04 MED ORDER — PREGABALIN 100 MG PO CAPS: 100.0000 mg | ORAL_CAPSULE | Freq: Two times a day (BID) | ORAL | 1 refills | Status: DC

## 2019-05-04 MED ORDER — ZOLPIDEM TARTRATE ER 6.25 MG PO TBCR: 6.2500 mg | EXTENDED_RELEASE_TABLET | Freq: Every evening | ORAL | 0 refills | Status: DC | PRN

## 2019-05-04 NOTE — Progress Notes (Signed)
Office Visit Note   Patient: Susan Short           Date of Birth: 1971/05/30           MRN: [id number] Visit Date: 05/04/2019              Requested by: Antonietta Jewel, MD [address] [place],  [place] [address] PCP: Antonietta Jewel, MD  Chief Complaint  Patient presents with  . Right Foot - Routine Post Op    02/18/19 right foot 1st ray amputation       HPI: Patient is a 48 year old woman who presents complaining of right foot pain she is status post a first ray amputation.  She is weightbearing in crocs.  She complains of global shooting pain in her foot and toes.  Patient is concerned with the prominence of the medial column of the medial cuneiform.  She is also concerned with the second toe.  Patient states that she has chronic pain she requests Soma Flexeril and Percocet.  Patient states she feels like if she had surgery on the second toe and if she had surgery on the medial cuneiform her pain would go away.  Patient states she is still smoking the importance of smoking cessation was discussed.  Assessment & Plan: Visit Diagnoses:  1. Pain in right foot     Plan: Reviewed again the importance of a stiff soled sneaker to unload the toe and medial column of her foot that this will help get rid of some of her pain discussed that being barefoot and wearing crocs or not helping her feet.  Again reviewed smoking cessation.  Discussed that surgical intervention to shave down the bone of the medial cuneiform or to do any type of surgery to the second toe would not relieve her neuropathic pain.  Patient has been taking Lyrica and gabapentin at the same time and recommend that she stop the gabapentin.  Recommended following up with her primary care physician for her multiple medical problems.   Discussed that we will call in one last prescription for the Ambien Ultram and Lyrica. Follow-Up Instructions: Return if symptoms worsen or fail to improve.   Ortho Exam  Patient is  alert, oriented, no adenopathy, well-dressed, normal affect, normal respiratory effort. Examination of the right foot there is no redness no swelling no open wounds no signs of infection the second toe is straight there is no clawing there is no sausage digit swelling there is no callus no ulcers.  She does have some prominence of the medial cuneiform but there is no redness no cellulitis no swelling she has no dystrophy changes no hypersensitivity to light touch.  There are no open wounds her foot is plantigrade.  Imaging: No results found. No images are attached to the encounter.  Labs: Lab Results  Component Value Date   LABURIC 5.5 01/13/2018     Lab Results  Component Value Date   ALBUMIN 4.7 01/03/2012   LABURIC 5.5 01/13/2018    No results found for: MG No results found for: VD25OH  No results found for: PREALBUMIN CBC EXTENDED Latest Ref Rng & Units 02/18/2019 04/26/2017 08/01/2016  WBC 4.0 - 10.5 K/uL 7.1 6.1 5.8  RBC 3.87 - 5.11 MIL/uL 4.55 4.30 4.38  HGB 12.0 - 15.0 g/dL 14.0 12.9 12.9  HCT 36.0 - 46.0 % 41.6 38.3 38.0  PLT 150 - 400 K/uL 263 227 263  NEUTROABS 1.7 - 7.7 K/uL - - -  LYMPHSABS 0.7 -  4.0 K/uL - - -     Body mass index is 23.91 kg/m.  Orders:  No orders of the defined types were placed in this encounter.  Meds ordered this encounter  Medications  . traMADol (ULTRAM) 50 MG tablet    Sig: Take 1 tablet (50 mg total) by mouth every 6 (six) hours as needed for moderate pain.    Dispense:  20 tablet    Refill:  0  . pregabalin (LYRICA) 100 MG capsule    Sig: Take 1 capsule (100 mg total) by mouth 2 (two) times daily.    Dispense:  60 capsule    Refill:  1  . zolpidem (AMBIEN CR) 6.25 MG CR tablet    Sig: Take 1 tablet (6.25 mg total) by mouth at bedtime as needed for sleep.    Dispense:  20 tablet    Refill:  0     Procedures: No procedures performed  Clinical Data: No additional findings.  ROS:  All other systems negative, except as  noted in the HPI. Review of Systems  Objective: Vital Signs: Ht 5\' 3"  (1.6 m)   Wt 135 lb (61.2 kg)   BMI 23.91 kg/m   Specialty Comments:  No specialty comments available.  PMFS History: Patient Active Problem List   Diagnosis Date Noted  . 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 Subacute osteomyelitis, right ankle and foot ([place])   . Neuropathic pain of foot, right 09/02/2017  . Great toe amputation status, right 05/15/2017  . 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 Retained orthopedic hardware   . Nonunion of foot fracture, right 07/13/2016  . DECREASED HEARING 10/13/2010  . ALLERGIC RHINITIS 10/13/2010  . PULMONARY NODULE 10/13/2010  . NEOPLASM, BENIGN, THYROID 09/22/2010  . HYPERLIPIDEMIA 09/22/2010   Past Medical History:  Diagnosis Date  . Arthritis   . Asthma   . Constipation   . Deaf    left ear  . Difficult intubation    Patient reports that they had to use a small tube; MAC3, grade 1 view, 1 attempt, 7.0 ETT on 05/11/09  . GERD (gastroesophageal reflux disease)   . Hard of hearing    right  . Panic attack    Panic  . Pneumonia    years ago - recorded 02/16/2019  . Seizures ([place])    "due to Tramadol"  . Shortness of breath dyspnea    with exertion    History reviewed. No pertinent family history.  Past Surgical History:  Procedure Laterality Date  . ABDOMINAL HYSTERECTOMY    . AMPUTATION Right 04/26/2017   Procedure: Right Great Toe Amputation;  Surgeon: Newt Minion, MD;  Location: [facility];  Service: Orthopedics;  Laterality: Right;  . AMPUTATION Right 02/18/2019   Procedure: RIGHT FOOT 1ST RAY AMPUTATION;  Surgeon: Newt Minion, MD;  Location: [facility];  Service: Orthopedics;  Laterality: Right;  . APPENDECTOMY     as child  . ARTHRODESIS METATARSAL Right 08/01/2016   Procedure: Take Down Non Union, Remove Hardware, Revision Fusion Right Great Toe;  Surgeon: Newt Minion, MD;  Location: [facility];   Service: Orthopedics;  Laterality: Right;  . ARTHRODESIS METATARSALPHALANGEAL JOINT (MTPJ) Right 05/02/2016   Procedure: ARTHRODESIS METATARSALPHALANGEAL JOINT (MTPJ) RIGHT GREAT TOE;  Surgeon: Newt Minion, MD;  Location: [facility];  Service: Orthopedics;  Laterality: Right;  . CERVICAL FUSION    . CESAREAN SECTION     x  2  . ORIF CLAVICULAR FRACTURE Right 05/05/2013   Procedure: OPEN REDUCTION INTERNAL FIXATION (ORIF) CLAVICULAR FRACTURE- right;  Surgeon: Meredith Pel, MD;  Location: [facility];  Service: Orthopedics;  Laterality: Right;  Right Clavicle Fracture Non Union Takedown and Fixation with Bone Graft, Coracoclavicular Ligament Reconstruction with Tight Rope.  Marland Kitchen SHOULDER SURGERY Right    x 3- fracture- screws,   . thumb operation Right   . TONSILLECTOMY AND ADENOIDECTOMY     as a child   Social History   Occupational History  . Not on file  Tobacco Use  . Smoking status: Current Every Day Smoker    Packs/day: 1.00    Years: 35.00    Pack years: 35.00  . Smokeless tobacco: Never Used  Substance and Sexual Activity  . Alcohol use: No  . Drug use: No  . Sexual activity: Not on file

## 2019-05-26 ENCOUNTER — Telehealth: Payer: Self-pay | Admitting: Orthopedic Surgery

## 2019-05-26 ENCOUNTER — Ambulatory Visit (INDEPENDENT_AMBULATORY_CARE_PROVIDER_SITE_OTHER): Payer: Medicaid Other | Admitting: Orthopedic Surgery

## 2019-05-26 ENCOUNTER — Ambulatory Visit (INDEPENDENT_AMBULATORY_CARE_PROVIDER_SITE_OTHER): Payer: Medicaid Other

## 2019-05-26 ENCOUNTER — Other Ambulatory Visit: Payer: Self-pay

## 2019-05-26 VITALS — Ht 63.0 in | Wt 131.0 lb

## 2019-05-26 DIAGNOSIS — M79671 Pain in right foot: Secondary | ICD-10-CM | POA: Diagnosis not present

## 2019-05-26 NOTE — Telephone Encounter (Signed)
Patient's mom called. Says patient is not to have Tramadol. Her call back number is [phone]

## 2019-05-26 NOTE — Telephone Encounter (Signed)
See message below °

## 2019-05-27 NOTE — Telephone Encounter (Signed)
Ok, please send to others as well.  Looks like she has seen everyone in office except [place]

## 2019-05-28 NOTE — Telephone Encounter (Signed)
FYI- Since she has seen Hilarie Fredrickson.Marland KitchenMarland Kitchen

## 2019-05-29 ENCOUNTER — Other Ambulatory Visit: Payer: Self-pay | Admitting: Family

## 2019-05-29 NOTE — Telephone Encounter (Signed)
Erin please advise.

## 2019-06-10 ENCOUNTER — Encounter: Payer: Self-pay | Admitting: Orthopedic Surgery

## 2019-06-10 NOTE — Progress Notes (Signed)
Office Visit Note   Patient: Susan Short           Date of Birth: Apr 13, 1971           MRN: [id number] Visit Date: 05/26/2019              Requested by: Antonietta Jewel, MD [address] [place],  [place] [address] PCP: Antonietta Jewel, MD  Chief Complaint  Patient presents with  . Right Foot - Pain      HPI: Patient is a 48 year old woman who states she fell about a week ago injuring the toes on her right foot.  She complains of swelling and bruising across the middle of her toes she states she is not able to bend her toes well she has been using over-the-counter anti-inflammatory medicines.  Assessment & Plan: Visit Diagnoses:  1. Pain in right foot     Plan: Recommended patient use a cane to prevent risk of falling.  Patient has had pain medicines refilled 2 weeks ago.  Recommended custom orthotics to better support the foot a prescription for [employer]otech was provided.  Recommended melatonin to help with her sleep.  Recommended stiff soled supportive sneakers to further unload the forefoot.  Follow-Up Instructions: No follow-ups on file.   Ortho Exam  Patient is alert, oriented, no adenopathy, well-dressed, normal affect, normal respiratory effort. Examination patient has no ecchymosis bruising or swelling in the right foot she has good ankle good subtalar motion she does have clawing of the toes there are no ulcers no signs of infection no signs of gout.  Imaging: No results found. No images are attached to the encounter.  Labs: Lab Results  Component Value Date   LABURIC 5.5 01/13/2018     Lab Results  Component Value Date   ALBUMIN 4.7 01/03/2012   LABURIC 5.5 01/13/2018    No results found for: MG No results found for: VD25OH  No results found for: PREALBUMIN CBC EXTENDED Latest Ref Rng & Units 02/18/2019 04/26/2017 08/01/2016  WBC 4.0 - 10.5 K/uL 7.1 6.1 5.8  RBC 3.87 - 5.11 MIL/uL 4.55 4.30 4.38  HGB 12.0 - 15.0 g/dL 14.0 12.9 12.9  HCT 36.0 - 46.0 %  41.6 38.3 38.0  PLT 150 - 400 K/uL 263 227 263  NEUTROABS 1.7 - 7.7 K/uL - - -  LYMPHSABS 0.7 - 4.0 K/uL - - -     Body mass index is 23.21 kg/m.  Orders:  Orders Placed This Encounter  Procedures  . XR Foot 2 Views Right   No orders of the defined types were placed in this encounter.    Procedures: No procedures performed  Clinical Data: No additional findings.  ROS:  All other systems negative, except as noted in the HPI. Review of Systems  Objective: Vital Signs: Ht 5\' 3"  (1.6 m)   Wt 131 lb (59.4 kg)   BMI 23.21 kg/m   Specialty Comments:  No specialty comments available.  PMFS History: Patient Active Problem List   Diagnosis Date Noted  . 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 Subacute osteomyelitis, right ankle and foot ([place])   . Neuropathic pain of foot, right 09/02/2017  . Great toe amputation status, right 05/15/2017  . 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 Retained orthopedic hardware   . Nonunion of foot fracture, right 07/13/2016  . DECREASED HEARING 10/13/2010  . ALLERGIC RHINITIS 10/13/2010  . PULMONARY NODULE 10/13/2010  .  NEOPLASM, BENIGN, THYROID 09/22/2010  . HYPERLIPIDEMIA 09/22/2010   Past Medical History:  Diagnosis Date  . Arthritis   . Asthma   . Constipation   . Deaf    left ear  . Difficult intubation    Patient reports that they had to use a small tube; MAC3, grade 1 view, 1 attempt, 7.0 ETT on 05/11/09  . GERD (gastroesophageal reflux disease)   . Hard of hearing    right  . Panic attack    Panic  . Pneumonia    years ago - recorded 02/16/2019  . Seizures ([place])    "due to Tramadol"  . Shortness of breath dyspnea    with exertion    History reviewed. No pertinent family history.  Past Surgical History:  Procedure Laterality Date  . ABDOMINAL HYSTERECTOMY    . AMPUTATION Right 04/26/2017   Procedure: Right Great Toe Amputation;  Surgeon: Newt Minion, MD;   Location: [facility];  Service: Orthopedics;  Laterality: Right;  . AMPUTATION Right 02/18/2019   Procedure: RIGHT FOOT 1ST RAY AMPUTATION;  Surgeon: Newt Minion, MD;  Location: [facility];  Service: Orthopedics;  Laterality: Right;  . APPENDECTOMY     as child  . ARTHRODESIS METATARSAL Right 08/01/2016   Procedure: Take Down Non Union, Remove Hardware, Revision Fusion Right Great Toe;  Surgeon: Newt Minion, MD;  Location: [facility];  Service: Orthopedics;  Laterality: Right;  . ARTHRODESIS METATARSALPHALANGEAL JOINT (MTPJ) Right 05/02/2016   Procedure: ARTHRODESIS METATARSALPHALANGEAL JOINT (MTPJ) RIGHT GREAT TOE;  Surgeon: Newt Minion, MD;  Location: [facility];  Service: Orthopedics;  Laterality: Right;  . CERVICAL FUSION    . CESAREAN SECTION     x 2  . ORIF CLAVICULAR FRACTURE Right 05/05/2013   Procedure: OPEN REDUCTION INTERNAL FIXATION (ORIF) CLAVICULAR FRACTURE- right;  Surgeon: Meredith Pel, MD;  Location: [facility];  Service: Orthopedics;  Laterality: Right;  Right Clavicle Fracture Non Union Takedown and Fixation with Bone Graft, Coracoclavicular Ligament Reconstruction with Tight Rope.  Marland Kitchen SHOULDER SURGERY Right    x 3- fracture- screws,   . thumb operation Right   . TONSILLECTOMY AND ADENOIDECTOMY     as a child   Social History   Occupational History  . Not on file  Tobacco Use  . Smoking status: Current Every Day Smoker    Packs/day: 1.00    Years: 35.00    Pack years: 35.00  . Smokeless tobacco: Never Used  Substance and Sexual Activity  . Alcohol use: No  . Drug use: No  . Sexual activity: Not on file

## 2019-07-16 ENCOUNTER — Ambulatory Visit: Payer: Medicaid Other | Admitting: Orthopedic Surgery

## 2019-07-21 ENCOUNTER — Ambulatory Visit (INDEPENDENT_AMBULATORY_CARE_PROVIDER_SITE_OTHER): Payer: Medicaid Other

## 2019-07-21 ENCOUNTER — Ambulatory Visit (INDEPENDENT_AMBULATORY_CARE_PROVIDER_SITE_OTHER): Payer: Medicaid Other | Admitting: Orthopaedic Surgery

## 2019-07-21 ENCOUNTER — Other Ambulatory Visit: Payer: Self-pay

## 2019-07-21 ENCOUNTER — Encounter: Payer: Self-pay | Admitting: Orthopaedic Surgery

## 2019-07-21 VITALS — BP 109/65 | HR 74 | Ht 63.0 in

## 2019-07-21 DIAGNOSIS — R0781 Pleurodynia: Secondary | ICD-10-CM | POA: Diagnosis not present

## 2019-07-21 DIAGNOSIS — M542 Cervicalgia: Secondary | ICD-10-CM | POA: Diagnosis not present

## 2019-07-21 MED ORDER — TRAMADOL HCL 50 MG PO TABS: 50.0000 mg | ORAL_TABLET | Freq: Four times a day (QID) | ORAL | 0 refills | Status: DC | PRN
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
## 2019-07-28 ENCOUNTER — Other Ambulatory Visit: Payer: Self-pay | Admitting: Orthopedic Surgery

## 2019-07-28 ENCOUNTER — Other Ambulatory Visit: Payer: Self-pay | Admitting: Orthopaedic Surgery

## 2019-07-28 MED ORDER — TRAMADOL HCL 50 MG PO TABS: 50.0000 mg | ORAL_TABLET | Freq: Two times a day (BID) | ORAL | 0 refills | Status: DC | PRN

## 2019-07-28 NOTE — Telephone Encounter (Signed)
Ucall. I sent it in . thanks

## 2019-07-28 NOTE — Telephone Encounter (Signed)
Please advise 

## 2019-08-11 ENCOUNTER — Other Ambulatory Visit: Payer: Self-pay | Admitting: Orthopaedic Surgery

## 2019-08-11 DIAGNOSIS — M542 Cervicalgia: Secondary | ICD-10-CM

## 2019-08-11 NOTE — Telephone Encounter (Signed)
Denied. Needs MRI scan done . No pain meds until ROV after MRI scan.   [facility] website reviewed, sorry unable at this time to give her any more until after MRI.

## 2019-08-11 NOTE — Telephone Encounter (Signed)
Please advise 

## 2019-08-12 ENCOUNTER — Other Ambulatory Visit: Payer: Self-pay | Admitting: Orthopaedic Surgery

## 2019-08-12 NOTE — Telephone Encounter (Signed)
FYI MRI order was not submitted when patient was last seen in office. I entered order today. Just wanted you to be aware.

## 2019-08-17 ENCOUNTER — Other Ambulatory Visit: Payer: Self-pay

## 2019-08-17 ENCOUNTER — Telehealth: Payer: Self-pay | Admitting: Orthopaedic Surgery

## 2019-08-17 MED ORDER — TRAMADOL HCL 50 MG PO TABS: 50.0000 mg | ORAL_TABLET | Freq: Four times a day (QID) | ORAL | 0 refills | Status: DC | PRN

## 2019-08-17 NOTE — Telephone Encounter (Signed)
Patient's mom called. She would like a refill on her Tramadol called in. Her call back number is [phone]

## 2019-08-17 NOTE — Telephone Encounter (Signed)
Please advise 

## 2019-08-17 NOTE — Telephone Encounter (Signed)
Ok call in refill tramadol thanks

## 2019-08-17 NOTE — Telephone Encounter (Signed)
Called into pharmacy

## 2019-08-25 ENCOUNTER — Other Ambulatory Visit: Payer: Self-pay | Admitting: Orthopaedic Surgery

## 2019-08-25 NOTE — Telephone Encounter (Signed)
OK let her know this is last refill of tramadol . Needs to get scan and decide if surgery needed. Not good to keep taking this and then pain meds after surgery if surgery is needed will not work well

## 2019-08-25 NOTE — Telephone Encounter (Signed)
Please advise 

## 2019-08-25 NOTE — Telephone Encounter (Signed)
I called refill to pharmacy. I left voicemail explaining to patient this would be her last refill. I did advise, per notes in Epic, messages have been left for return call so that she can schedule MRI. I advised on voicemail that she needed to call to get  MRI scheduled and then schedule follow up appt in our office to review scan with Dr. [name].

## 2019-09-01 ENCOUNTER — Telehealth: Payer: Self-pay | Admitting: Radiology

## 2019-09-01 MED ORDER — TRAMADOL HCL 50 MG PO TABS: 50.0000 mg | ORAL_TABLET | Freq: Four times a day (QID) | ORAL | 0 refills | Status: DC | PRN

## 2019-09-01 NOTE — Telephone Encounter (Signed)
Refill info called to pharmacy.

## 2019-09-01 NOTE — Telephone Encounter (Signed)
Ok refill ultram.  Check on progress of MRI request thanks

## 2019-09-01 NOTE — Telephone Encounter (Signed)
I attempted to reach patient with no answer no voicemail. I then attempted to reach patient's mother and no voicemail is set up.  If patient calls office, she will need to call and schedule MRI (per chart, [facility] has not been able to reach her) and schedule follow up to review in our office. Per Dr. [name], this will be last refill until patient has MRI.

## 2019-09-01 NOTE — Telephone Encounter (Signed)
Received fax from pharmacy requesting refill on Tramadol 50mg  tablet 1 po q 6 hours prn #20.  Last filled 08/25/2019.  Please advise. OK to refill?

## 2019-09-01 NOTE — Addendum Note (Signed)
Addended by: Meyer Cory on: 09/01/2019 04:44 PM   Modules accepted: Orders

## 2019-09-04 ENCOUNTER — Encounter: Payer: Self-pay | Admitting: Radiology

## 2019-09-07 ENCOUNTER — Telehealth: Payer: Self-pay | Admitting: Orthopaedic Surgery

## 2019-09-07 ENCOUNTER — Other Ambulatory Visit: Payer: Self-pay | Admitting: Orthopaedic Surgery

## 2019-09-07 NOTE — Telephone Encounter (Signed)
Patient's mother called.   The patient needs a refill on her Tramadol. She is also expressing pain in the area she received surgery and would like to speak with her provider about it.   Call back number: [phone]

## 2019-09-07 NOTE — Telephone Encounter (Signed)
I left voicemail for [facility]. Patient has been seen in the office most recently by Dr. [name] and MRI Cervical Spine was ordered. [facility] has tried to reach patient to schedule, but has been unable to reach her. I explained in the message that she would need to call them to schedule.  In regards to surgical site pain, patient was seen previously by [name] with surgery on her foot. I asked for [name] to return call to let us know what body part she is experiencing pain in so that I can get this message to the correct provider.    Awaiting return call.

## 2019-09-08 NOTE — Telephone Encounter (Signed)
FYI.  I have tried on numerous occasions to reach patient and her mother.  I am not sure whether the pain she is experiencing is in the foot [name] operated on. Just wanted for you to be aware in case they returned call to you instead of me.

## 2019-09-09 ENCOUNTER — Telehealth: Payer: Self-pay | Admitting: Orthopaedic Surgery

## 2019-09-09 NOTE — Telephone Encounter (Signed)
Patient's mother called.   Their pharmacy is saying they still have not received her refill for Tramadol.   Call back number: [phone]

## 2019-09-09 NOTE — Telephone Encounter (Signed)
Order has been faxed to [facility] MRI

## 2019-09-09 NOTE — Telephone Encounter (Signed)
Pt is scehduled at [facility] on Friday Jan 29th at 3:00pm, pt mom Susan Short aware of appt.

## 2019-09-09 NOTE — Telephone Encounter (Signed)
I called and spoke with patient's mother, [name], who has called requesting refill on tramadol x 2 today.  I explained that Dr. [name] will not refill tramadol until MRI Cervical Spine is completed and that [facility] has left messages for patient to call and schedule.    Sabrina--pt's mom requests we change location to somewhere in [place]. Can you do that please? Thanks.  Autumn--pt's mom says that she is complaining of pain in her foot and swelling in her toe. She is requesting the tramadol for both foot pain and neck pain. I explained that [name] may need to see patient back in the office.  They would like to know what [name] wants her to do in regards to foot pain.

## 2019-09-09 NOTE — Telephone Encounter (Signed)
Duplicate message in chart.  

## 2019-09-09 NOTE — Telephone Encounter (Signed)
noted 

## 2019-09-09 NOTE — Telephone Encounter (Signed)
Made an appt for tomorrow at 10:30

## 2019-09-09 NOTE — Telephone Encounter (Signed)
Patient's mother called.   They need a refill on the patients Tramadol.   Call back number: [phone]

## 2019-09-10 ENCOUNTER — Other Ambulatory Visit: Payer: Self-pay

## 2019-09-10 ENCOUNTER — Encounter: Payer: Self-pay | Admitting: Physician Assistant

## 2019-09-10 ENCOUNTER — Ambulatory Visit (INDEPENDENT_AMBULATORY_CARE_PROVIDER_SITE_OTHER): Payer: Medicaid Other | Admitting: Orthopedic Surgery

## 2019-09-10 VITALS — Ht 63.0 in | Wt 131.0 lb

## 2019-09-10 DIAGNOSIS — M79671 Pain in right foot: Secondary | ICD-10-CM

## 2019-09-10 MED ORDER — PREGABALIN 100 MG PO CAPS: 100.0000 mg | ORAL_CAPSULE | Freq: Two times a day (BID) | ORAL | 1 refills | Status: DC

## 2019-09-10 MED ORDER — TRAMADOL HCL 50 MG PO TABS: 50.0000 mg | ORAL_TABLET | Freq: Four times a day (QID) | ORAL | 0 refills | Status: DC | PRN

## 2019-09-11 ENCOUNTER — Telehealth: Payer: Self-pay | Admitting: Orthopaedic Surgery

## 2019-09-11 NOTE — Telephone Encounter (Signed)
Patient's mother called.   She needs a refill on the patient's tramadol   Call back number: [phone]

## 2019-09-11 NOTE — Telephone Encounter (Signed)
I called denied. Until surgery. [facility] website reviewed.

## 2019-09-11 NOTE — Telephone Encounter (Signed)
Please advise 

## 2019-09-14 ENCOUNTER — Encounter: Payer: Self-pay | Admitting: Orthopedic Surgery

## 2019-09-14 NOTE — Progress Notes (Signed)
Office Visit Note   Patient: Susan Short           Date of Birth: Feb 07, 1971           MRN: [id number] Visit Date: 09/10/2019              Requested by: Antonietta Jewel, MD [address] [place],  [place] [address] PCP: Antonietta Jewel, MD  Chief Complaint  Patient presents with  . Right Foot - Follow-up, Pain      HPI: Patient is a 49 year old woman who was seen in follow-up for right foot pain.  She is status post a right foot first ray amputation and July 2020.  Patient complains of swelling in the third toe with throbbing pain radiating up her leg.  She states she is currently using Voltaren gel.  Assessment & Plan: Visit Diagnoses:  1. Pain in right foot     Plan: A prescription was called in for tramadol as well as Lyrica.  She was given a prescription for biotech for extra-depth shoes and custom orthotics.  Follow-Up Instructions: Return if symptoms worsen or fail to improve.   Ortho Exam  Patient is alert, oriented, no adenopathy, well-dressed, normal affect, normal respiratory effort. Examination has callus beneath the fifth metatarsal head as well as on the second and third toe there is no cellulitis no ulcers no signs of infection patient is globally tender to palpation.  She does complain of chronic neuropathic pain and has been on both Neurontin and Lyrica in the past.  Imaging: No results found. No images are attached to the encounter.  Labs: Lab Results  Component Value Date   LABURIC 5.5 01/13/2018     Lab Results  Component Value Date   ALBUMIN 4.7 01/03/2012   LABURIC 5.5 01/13/2018    No results found for: MG No results found for: VD25OH  No results found for: PREALBUMIN CBC EXTENDED Latest Ref Rng & Units 02/18/2019 04/26/2017 08/01/2016  WBC 4.0 - 10.5 K/uL 7.1 6.1 5.8  RBC 3.87 - 5.11 MIL/uL 4.55 4.30 4.38  HGB 12.0 - 15.0 g/dL 14.0 12.9 12.9  HCT 36.0 - 46.0 % 41.6 38.3 38.0  PLT 150 - 400 K/uL 263 227 263  NEUTROABS 1.7 - 7.7  K/uL - - -  LYMPHSABS 0.7 - 4.0 K/uL - - -     Body mass index is 23.21 kg/m.  Orders:  No orders of the defined types were placed in this encounter.  Meds ordered this encounter  Medications  . pregabalin (LYRICA) 100 MG capsule    Sig: Take 1 capsule (100 mg total) by mouth 2 (two) times daily.    Dispense:  60 capsule    Refill:  1  . traMADol (ULTRAM) 50 MG tablet    Sig: Take 1 tablet (50 mg total) by mouth every 6 (six) hours as needed. for pain    Dispense:  20 tablet    Refill:  0    Not to exceed 4 additional fills before 02/13/2020.     Procedures: No procedures performed  Clinical Data: No additional findings.  ROS:  All other systems negative, except as noted in the HPI. Review of Systems  Objective: Vital Signs: Ht 5\' 3"  (1.6 m)   Wt 131 lb (59.4 kg)   BMI 23.21 kg/m   Specialty Comments:  No specialty comments available.  PMFS History: Patient Active Problem List   Diagnosis Date Noted  . History of partial ray amputation of  first toe of right foot ([facility]) 03/11/2019  . Subacute osteomyelitis, right ankle and foot ([facility])   . Neuropathic pain of foot, right 09/02/2017  . Great toe amputation status, right 05/15/2017  . 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 Retained orthopedic hardware   . Nonunion of foot fracture, right 07/13/2016  . DECREASED HEARING 10/13/2010  . ALLERGIC RHINITIS 10/13/2010  . PULMONARY NODULE 10/13/2010  . NEOPLASM, BENIGN, THYROID 09/22/2010  . HYPERLIPIDEMIA 09/22/2010   Past Medical History:  Diagnosis Date  . Arthritis   . Asthma   . Constipation   . Deaf    left ear  . Difficult intubation    Patient reports that they had to use a small tube; MAC3, grade 1 view, 1 attempt, 7.0 ETT on 05/11/09  . GERD (gastroesophageal reflux disease)   . Hard of hearing    right  . Panic attack    Panic  . Pneumonia    years ago - recorded 02/16/2019  . Seizures ([facility])    "due to Tramadol"  . Shortness of  breath dyspnea    with exertion    History reviewed. No pertinent family history.  Past Surgical History:  Procedure Laterality Date  . ABDOMINAL HYSTERECTOMY    . AMPUTATION Right 04/26/2017   Procedure: Right Great Toe Amputation;  Surgeon: Newt Minion, MD;  Location: [facility];  Service: Orthopedics;  Laterality: Right;  . AMPUTATION Right 02/18/2019   Procedure: RIGHT FOOT 1ST RAY AMPUTATION;  Surgeon: Newt Minion, MD;  Location: [facility];  Service: Orthopedics;  Laterality: Right;  . APPENDECTOMY     as child  . ARTHRODESIS METATARSAL Right 08/01/2016   Procedure: Take Down Non Union, Remove Hardware, Revision Fusion Right Great Toe;  Surgeon: Newt Minion, MD;  Location: [facility];  Service: Orthopedics;  Laterality: Right;  . ARTHRODESIS METATARSALPHALANGEAL JOINT (MTPJ) Right 05/02/2016   Procedure: ARTHRODESIS METATARSALPHALANGEAL JOINT (MTPJ) RIGHT GREAT TOE;  Surgeon: Newt Minion, MD;  Location: [facility];  Service: Orthopedics;  Laterality: Right;  . CERVICAL FUSION    . CESAREAN SECTION     x 2  . ORIF CLAVICULAR FRACTURE Right 05/05/2013   Procedure: OPEN REDUCTION INTERNAL FIXATION (ORIF) CLAVICULAR FRACTURE- right;  Surgeon: Meredith Pel, MD;  Location: [facility];  Service: Orthopedics;  Laterality: Right;  Right Clavicle Fracture Non Union Takedown and Fixation with Bone Graft, Coracoclavicular Ligament Reconstruction with Tight Rope.  [facility] SHOULDER SURGERY Right    x 3- fracture- screws,   . thumb operation Right   . TONSILLECTOMY AND ADENOIDECTOMY     as a child   Social History   Occupational History  . Not on file  Tobacco Use  . Smoking status: Current Every Day Smoker    Packs/day: 1.00    Years: 35.00    Pack years: 35.00  . Smokeless tobacco: Never Used  Substance and Sexual Activity  . Alcohol use: No  . Drug use: No  . Sexual activity: Not on file

## 2019-09-15 ENCOUNTER — Encounter: Payer: Self-pay | Admitting: Orthopaedic Surgery

## 2019-09-15 ENCOUNTER — Other Ambulatory Visit: Payer: Self-pay

## 2019-09-15 ENCOUNTER — Ambulatory Visit (INDEPENDENT_AMBULATORY_CARE_PROVIDER_SITE_OTHER): Payer: Medicaid Other | Admitting: Orthopaedic Surgery

## 2019-09-15 DIAGNOSIS — M4802 Spinal stenosis, cervical region: Secondary | ICD-10-CM | POA: Diagnosis not present

## 2019-09-15 DIAGNOSIS — Z981 Arthrodesis status: Secondary | ICD-10-CM | POA: Diagnosis not present

## 2019-09-15 MED ORDER — TRAMADOL HCL 50 MG PO TABS: 50.0000 mg | ORAL_TABLET | Freq: Four times a day (QID) | ORAL | 0 refills | Status: DC | PRN
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
## 2019-09-16 ENCOUNTER — Encounter: Payer: Self-pay | Admitting: Orthopaedic Surgery

## 2019-09-16 DIAGNOSIS — Z981 Arthrodesis status: Secondary | ICD-10-CM | POA: Insufficient documentation

## 2019-09-16 DIAGNOSIS — M4802 Spinal stenosis, cervical region: Secondary | ICD-10-CM | POA: Insufficient documentation

## 2019-09-21 ENCOUNTER — Telehealth: Payer: Self-pay | Admitting: Orthopaedic Surgery

## 2019-09-21 NOTE — Telephone Encounter (Signed)
I called discussed with her Mom. Pt taking more frequent than prescribed. # 30 tabs and called after 4 days. She will have to use tylenol, aspircreme etc.  No more pain meds until surgery. Approval pending. Mother states she agrees and notes she has taking more pain meds than she should in the past. FYI

## 2019-09-21 NOTE — Telephone Encounter (Signed)
Please advise 

## 2019-09-21 NOTE — Telephone Encounter (Signed)
noted 

## 2019-09-21 NOTE — Telephone Encounter (Signed)
Patient had neighbor call and states that she thought you called her. She requests you call her mother at number listed below. Thanks.

## 2019-09-21 NOTE — Telephone Encounter (Signed)
Patient mother Susan Short called requesting a refill on her daughter's tramadol medication. Please send to pharmacy on file. Patient's mother phone number is [phone]

## 2019-09-21 NOTE — Telephone Encounter (Signed)
The number to contact [name] is [phone]      Cell phone number was called per patient's friend   Abigail Butts) next door neighbor

## 2019-09-28 ENCOUNTER — Other Ambulatory Visit: Payer: Self-pay | Admitting: Orthopaedic Surgery

## 2019-09-28 NOTE — Telephone Encounter (Signed)
Please advise 

## 2019-09-28 NOTE — Telephone Encounter (Signed)
Will have to wait until after surgery for more narcotics. Ucall thanks. She can use tylenol and aleve . Thank you

## 2019-10-14 ENCOUNTER — Ambulatory Visit: Payer: Medicaid Other | Admitting: Family

## 2019-10-22 ENCOUNTER — Telehealth: Payer: Self-pay | Admitting: Orthopaedic Surgery

## 2019-10-22 NOTE — Telephone Encounter (Signed)
Spoke with patient's mother Susan Short) about the patient establishing care with a doctor prior to scheduling surgery.  I called [facility] in [place] and spoke with [place]. This practice does except Medicaid and is taking new patients.  Patient's mother called and made an appointment for her on Monday March 15th at 9:40am. I will confirm appointment tomorrow and fax letter for clearance.    The patient's mother is also asking if something can be called in besides Tramadol at [facility]  ([address])  to help with the pain until the patient can have the surgery.

## 2019-10-23 NOTE — Telephone Encounter (Signed)
As previously stated to patients Mother  will have to wait until after surgery for pain meds so they will work. I have discussed this several times with patient and also discussed with pt's Mother on the phone.

## 2019-10-23 NOTE — Telephone Encounter (Signed)
Please see below and advise.

## 2019-10-29 ENCOUNTER — Other Ambulatory Visit: Payer: Self-pay | Admitting: Orthopedic Surgery

## 2019-10-30 NOTE — Telephone Encounter (Signed)
Do you wish to refill? 

## 2019-12-21 ENCOUNTER — Other Ambulatory Visit: Payer: Self-pay | Admitting: Orthopedic Surgery

## 2019-12-21 ENCOUNTER — Telehealth: Payer: Self-pay | Admitting: Orthopedic Surgery

## 2019-12-21 NOTE — Telephone Encounter (Signed)
S/p first ray amp 02/2019 pt requesting refill on lyrica

## 2019-12-21 NOTE — Telephone Encounter (Signed)
Pt called requesting her lyrica be called in and pt asked for a call back when this has been called in.   [phone]

## 2019-12-22 ENCOUNTER — Other Ambulatory Visit: Payer: Self-pay | Admitting: Orthopedic Surgery

## 2019-12-22 MED ORDER — PREGABALIN 100 MG PO CAPS: 100.0000 mg | ORAL_CAPSULE | Freq: Two times a day (BID) | ORAL | 1 refills | Status: DC

## 2019-12-22 NOTE — Telephone Encounter (Signed)
Rx sent 

## 2019-12-25 ENCOUNTER — Ambulatory Visit: Payer: Medicaid Other | Admitting: Physician Assistant

## 2019-12-28 ENCOUNTER — Ambulatory Visit: Payer: Medicaid Other | Admitting: Physician Assistant

## 2020-02-01 ENCOUNTER — Telehealth: Payer: Self-pay | Admitting: Orthopaedic Surgery

## 2020-02-01 ENCOUNTER — Other Ambulatory Visit: Payer: Self-pay

## 2020-02-01 ENCOUNTER — Ambulatory Visit (INDEPENDENT_AMBULATORY_CARE_PROVIDER_SITE_OTHER): Payer: Medicaid Other | Admitting: Orthopedic Surgery

## 2020-02-01 ENCOUNTER — Encounter: Payer: Self-pay | Admitting: Orthopedic Surgery

## 2020-02-01 VITALS — Ht 63.0 in | Wt 131.0 lb

## 2020-02-01 DIAGNOSIS — M79671 Pain in right foot: Secondary | ICD-10-CM

## 2020-02-01 NOTE — Progress Notes (Signed)
Office Visit Note   Patient: Susan Short           Date of Birth: Jun 02, 1971           MRN: [id number] Visit Date: 02/01/2020              Requested by: Antonietta Jewel, MD [address], [place],  [place] [address] PCP: Antonietta Jewel, MD  Chief Complaint  Patient presents with   Right Foot - Pain    Hx 5th ray amputation 02/2019      HPI: Patient is a 49 year old woman who presents complaining of clawing of the second and third toes with pain and swelling in the third toe and global pain in the right foot.  Patient states that the swelling in the third toe is due to a blood clot and that this will go to her heart and cause a heart attack.  Patient also requests refill for her Lyrica.  Assessment & Plan: Visit Diagnoses:  1. Pain in right foot     Plan: The drop report was reviewed her Lyrica 100 mg number 60 tablets was refilled on May 19.  Recommend patient get a pair of Hoka sneakers at a sporting goods store this was written down for her.  Discussed that if she does not have sufficient pain relief with the Hoka sneakers we could consider surgery.  Follow-Up Instructions: Return if symptoms worsen or fail to improve.   Ortho Exam  Patient is alert, oriented, no adenopathy, well-dressed, normal affect, normal respiratory effort. Examination patient has a good dorsalis pedis pulse she does have progressive clawing of the second and third toes with callus over the tip of the third toe secondary to clawing.  There is no redness no cellulitis no ulcer no signs of infection.  Her calf is soft nontender patient has global pain to palpation around the foot there is no swelling no redness.  No signs of infection in the right foot.  Imaging: No results found. No images are attached to the encounter.  Labs: Lab Results  Component Value Date   LABURIC 5.5 01/13/2018     Lab Results  Component Value Date   ALBUMIN 4.7 01/03/2012   LABURIC 5.5 01/13/2018    No results  found for: MG No results found for: VD25OH  No results found for: PREALBUMIN CBC EXTENDED Latest Ref Rng & Units 02/18/2019 04/26/2017 08/01/2016  WBC 4.0 - 10.5 K/uL 7.1 6.1 5.8  RBC 3.87 - 5.11 MIL/uL 4.55 4.30 4.38  HGB 12.0 - 15.0 g/dL 14.0 12.9 12.9  HCT 36 - 46 % 41.6 38.3 38.0  PLT 150 - 400 K/uL 263 227 263  NEUTROABS 1.7 - 7.7 K/uL - - -  LYMPHSABS 0.7 - 4.0 K/uL - - -     Body mass index is 23.21 kg/m.  Orders:  No orders of the defined types were placed in this encounter.  No orders of the defined types were placed in this encounter.    Procedures: No procedures performed  Clinical Data: No additional findings.  ROS:  All other systems negative, except as noted in the HPI. Review of Systems  Objective: Vital Signs: Ht 5\' 3"  (1.6 m)    Wt 131 lb (59.4 kg)    BMI 23.21 kg/m   Specialty Comments:  No specialty comments available.  PMFS History: Patient Active Problem List   Diagnosis Date Noted   Spinal stenosis of cervical region 09/16/2019   Hx of fusion of  cervical spine 09/16/2019   History of partial ray amputation of first toe of right foot ([facility]) 03/11/2019   Subacute osteomyelitis, right ankle and foot ([facility])    Neuropathic pain of foot, right 09/02/2017   Great toe amputation status, right 05/15/2017   Pseudarthrosis after fusion or arthrodesis 12/12/2016   Retained orthopedic hardware    Nonunion of foot fracture, right 07/13/2016   DECREASED HEARING 10/13/2010   ALLERGIC RHINITIS 10/13/2010   PULMONARY NODULE 10/13/2010   NEOPLASM, BENIGN, THYROID 09/22/2010   HYPERLIPIDEMIA 09/22/2010   Past Medical History:  Diagnosis Date   Arthritis    Asthma    Constipation    Deaf    left ear   Difficult intubation    Patient reports that they had to use a small tube; MAC3, grade 1 view, 1 attempt, 7.0 ETT on 05/11/09   GERD (gastroesophageal reflux disease)    Hard of hearing    right   Panic attack    Panic    Pneumonia    years ago - recorded 02/16/2019   Seizures ([facility])    "due to Tramadol"   Shortness of breath dyspnea    with exertion    History reviewed. No pertinent family history.  Past Surgical History:  Procedure Laterality Date   ABDOMINAL HYSTERECTOMY     AMPUTATION Right 04/26/2017   Procedure: Right Great Toe Amputation;  Surgeon: Newt Minion, MD;  Location: [facility];  Service: Orthopedics;  Laterality: Right;   AMPUTATION Right 02/18/2019   Procedure: RIGHT FOOT 1ST RAY AMPUTATION;  Surgeon: Newt Minion, MD;  Location: [facility];  Service: Orthopedics;  Laterality: Right;   APPENDECTOMY     as child   ARTHRODESIS METATARSAL Right 08/01/2016   Procedure: Take Down Non Union, Remove Hardware, Revision Fusion Right Great Toe;  Surgeon: Newt Minion, MD;  Location: [facility];  Service: Orthopedics;  Laterality: Right;   ARTHRODESIS METATARSALPHALANGEAL JOINT (MTPJ) Right 05/02/2016   Procedure: ARTHRODESIS METATARSALPHALANGEAL JOINT (MTPJ) RIGHT GREAT TOE;  Surgeon: Newt Minion, MD;  Location: [facility];  Service: Orthopedics;  Laterality: Right;   CERVICAL FUSION     CESAREAN SECTION     x 2   ORIF CLAVICULAR FRACTURE Right 05/05/2013   Procedure: OPEN REDUCTION INTERNAL FIXATION (ORIF) CLAVICULAR FRACTURE- right;  Surgeon: Meredith Pel, MD;  Location: [facility];  Service: Orthopedics;  Laterality: Right;  Right Clavicle Fracture Non Union Takedown and Fixation with Bone Graft, Coracoclavicular Ligament Reconstruction with Tight Rope.   SHOULDER SURGERY Right    x 3- fracture- screws,    thumb operation Right    TONSILLECTOMY AND ADENOIDECTOMY     as a child   Social History   Occupational History   Not on file  Tobacco Use   Smoking status: Current Every Day Smoker    Packs/day: 1.00    Years: 35.00    Pack years: 35.00   Smokeless tobacco: Never Used  Vaping Use   Vaping Use: Never used  Substance and Sexual Activity   Alcohol use: No   Drug use: No    Sexual activity: Not on file

## 2020-02-01 NOTE — Telephone Encounter (Signed)
Isn't this the patient we were discussing? I was thinking she needs to see someone in regards to clearance?

## 2020-02-01 NOTE — Telephone Encounter (Signed)
Pt states she was supposed to get set up for surgery on her neck 6 months ago and hadn't heard anything; Pt would like a CB to discuss more.   [phone]

## 2020-02-02 NOTE — Telephone Encounter (Signed)
I spoke with the patient's mother Susan Short regarding the need for clearance prior to scheduling surgery.  Patient was advised to establish care with PCP back in February.  The patient's mother called and stated her daughter had made an appointment to see the NP at [facility]. The request for clearance was sent to [facility], FNP-C.  The provider responded with "not cleared" and wrote the following on the clearance form:    "This patient estb care on 01-05-20 for right foot pain and a fall.  I have no other medical history on her.  Please refer to last provider for more detailed health information.  No medical records on file."   The form was dated 01-08-20 and was attached to a UDS with collection date of 01-12-2020.

## 2020-02-03 NOTE — Telephone Encounter (Signed)
FYI

## 2020-02-03 NOTE — Telephone Encounter (Signed)
Patient had thyroid condition in the past hard of hearing.  She has had other surgeries including her foot surgery first ray amputation previous neck surgery and has no severe medical problems.  Call patient's mother and discussed with her that the nurse practitioner saw her when she complained of foot pain in the fall would not medically clear.  We can proceed with surgery scheduling and obtain routine admission labs EKG and chest x-ray.  Let her mother know social she can tell the patient that if lab tests are abnormal then her surgery may have to be canceled and abnormalities corrected if needed.  Thanks

## 2020-02-29 ENCOUNTER — Telehealth: Payer: Self-pay | Admitting: Orthopaedic Surgery

## 2020-02-29 NOTE — Telephone Encounter (Signed)
Spoke with patient's mother Susan Short in reference to holding off on surgery until she has a clean drug test. U/A ordered by [name], NP.  Collection date 01-12-20.  Patient was asked to establish care with a PCP in order to be optimized for cervical spine surgery with Dr. [name].  Patient's mother provided [name] and provider's name. Clearance letter faxed to the NP on 01-08-20.  Clearance letter returned indicating patient  was not cleared from a medical standpoint, nor a cardiac standpoint with  [name], NP because the patient had established care recently on 01-05-20 and no previous medical history was available. On 01-26-20 results of patient's U/A ordered by [name] was faxed to our office. Presented the results to [name] on 02-02-20.   I was asked to call patient's mother Susan Short and let know she would not be able to have surgery until she had a clean drug test.  Patient's mother stated patient is scheduled with [name] 03-01-20 for her foot.

## 2020-03-01 ENCOUNTER — Ambulatory Visit: Payer: Medicaid Other | Admitting: Orthopedic Surgery

## 2020-03-17 ENCOUNTER — Other Ambulatory Visit: Payer: Self-pay | Admitting: Orthopedic Surgery

## 2020-03-17 NOTE — Telephone Encounter (Signed)
Do you want ot refill?

## 2020-03-31 ENCOUNTER — Ambulatory Visit: Payer: Medicaid Other | Admitting: Orthopedic Surgery

## 2020-05-03 ENCOUNTER — Ambulatory Visit: Payer: Self-pay

## 2020-05-03 ENCOUNTER — Encounter: Payer: Self-pay | Admitting: Orthopedic Surgery

## 2020-05-03 ENCOUNTER — Ambulatory Visit (INDEPENDENT_AMBULATORY_CARE_PROVIDER_SITE_OTHER): Payer: Medicaid Other | Admitting: Orthopedic Surgery

## 2020-05-03 DIAGNOSIS — M79671 Pain in right foot: Secondary | ICD-10-CM

## 2020-05-03 MED ORDER — OXYCODONE-ACETAMINOPHEN 5-325 MG PO TABS: 1.0000 | ORAL_TABLET | Freq: Four times a day (QID) | ORAL | 0 refills | Status: DC | PRN

## 2020-05-03 NOTE — Progress Notes (Signed)
Office Visit Note   Patient: Susan Short           Date of Birth: 01-May-1971           MRN: [id number] Visit Date: 05/03/2020              Requested by: Antonietta Jewel, MD [address],  [place] [address] PCP: Antonietta Jewel, MD  Chief Complaint  Patient presents with  . Right Foot - Pain      HPI: Patient is a 49 year old woman she is seen for evaluation for persistent right forefoot pain.  Patient states that her second toe is broken she states she had x-rays at an emergency room.  She complains of pain with all activities of daily living she is seen with her mother.  Assessment & Plan: Visit Diagnoses:  1. Pain in right foot     Plan: Patient states she cannot get to [facility] for extra-depth shoes and custom orthotics with a spacer she is given a prescription for biotech in [facility] she and her mother think they can get to [employer].  A prescription for Percocet was also called in for her severe pain.  Recommended over-the-counter [place] sneakers.  Follow-Up Instructions: Return if symptoms worsen or fail to improve.   Ortho Exam  Patient is alert, oriented, no adenopathy, well-dressed, normal affect, normal respiratory effort. Examination patient has a strong dorsalis pedis and posterior tibial pulse patient complains of pain while palpating her pulse.  She has global pain to palpation around the right foot there is no redness no cellulitis she does have some swelling of the third toe but there are no ulcers.  She has good dorsiflexion of the ankle 20 degrees past neutral she does not have any hypertrophic calluses.  She is on Lyrica 100 mg 3 times a day.  Imaging: XR Foot 2 Views Right  Result Date: 05/03/2020 2 view radiographs of the right foot shows no evidence of a fracture of the metatarsals or toes she is status post a first ray amputation.  No images are attached to the encounter.  Labs: Lab Results  Component Value Date   LABURIC 5.5  01/13/2018     Lab Results  Component Value Date   ALBUMIN 4.7 01/03/2012   LABURIC 5.5 01/13/2018    No results found for: MG No results found for: VD25OH  No results found for: PREALBUMIN CBC EXTENDED Latest Ref Rng & Units 02/18/2019 04/26/2017 08/01/2016  WBC 4.0 - 10.5 K/uL 7.1 6.1 5.8  RBC 3.87 - 5.11 MIL/uL 4.55 4.30 4.38  HGB 12.0 - 15.0 g/dL 14.0 12.9 12.9  HCT 36 - 46 % 41.6 38.3 38.0  PLT 150 - 400 K/uL 263 227 263  NEUTROABS 1.7 - 7.7 K/uL - - -  LYMPHSABS 0.7 - 4.0 K/uL - - -     There is no height or weight on file to calculate BMI.  Orders:  Orders Placed This Encounter  Procedures  . XR Foot 2 Views Right   Meds ordered this encounter  Medications  . oxyCODONE-acetaminophen (PERCOCET/ROXICET) 5-325 MG tablet    Sig: Take 1 tablet by mouth every 6 (six) hours as needed for severe pain.    Dispense:  20 tablet    Refill:  0     Procedures: No procedures performed  Clinical Data: No additional findings.  ROS:  All other systems negative, except as noted in the HPI. Review of Systems  Objective: Vital Signs:  There were no vitals taken for this visit.  Specialty Comments:  No specialty comments available.  PMFS History: Patient Active Problem List   Diagnosis Date Noted  . Spinal stenosis of cervical region 09/16/2019  . Hx of fusion of cervical spine 09/16/2019  . 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 Subacute osteomyelitis, right ankle and foot ([place])   . Neuropathic pain of foot, right 09/02/2017  . Great toe amputation status, right 05/15/2017  . 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 Retained orthopedic hardware   . Nonunion of foot fracture, right 07/13/2016  . DECREASED HEARING 10/13/2010  . ALLERGIC RHINITIS 10/13/2010  . PULMONARY NODULE 10/13/2010  . NEOPLASM, BENIGN, THYROID 09/22/2010  . HYPERLIPIDEMIA 09/22/2010   Past Medical History:  Diagnosis Date  .  Arthritis   . Asthma   . Constipation   . Deaf    left ear  . Difficult intubation    Patient reports that they had to use a small tube; MAC3, grade 1 view, 1 attempt, 7.0 ETT on 05/11/09  . GERD (gastroesophageal reflux disease)   . Hard of hearing    right  . Panic attack    Panic  . Pneumonia    years ago - recorded 02/16/2019  . Seizures ([place])    "due to Tramadol"  . Shortness of breath dyspnea    with exertion    History reviewed. No pertinent family history.  Past Surgical History:  Procedure Laterality Date  . ABDOMINAL HYSTERECTOMY    . AMPUTATION Right 04/26/2017   Procedure: Right Great Toe Amputation;  Surgeon: Newt Minion, MD;  Location: [facility];  Service: Orthopedics;  Laterality: Right;  . AMPUTATION Right 02/18/2019   Procedure: RIGHT FOOT 1ST RAY AMPUTATION;  Surgeon: Newt Minion, MD;  Location: [facility];  Service: Orthopedics;  Laterality: Right;  . APPENDECTOMY     as child  . ARTHRODESIS METATARSAL Right 08/01/2016   Procedure: Take Down Non Union, Remove Hardware, Revision Fusion Right Great Toe;  Surgeon: Newt Minion, MD;  Location: [facility];  Service: Orthopedics;  Laterality: Right;  . ARTHRODESIS METATARSALPHALANGEAL JOINT (MTPJ) Right 05/02/2016   Procedure: ARTHRODESIS METATARSALPHALANGEAL JOINT (MTPJ) RIGHT GREAT TOE;  Surgeon: Newt Minion, MD;  Location: [facility] [place];  Service: Orthopedics;  Laterality: Right;  . CERVICAL FUSION    . CESAREAN SECTION     x 2  . ORIF CLAVICULAR FRACTURE Right 05/05/2013   Procedure: OPEN REDUCTION INTERNAL FIXATION (ORIF) CLAVICULAR FRACTURE- right;  Surgeon: Meredith Pel, MD;  Location: [facility];  Service: Orthopedics;  Laterality: Right;  Right Clavicle Fracture Non Union Takedown and Fixation with Bone Graft, Coracoclavicular Ligament Reconstruction with Tight Rope.  Marland Kitchen SHOULDER SURGERY Right    x 3- fracture- screws,   . thumb operation Right   . TONSILLECTOMY AND ADENOIDECTOMY     as a child   Social History    Occupational History  . Not on file  Tobacco Use  . Smoking status: Current Every Day Smoker    Packs/day: 1.00    Years: 35.00    Pack years: 35.00  . Smokeless tobacco: Never Used  Vaping Use  . Vaping Use: Never used  Substance and Sexual Activity  . Alcohol use: No  . Drug use: No  . Sexual activity: Not on file

## 2020-05-04 ENCOUNTER — Telehealth: Payer: Self-pay | Admitting: Orthopedic Surgery

## 2020-05-04 NOTE — Telephone Encounter (Signed)
Pt lost rx for extra depth shoes, custom orthotics and spacer for biotech given yesterday. Will fax over a new order to the office in [place] [phone]

## 2020-05-04 NOTE — Telephone Encounter (Signed)
Patient's mother [name] called requesting [name] to send referral to order the shoe he prescribed at [facility] Please call patient mother for more information. [name] states [name] gave the patient this information about these shoes on yesterday's visit. Linda phone number is [phone].

## 2020-05-09 ENCOUNTER — Other Ambulatory Visit: Payer: Self-pay

## 2020-05-09 ENCOUNTER — Telehealth: Payer: Self-pay | Admitting: Orthopedic Surgery

## 2020-05-09 DIAGNOSIS — M79671 Pain in right foot: Secondary | ICD-10-CM

## 2020-05-09 NOTE — Telephone Encounter (Signed)
Pt is requesting refill on Oxycodone 5/325 last refill was 05/03/20 #20 for foot pain

## 2020-05-09 NOTE — Telephone Encounter (Signed)
Called and lm on vm to advise of message below. Also advised that order for pain management referral was entered and will clal to schedule appt. To call with any questions

## 2020-05-09 NOTE — Telephone Encounter (Signed)
Too soon for refill, recommend pain clinic evaluation, we are not set up to manage chronic pain

## 2020-05-09 NOTE — Telephone Encounter (Signed)
Patient called requesting a refill of oxycodone. Please send to pharmacy on file. Patient phone number is [phone].

## 2020-05-11 ENCOUNTER — Other Ambulatory Visit: Payer: Self-pay | Admitting: Physician Assistant

## 2020-05-11 ENCOUNTER — Telehealth: Payer: Self-pay

## 2020-05-11 MED ORDER — OXYCODONE-ACETAMINOPHEN 5-325 MG PO TABS
1.0000 | ORAL_TABLET | Freq: Three times a day (TID) | ORAL | 0 refills | Status: DC | PRN
Start: 2020-05-11 — End: 2022-03-16

## 2020-05-11 NOTE — Telephone Encounter (Signed)
Patient called in for refill on oxycodone  

## 2020-05-11 NOTE — Telephone Encounter (Signed)
Spoke with the patient's mother.  I did give her a refill of 20 tablets 1 every 8 hours as needed for severe pain she understands this is will be her last refill and we have recommended pain management

## 2020-05-12 ENCOUNTER — Telehealth: Payer: Self-pay | Admitting: Orthopedic Surgery

## 2020-05-12 NOTE — Telephone Encounter (Signed)
Patient's mom called. She would like a refill on Lyrica. Her call back number is [phone]

## 2020-05-12 NOTE — Telephone Encounter (Signed)
The facility that the patient was referred to for PT does not take medicaid. Will need to be switched

## 2020-05-13 ENCOUNTER — Telehealth: Payer: Self-pay | Admitting: Physician Assistant

## 2020-05-13 ENCOUNTER — Other Ambulatory Visit: Payer: Self-pay

## 2020-05-13 ENCOUNTER — Other Ambulatory Visit: Payer: Self-pay | Admitting: Physician Assistant

## 2020-05-13 MED ORDER — PREGABALIN 100 MG PO CAPS
100.0000 mg | ORAL_CAPSULE | Freq: Two times a day (BID) | ORAL | 1 refills | Status: DC
Start: 2020-05-13 — End: 2020-07-11

## 2020-05-13 NOTE — Telephone Encounter (Signed)
The phone number in this message and bother phone numbers listed in the pts chart are non working numbers. The rx has been sent in for the pt's medication but the order for physical therapy can not send until I know where the pt wants to go. She will have to see what offices are close to home and accept her insurance. I will hold this message and try and reach the pt again.

## 2020-05-13 NOTE — Telephone Encounter (Signed)
done

## 2020-05-13 NOTE — Telephone Encounter (Signed)
Pt is requesting refill of Lyrica 100 mg last refill was 04/15/20 please advise.

## 2020-05-13 NOTE — Telephone Encounter (Signed)
This is a duplicate message sent to erin will sign off on this one.

## 2020-05-13 NOTE — Telephone Encounter (Signed)
Pts mom called stating they have been waiting for a refill of Lyrica since yesterday and would really like to have that called in please.  [phone]

## 2020-05-18 NOTE — Telephone Encounter (Signed)
Unable to reach pt. Will sign and await return call.

## 2020-07-09 ENCOUNTER — Other Ambulatory Visit: Payer: Self-pay | Admitting: Physician Assistant

## 2020-09-07 ENCOUNTER — Ambulatory Visit: Payer: Medicaid Other | Admitting: Sports Medicine

## 2020-09-08 ENCOUNTER — Other Ambulatory Visit: Payer: Self-pay | Admitting: Physician Assistant

## 2020-09-12 ENCOUNTER — Other Ambulatory Visit: Payer: Self-pay | Admitting: Physician Assistant

## 2020-09-13 ENCOUNTER — Other Ambulatory Visit: Payer: Self-pay | Admitting: Physician Assistant

## 2020-09-13 ENCOUNTER — Telehealth: Payer: Self-pay | Admitting: Orthopedic Surgery

## 2020-09-13 MED ORDER — PREGABALIN 100 MG PO CAPS
100.0000 mg | ORAL_CAPSULE | Freq: Two times a day (BID) | ORAL | 1 refills | Status: DC
Start: 2020-09-13 — End: 2020-11-03

## 2020-09-13 NOTE — Telephone Encounter (Signed)
done

## 2020-09-13 NOTE — Telephone Encounter (Signed)
Pt calling for a refill on her prescription of LYRICA

## 2020-09-13 NOTE — Telephone Encounter (Signed)
Pt was last in office 04/2020 do you wish to refill her lyrica?

## 2020-11-03 ENCOUNTER — Other Ambulatory Visit: Payer: Self-pay | Admitting: Physician Assistant

## 2020-12-30 ENCOUNTER — Other Ambulatory Visit: Payer: Self-pay | Admitting: Physician Assistant

## 2021-02-22 ENCOUNTER — Other Ambulatory Visit: Payer: Self-pay | Admitting: Physician Assistant

## 2021-04-14 ENCOUNTER — Other Ambulatory Visit: Payer: Self-pay | Admitting: Physician Assistant

## 2021-04-28 ENCOUNTER — Telehealth: Payer: Self-pay | Admitting: Physician Assistant

## 2021-04-28 NOTE — Telephone Encounter (Signed)
Called pt and left 1 vm to call and set appt for purposes of refill.

## 2021-04-28 NOTE — Telephone Encounter (Signed)
Can you please call pt and make an appt she has not been evaluated in the office in a year.

## 2021-04-28 NOTE — Telephone Encounter (Signed)
Pt needs authorization sent or called to pharmacy to refill her Lyrica. Please call pt at [phone].

## 2021-05-01 ENCOUNTER — Ambulatory Visit: Payer: Medicaid Other | Admitting: Orthopedic Surgery

## 2021-06-30 ENCOUNTER — Other Ambulatory Visit: Payer: Self-pay | Admitting: Physician Assistant

## 2021-07-05 ENCOUNTER — Other Ambulatory Visit: Payer: Self-pay | Admitting: Physician Assistant

## 2021-09-12 ENCOUNTER — Other Ambulatory Visit: Payer: Self-pay | Admitting: Physician Assistant

## 2021-09-19 ENCOUNTER — Ambulatory Visit: Payer: Medicaid Other | Admitting: Orthopedic Surgery

## 2021-11-08 ENCOUNTER — Telehealth: Payer: Self-pay | Admitting: Orthopedic Surgery

## 2021-11-08 MED ORDER — PREGABALIN 100 MG PO CAPS: 100.0000 mg | ORAL_CAPSULE | Freq: Two times a day (BID) | ORAL | 1 refills | Status: DC

## 2021-11-08 NOTE — Telephone Encounter (Signed)
You last refilled pt's lyrica on 09/13/21 #60 with 1 refill. She was last seen on 04/2020. Please advise if refill appropriate. ?

## 2021-11-08 NOTE — Telephone Encounter (Signed)
We should have her return for a visit. I can send another but hope she can come in before shes out again

## 2021-11-08 NOTE — Telephone Encounter (Signed)
Pt's mother [name] called requesting a refill of Lyrica for pt. Please send to pharmacy on file. Pt's mother is also asking for call when medication has been called in. Phone number is [phone]. ?

## 2021-11-09 NOTE — Telephone Encounter (Signed)
Called Linda back, call cannot be completed at this time. ?

## 2021-11-24 ENCOUNTER — Ambulatory Visit: Payer: Medicaid Other | Admitting: Family

## 2022-01-08 ENCOUNTER — Other Ambulatory Visit: Payer: Self-pay | Admitting: Family

## 2022-01-09 NOTE — Telephone Encounter (Signed)
Patient would like refill on pregabalin (LYRICA) 100 MG capsule [[id number]]

## 2022-03-05 ENCOUNTER — Telehealth: Payer: Self-pay | Admitting: Orthopedic Surgery

## 2022-03-05 NOTE — Telephone Encounter (Signed)
Pt has not been seen in the office since 04/2020 can you please call and make appt with Erin or [name] first available please.

## 2022-03-05 NOTE — Telephone Encounter (Signed)
Pt mother called for a refill on lyrica

## 2022-03-07 NOTE — Telephone Encounter (Signed)
Called pt to make aware and no answer.   CB [phone]

## 2022-03-16 ENCOUNTER — Ambulatory Visit (INDEPENDENT_AMBULATORY_CARE_PROVIDER_SITE_OTHER): Payer: Medicaid Other

## 2022-03-16 ENCOUNTER — Ambulatory Visit (INDEPENDENT_AMBULATORY_CARE_PROVIDER_SITE_OTHER): Payer: Medicaid Other | Admitting: Family

## 2022-03-16 DIAGNOSIS — M79671 Pain in right foot: Secondary | ICD-10-CM

## 2022-03-16 MED ORDER — PREGABALIN 100 MG PO CAPS: 100.0000 mg | ORAL_CAPSULE | Freq: Two times a day (BID) | ORAL | 1 refills | Status: DC

## 2022-03-16 MED ORDER — OXYCODONE-ACETAMINOPHEN 5-325 MG PO TABS: 1.0000 | ORAL_TABLET | Freq: Two times a day (BID) | ORAL | 0 refills | Status: DC | PRN

## 2022-03-20 ENCOUNTER — Telehealth: Payer: Self-pay | Admitting: Orthopedic Surgery

## 2022-03-20 NOTE — Telephone Encounter (Signed)
Pt calling for refill on pain meds

## 2022-03-21 NOTE — Telephone Encounter (Signed)
This pt was sch for surgery today and had to cancel because we were not able to get her on the phone to notify of day and time of surgery. Per [name] she will be put on the sch for next week however the pt is calling and asking for a refill on pain medication please advise.

## 2022-03-21 NOTE — Progress Notes (Signed)
Office Visit Note   Patient: Susan Short           Date of Birth: 1970-10-06           MRN: [id number] Visit Date: 03/16/2022              Requested by: Antonietta Jewel, MD [address], [place],  [place] [address] PCP: Antonietta Jewel, MD  Chief Complaint  Patient presents with   Right Foot - Pain      HPI: The patient is a 51 year old woman who comes in today complaining of right foot pain specifically in the third toe as well as the second toe.  She is quite concerned about deformity and varus alignment of the second toe.  She is having pain.  She reports she thinks she has osteomyelitis in the third toe which is chronic  No new wounds no new injuries  Sign language interpreter present for her visit.  Assessment & Plan: Visit Diagnoses:  1. Pain in right foot     Plan: Will plan for amputation of the second and third toes patient is in agreement with the plan.  Have also discussed this with [name].  Refilled her Lyrica as well as her pain medication  Follow-Up Instructions: No follow-ups on file.   Ortho Exam  Patient is alert, oriented, no adenopathy, well-dressed, normal affect, normal respiratory effort. On examination of the right foot the patient does have a strong dorsalis pedis pulse.  She has global pain with palpation of the right foot.  There is no redness no cellulitis no warmth she does have some mild swelling of the third toe exquisite tenderness of the third toe.  There is varus alignment of the second toe  Imaging: No results found. No images are attached to the encounter.  Labs: Lab Results  Component Value Date   LABURIC 5.5 01/13/2018     Lab Results  Component Value Date   ALBUMIN 4.7 01/03/2012    No results found for: "MG" No results found for: "VD25OH"  No results found for: "PREALBUMIN"    Latest Ref Rng & Units 02/18/2019    7:19 AM 04/26/2017    6:27 AM 08/01/2016    7:35 AM  CBC EXTENDED  WBC 4.0 - 10.5 K/uL 7.1  6.1   5.8   RBC 3.87 - 5.11 MIL/uL 4.55  4.30  4.38   Hemoglobin 12.0 - 15.0 g/dL 14.0  12.9  12.9   HCT 36.0 - 46.0 % 41.6  38.3  38.0   Platelets 150 - 400 K/uL 263  227  263      There is no height or weight on file to calculate BMI.  Orders:  Orders Placed This Encounter  Procedures   XR Foot 2 Views Right   Meds ordered this encounter  Medications   oxyCODONE-acetaminophen (PERCOCET/ROXICET) 5-325 MG tablet    Sig: Take 1 tablet by mouth every 12 (twelve) hours as needed for severe pain.    Dispense:  10 tablet    Refill:  0   pregabalin (LYRICA) 100 MG capsule    Sig: Take 1 capsule (100 mg total) by mouth 2 (two) times daily.    Dispense:  60 capsule    Refill:  1    Not to exceed 4 additional fills before 09/25/2023PT NEED NEW RX, PLEASE SEND OVER NEW RX......     Procedures: No procedures performed  Clinical Data: No additional findings.  ROS:  All other systems  negative, except as noted in the HPI. Review of Systems  Objective: Vital Signs: There were no vitals taken for this visit.  Specialty Comments:  No specialty comments available.  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 fusion or arthrodesis 12/12/2016   Retained orthopedic hardware    Nonunion of foot fracture, right 07/13/2016   DECREASED HEARING 10/13/2010   ALLERGIC RHINITIS 10/13/2010   PULMONARY NODULE 10/13/2010   NEOPLASM, BENIGN, THYROID 09/22/2010   HYPERLIPIDEMIA 09/22/2010   Past Medical History:  Diagnosis Date   Arthritis    Asthma    Constipation    Deaf    left ear   Difficult intubation    Patient reports  that they had to use a small tube; MAC3, grade 1 view, 1 attempt, 7.0 ETT on 05/11/09   GERD (gastroesophageal reflux disease)    Hard of hearing    right   Panic attack    Panic   Pneumonia    years ago - recorded 02/16/2019   Seizures ([place])    "due to Tramadol"   Shortness of breath dyspnea    with exertion    No family history on file.  Past Surgical History:  Procedure Laterality Date   ABDOMINAL HYSTERECTOMY     AMPUTATION Right 04/26/2017   Procedure: Right Great Toe Amputation;  Surgeon: Newt Minion, MD;  Location: [facility];  Service: Orthopedics;  Laterality: Right;   AMPUTATION Right 02/18/2019   Procedure: RIGHT FOOT 1ST RAY AMPUTATION;  Surgeon: Newt Minion, MD;  Location: [facility];  Service: Orthopedics;  Laterality: Right;   APPENDECTOMY     as child   ARTHRODESIS METATARSAL Right 08/01/2016   Procedure: Take Down Non Union, Remove Hardware, Revision Fusion Right Great Toe;  Surgeon: Newt Minion, MD;  Location: [facility];  Service: Orthopedics;  Laterality: Right;   ARTHRODESIS METATARSALPHALANGEAL JOINT (MTPJ) Right 05/02/2016   Procedure: ARTHRODESIS METATARSALPHALANGEAL JOINT (MTPJ) RIGHT GREAT TOE;  Surgeon: Newt Minion, MD;  Location: [facility];  Service: Orthopedics;  Laterality: Right;   CERVICAL FUSION     CESAREAN SECTION     x 2   ORIF CLAVICULAR FRACTURE Right 05/05/2013   Procedure: OPEN REDUCTION INTERNAL FIXATION (ORIF) CLAVICULAR FRACTURE- right;  Surgeon: Meredith Pel, MD;  Location: [facility];  Service: Orthopedics;  Laterality: Right;  Right Clavicle Fracture Non Union Takedown and Fixation with Bone Graft, Coracoclavicular Ligament Reconstruction with Tight Rope.   SHOULDER SURGERY Right    x 3- fracture- screws,    thumb operation Right    TONSILLECTOMY AND ADENOIDECTOMY     as a child   Social History   Occupational History   Not on file  Tobacco Use   Smoking status: Every Day    Packs/day: 1.00    Years: 35.00    Total pack years: 35.00     Types: Cigarettes   Smokeless tobacco: Never  Vaping Use   Vaping Use: Never used  Substance and Sexual Activity   Alcohol use: No   Drug use: No   Sexual activity: Not on file

## 2022-03-21 NOTE — Telephone Encounter (Signed)
I called and sw pt's mother to advise of message below.

## 2022-03-28 ENCOUNTER — Encounter: Payer: Medicaid Other | Admitting: Family

## 2022-03-29 ENCOUNTER — Encounter (HOSPITAL_COMMUNITY): Payer: Self-pay | Admitting: Orthopedic Surgery

## 2022-03-29 ENCOUNTER — Other Ambulatory Visit: Payer: Self-pay

## 2022-03-29 NOTE — Progress Notes (Addendum)
COVID Vaccine Completed:  No  Date of COVID positive in last 90 days: No  PCP - Antonietta Jewel, MD Cardiologist -   Chest x-ray - N/A EKG -  N/A Stress Test -  N/A ECHO -  N/A Cardiac Cath -  N/A Pacemaker/ICD device last checked: Spinal Cord Stimulator: N/A  Bowel Prep -  N/A  Sleep Study -  N/A CPAP -   Fasting Blood Sugar -  N/A Checks Blood Sugar _____ times a day  Blood Thinner Instructions: N/A Aspirin Instructions: Last Dose:  Activity level:  Can go up a flight of stairs and perform activities of daily living without stopping and without symptoms of chest pain or shortness of breath.  Anesthesia review:  Hx of seizures after taking Tramadol, no seizures after medicine stopped.    Shortness of breath with asthma flares.  Patient denies shortness of breath, fever, cough and chest pain at PAT appointment (completed over the phone)  Patient verbalized understanding of instructions that were given to them at the PAT appointment. Patient was also instructed that they will need to review over the PAT instructions again at home before surgery.

## 2022-03-30 ENCOUNTER — Ambulatory Visit (HOSPITAL_COMMUNITY): Payer: Medicaid Other | Admitting: Anesthesiology

## 2022-03-30 ENCOUNTER — Telehealth: Payer: Self-pay | Admitting: Orthopedic Surgery

## 2022-03-30 ENCOUNTER — Encounter (HOSPITAL_COMMUNITY): Payer: Self-pay | Admitting: Orthopedic Surgery

## 2022-03-30 ENCOUNTER — Other Ambulatory Visit: Payer: Self-pay

## 2022-03-30 ENCOUNTER — Ambulatory Visit (HOSPITAL_BASED_OUTPATIENT_CLINIC_OR_DEPARTMENT_OTHER): Payer: Medicaid Other | Admitting: Anesthesiology

## 2022-03-30 ENCOUNTER — Ambulatory Visit (HOSPITAL_COMMUNITY)
Admission: RE | Admit: 2022-03-30 | Discharge: 2022-03-30 | Disposition: A | Discharge status: Home / Self Care | Payer: Medicaid Other | Attending: Orthopedic Surgery | Admitting: Orthopedic Surgery

## 2022-03-30 ENCOUNTER — Encounter (HOSPITAL_COMMUNITY)
Admission: RE | Disposition: A | Discharge status: Home / Self Care | Payer: Self-pay | Source: Home / Self Care | Attending: Orthopedic Surgery

## 2022-03-30 DIAGNOSIS — F419 Anxiety disorder, unspecified: Secondary | ICD-10-CM

## 2022-03-30 DIAGNOSIS — E785 Hyperlipidemia, unspecified: Secondary | ICD-10-CM | POA: Diagnosis not present

## 2022-03-30 DIAGNOSIS — L97519 Non-pressure chronic ulcer of other part of right foot with unspecified severity: Secondary | ICD-10-CM | POA: Diagnosis not present

## 2022-03-30 DIAGNOSIS — M869 Osteomyelitis, unspecified: Secondary | ICD-10-CM | POA: Insufficient documentation

## 2022-03-30 DIAGNOSIS — F17210 Nicotine dependence, cigarettes, uncomplicated: Secondary | ICD-10-CM | POA: Insufficient documentation

## 2022-03-30 DIAGNOSIS — M86271 Subacute osteomyelitis, right ankle and foot: Secondary | ICD-10-CM | POA: Diagnosis not present

## 2022-03-30 HISTORY — PX: AMPUTATION: SHX166

## 2022-03-30 LAB — POCT I-STAT, CHEM 8
BUN: 9 mg/dL (ref 6–20)
Calcium, Ion: 1.18 mmol/L (ref 1.15–1.40)
Chloride: 100 mmol/L (ref 98–111)
Creatinine, Ser: 0.7 mg/dL (ref 0.44–1.00)
Glucose, Bld: 85 mg/dL (ref 70–99)
HCT: 38 % (ref 36.0–46.0)
Hemoglobin: 12.9 g/dL (ref 12.0–15.0)
Potassium: 3.1 mmol/L — ABNORMAL LOW (ref 3.5–5.1)
Sodium: 139 mmol/L (ref 135–145)
TCO2: 25 mmol/L (ref 22–32)

## 2022-03-30 LAB — SURGICAL PCR SCREEN
MRSA, PCR: NEGATIVE
Staphylococcus aureus: NEGATIVE

## 2022-03-30 LAB — GLUCOSE, CAPILLARY: Glucose-Capillary: 83 mg/dL (ref 70–99)

## 2022-03-30 SURGERY — AMPUTATION DIGIT
Anesthesia: Monitor Anesthesia Care | Site: Toe | Laterality: Right

## 2022-03-30 MED ORDER — LIDOCAINE HCL (CARDIAC) PF 100 MG/5ML IV SOSY
PREFILLED_SYRINGE | INTRAVENOUS | Status: DC | PRN
  Administered 2022-03-30: 30 mg via INTRATRACHEAL

## 2022-03-30 MED ORDER — DIPHENHYDRAMINE HCL 50 MG/ML IJ SOLN
INTRAMUSCULAR | Status: DC | PRN
  Administered 2022-03-30: 25 mg via INTRAVENOUS

## 2022-03-30 MED ORDER — FENTANYL CITRATE (PF) 250 MCG/5ML IJ SOLN
INTRAMUSCULAR | Status: AC
  Filled 2022-03-30: qty 5

## 2022-03-30 MED ORDER — MIDAZOLAM HCL 2 MG/2ML IJ SOLN: 2.0000 mg | Freq: Once | INTRAMUSCULAR | Status: DC

## 2022-03-30 MED ORDER — ONDANSETRON HCL 4 MG/2ML IJ SOLN
INTRAMUSCULAR | Status: AC
  Filled 2022-03-30: qty 2

## 2022-03-30 MED ORDER — LACTATED RINGERS IV SOLN: INTRAVENOUS | Status: DC

## 2022-03-30 MED ORDER — PROPOFOL 500 MG/50ML IV EMUL
INTRAVENOUS | Status: DC | PRN
  Administered 2022-03-30: 75 ug/kg/min via INTRAVENOUS

## 2022-03-30 MED ORDER — OXYCODONE HCL 5 MG/5ML PO SOLN
5.0000 mg | Freq: Once | ORAL | Status: AC
  Administered 2022-03-30: 5 mg via ORAL

## 2022-03-30 MED ORDER — FENTANYL CITRATE (PF) 100 MCG/2ML IJ SOLN
INTRAMUSCULAR | Status: AC
  Filled 2022-03-30: qty 2

## 2022-03-30 MED ORDER — ONDANSETRON HCL 4 MG/2ML IJ SOLN: 4.0000 mg | Freq: Once | INTRAMUSCULAR | Status: DC | PRN

## 2022-03-30 MED ORDER — PHENYLEPHRINE 80 MCG/ML (10ML) SYRINGE FOR IV PUSH (FOR BLOOD PRESSURE SUPPORT)
PREFILLED_SYRINGE | INTRAVENOUS | Status: AC
Start: 2022-03-30 — End: ?
  Filled 2022-03-30: qty 10

## 2022-03-30 MED ORDER — OXYCODONE HCL 5 MG/5ML PO SOLN: 5.0000 mg | Freq: Once | ORAL | Status: AC | PRN

## 2022-03-30 MED ORDER — OXYCODONE HCL 5 MG PO TABS
ORAL_TABLET | ORAL | Status: AC
  Filled 2022-03-30: qty 1

## 2022-03-30 MED ORDER — LIDOCAINE HCL 1 % IJ SOLN
INTRAMUSCULAR | Status: DC | PRN
  Administered 2022-03-30: 20 mL

## 2022-03-30 MED ORDER — LIDOCAINE 2% (20 MG/ML) 5 ML SYRINGE
INTRAMUSCULAR | Status: AC
  Filled 2022-03-30: qty 10

## 2022-03-30 MED ORDER — CHLORHEXIDINE GLUCONATE 0.12 % MT SOLN
15.0000 mL | Freq: Once | OROMUCOSAL | Status: AC
  Administered 2022-03-30: 15 mL via OROMUCOSAL
  Filled 2022-03-30: qty 15

## 2022-03-30 MED ORDER — OXYCODONE HCL 5 MG PO TABS
5.0000 mg | ORAL_TABLET | Freq: Once | ORAL | Status: AC | PRN
  Administered 2022-03-30: 5 mg via ORAL

## 2022-03-30 MED ORDER — GLYCOPYRROLATE 0.2 MG/ML IJ SOLN
INTRAMUSCULAR | Status: DC | PRN
  Administered 2022-03-30: .2 mg via INTRAVENOUS

## 2022-03-30 MED ORDER — OXYCODONE HCL 5 MG/5ML PO SOLN
ORAL | Status: AC
  Filled 2022-03-30: qty 5

## 2022-03-30 MED ORDER — PROPOFOL 10 MG/ML IV BOLUS
INTRAVENOUS | Status: DC | PRN
  Administered 2022-03-30: 100 mg via INTRAVENOUS

## 2022-03-30 MED ORDER — HYDROMORPHONE HCL 1 MG/ML IJ SOLN
0.2500 mg | INTRAMUSCULAR | Status: DC | PRN
  Administered 2022-03-30 (×3): 0.25 mg via INTRAVENOUS

## 2022-03-30 MED ORDER — 0.9 % SODIUM CHLORIDE (POUR BTL) OPTIME
TOPICAL | Status: DC | PRN
  Administered 2022-03-30: 1000 mL

## 2022-03-30 MED ORDER — MIDAZOLAM HCL 2 MG/2ML IJ SOLN: 2.0000 mg | Freq: Once | INTRAMUSCULAR | Status: AC

## 2022-03-30 MED ORDER — OXYCODONE HCL 5 MG PO TABS: 5.0000 mg | ORAL_TABLET | ORAL | 0 refills | Status: DC | PRN

## 2022-03-30 MED ORDER — ORAL CARE MOUTH RINSE: 15.0000 mL | Freq: Once | OROMUCOSAL | Status: AC

## 2022-03-30 MED ORDER — MIDAZOLAM HCL 2 MG/2ML IJ SOLN
INTRAMUSCULAR | Status: AC
  Administered 2022-03-30: 2 mg via INTRAVENOUS
  Filled 2022-03-30: qty 2

## 2022-03-30 MED ORDER — CEFAZOLIN SODIUM-DEXTROSE 2-4 GM/100ML-% IV SOLN
2.0000 g | INTRAVENOUS | Status: AC
Start: 2022-03-30 — End: 2022-03-30
  Administered 2022-03-30: 2 g via INTRAVENOUS
  Filled 2022-03-30: qty 100

## 2022-03-30 MED ORDER — LIDOCAINE HCL 1 % IJ SOLN
INTRAMUSCULAR | Status: AC
  Filled 2022-03-30: qty 20

## 2022-03-30 MED ORDER — HYDROMORPHONE HCL 1 MG/ML IJ SOLN
INTRAMUSCULAR | Status: AC
  Filled 2022-03-30: qty 1

## 2022-03-30 MED ORDER — GLYCOPYRROLATE PF 0.2 MG/ML IJ SOSY
PREFILLED_SYRINGE | INTRAMUSCULAR | Status: AC
  Filled 2022-03-30: qty 1

## 2022-03-30 SURGICAL SUPPLY — 37 items
BAG COUNTER SPONGE SURGICOUNT (BAG) ×2 IMPLANT
BAG SPNG CNTER NS LX DISP (BAG)
BLADE SURG 21 STRL SS (BLADE) ×2 IMPLANT
BNDG CMPR 9X4 STRL LF SNTH (GAUZE/BANDAGES/DRESSINGS)
BNDG COHESIVE 4X5 TAN STRL (GAUZE/BANDAGES/DRESSINGS) ×2 IMPLANT
BNDG ESMARK 4X9 LF (GAUZE/BANDAGES/DRESSINGS) IMPLANT
BNDG GAUZE DERMACEA FLUFF (GAUZE/BANDAGES/DRESSINGS) ×1
BNDG GAUZE DERMACEA FLUFF 4 (GAUZE/BANDAGES/DRESSINGS) IMPLANT
BNDG GAUZE ELAST 4 BULKY (GAUZE/BANDAGES/DRESSINGS) ×2 IMPLANT
BNDG GZE DERMACEA 4 6PLY (GAUZE/BANDAGES/DRESSINGS) ×1
COVER SURGICAL LIGHT HANDLE (MISCELLANEOUS) ×4 IMPLANT
DRAPE U-SHAPE 47X51 STRL (DRAPES) ×2 IMPLANT
DRSG ADAPTIC 3X8 NADH LF (GAUZE/BANDAGES/DRESSINGS) IMPLANT
DRSG PAD ABDOMINAL 8X10 ST (GAUZE/BANDAGES/DRESSINGS) ×2 IMPLANT
DURAPREP 26ML APPLICATOR (WOUND CARE) ×2 IMPLANT
ELECT REM PT RETURN 9FT ADLT (ELECTROSURGICAL) ×1
ELECTRODE REM PT RTRN 9FT ADLT (ELECTROSURGICAL) ×2 IMPLANT
GAUZE SPONGE 4X4 12PLY STRL (GAUZE/BANDAGES/DRESSINGS) IMPLANT
GAUZE SPONGE 4X4 16PLY NS LF (WOUND CARE) IMPLANT
GLOVE BIOGEL PI IND STRL 9 (GLOVE) ×2 IMPLANT
GLOVE BIOGEL PI INDICATOR 9 (GLOVE) ×1
GLOVE SURG ORTHO 9.0 STRL STRW (GLOVE) ×2 IMPLANT
GOWN STRL REIN XL XLG (GOWN DISPOSABLE) IMPLANT
GOWN STRL REUS W/ TWL XL LVL3 (GOWN DISPOSABLE) ×4 IMPLANT
GOWN STRL REUS W/TWL XL LVL3 (GOWN DISPOSABLE) ×2
KIT BASIN OR (CUSTOM PROCEDURE TRAY) ×2 IMPLANT
KIT TURNOVER KIT B (KITS) ×2 IMPLANT
MANIFOLD NEPTUNE II (INSTRUMENTS) ×2 IMPLANT
NEEDLE 22X1 1/2 (OR ONLY) (NEEDLE) IMPLANT
NS IRRIG 1000ML POUR BTL (IV SOLUTION) ×2 IMPLANT
PACK ORTHO EXTREMITY (CUSTOM PROCEDURE TRAY) ×2 IMPLANT
PAD ARMBOARD 7.5X6 YLW CONV (MISCELLANEOUS) ×4 IMPLANT
SUT ETHILON 2 0 PSLX (SUTURE) ×2 IMPLANT
SYR CONTROL 10ML LL (SYRINGE) IMPLANT
TOWEL GREEN STERILE (TOWEL DISPOSABLE) ×2 IMPLANT
TUBE CONNECTING 12X1/4 (SUCTIONS) IMPLANT
YANKAUER SUCT BULB TIP NO VENT (SUCTIONS) IMPLANT

## 2022-03-30 NOTE — Telephone Encounter (Signed)
I called and sw pharm and they wanted to confirm that they should fill medication I advised yes that the pt had just had surgery today and they advised will need to get authorization to fill from pain management. This is there policy.

## 2022-03-30 NOTE — H&P (Signed)
Susan Short is an 51 y.o. female.   Chief Complaint: Pain and deformity right foot second and third toes. HPI: The patient is a 51 year old woman who comes in today complaining of right foot pain specifically in the third toe as well as the second toe.  She is quite concerned about deformity and varus alignment of the second toe.  She is having pain.  She reports she thinks she has osteomyelitis in the third toe which is chronic   No new wounds no new injuries   Sign language interpreter present for her visit  Past Medical History:  Diagnosis Date   Arthritis    Asthma    Constipation    Deaf    left ear   Difficult intubation    Patient reports that they had to use a small tube; MAC3, grade 1 view, 1 attempt, 7.0 ETT on 05/11/09   GERD (gastroesophageal reflux disease)    Hard of hearing    right   Panic attack    Panic   Pneumonia    years ago - recorded 02/16/2019   Seizures ([place])    "due to Tramadol"   Shortness of breath dyspnea    with exertion    Past Surgical History:  Procedure Laterality Date   ABDOMINAL HYSTERECTOMY     AMPUTATION Right 04/26/2017   Procedure: Right Great Toe Amputation;  Surgeon: Newt Minion, MD;  Location: [facility];  Service: Orthopedics;  Laterality: Right;   AMPUTATION Right 02/18/2019   Procedure: RIGHT FOOT 1ST RAY AMPUTATION;  Surgeon: Newt Minion, MD;  Location: [facility];  Service: Orthopedics;  Laterality: Right;   APPENDECTOMY     as child   ARTHRODESIS METATARSAL Right 08/01/2016   Procedure: Take Down Non Union, Remove Hardware, Revision Fusion Right Great Toe;  Surgeon: Newt Minion, MD;  Location: [facility];  Service: Orthopedics;  Laterality: Right;   ARTHRODESIS METATARSALPHALANGEAL JOINT (MTPJ) Right 05/02/2016   Procedure: ARTHRODESIS METATARSALPHALANGEAL JOINT (MTPJ) RIGHT GREAT TOE;  Surgeon: Newt Minion, MD;  Location: [facility];  Service: Orthopedics;  Laterality: Right;   CERVICAL FUSION     CESAREAN SECTION     x 2   ORIF  CLAVICULAR FRACTURE Right 05/05/2013   Procedure: OPEN REDUCTION INTERNAL FIXATION (ORIF) CLAVICULAR FRACTURE- right;  Surgeon: Meredith Pel, MD;  Location: [facility];  Service: Orthopedics;  Laterality: Right;  Right Clavicle Fracture Non Union Takedown and Fixation with Bone Graft, Coracoclavicular Ligament Reconstruction with Tight Rope.   SHOULDER SURGERY Right    x 3- fracture- screws,    thumb operation Right    TONSILLECTOMY AND ADENOIDECTOMY     as a child    History reviewed. No pertinent family history. Social History:  reports that she has been smoking. She has a 35.00 pack-year smoking history. She has never used smokeless tobacco. She reports that she does not drink alcohol and does not use drugs.  Allergies:  Allergies  Allergen Reactions   Tramadol Other (See Comments)    seizures    Nsaids Hives   Toradol [Ketorolac Tromethamine] Hives   Ibuprofen Hives and Rash    No medications prior to admission.    No results found for this or any previous visit (from the past 48 hour(s)). No results found.  Review of Systems  All other systems reviewed and are negative.   Height '5\' 3"'$  (1.6 m), weight 61.2 kg. Physical Exam  Patient is alert, oriented, no adenopathy, well-dressed, normal affect,  normal respiratory effort. On examination of the right foot the patient does have a strong dorsalis pedis pulse.  She has global pain with palpation of the right foot.  There is no redness no cellulitis no warmth she does have some mild swelling of the third toe exquisite tenderness of the third toe.  There is varus alignment of the second toe Assessment/Plan 1. Pain in right foot       Plan: Will plan for amputation of the second and third toes patient is in agreement with the plan.  Newt Minion, MD 03/30/2022, 6:46 AM

## 2022-03-30 NOTE — Op Note (Signed)
03/30/2022  11:12 AM  PATIENT:  Susan Short    PRE-OPERATIVE DIAGNOSIS:  osteomyelitis and ulceration right foot second and third toes  POST-OPERATIVE DIAGNOSIS:  Same  PROCEDURE:  AMPUTATION SECOND TOE RIGHT FOOT THROUGH THE METATARSAL SHAFT.  THIRD TOE RIGHT FOOT amputation through the MTP joint  SURGEON:  Newt Minion, MD  PHYSICIAN ASSISTANT:None ANESTHESIA:   General  PREOPERATIVE INDICATIONS:  [name] is a  51 y.o. female with a diagnosis of osteomyelitis who failed conservative measures and elected for surgical management.    The risks benefits and alternatives were discussed with the patient preoperatively including but not limited to the risks of infection, bleeding, nerve injury, cardiopulmonary complications, the need for revision surgery, among others, and the patient was willing to proceed.  OPERATIVE IMPLANTS: None  _0 @  OPERATIVE FINDINGS: Good petechial bleeding no signs of infection at the resection margins.  OPERATIVE PROCEDURE: Patient was brought the operating room and underwent a MAC anesthetic.  After adequate levels anesthesia were obtained patient's right lower extremity was prepped using DuraPrep draped into a sterile field a timeout was called.  Patient underwent a digital block with 20 cc of 1% lidocaine plain.  After adequate levels anesthesia were obtained a fishmouth incision was made just proximal to the toes and the toes were amputated through the MTP joint.  The second metatarsal head was prominent and the amputation was advanced through the metatarsal shaft to provide a stable weightbearing surface.  Electrocautery was used for hemostasis wound was irrigated with normal saline incision was closed using 2-0 nylon.  A sterile dressing was applied patient was taken the PACU in stable condition.   DISCHARGE PLANNING:  Antibiotic duration: Preoperative antibiotics  Weightbearing: Touchdown weightbearing on the right  Pain  medication: Prescription for oxycodone  Dressing care/ Wound VAC: Dry dressing  Ambulatory devices: Crutches postoperative shoe  Discharge to: Home.  Follow-up: In the office 1 week post operative.

## 2022-03-30 NOTE — Telephone Encounter (Signed)
I called pt's mother advised to leave surgical dressing intact unless it becomes soiled. Should call to remove. She has a follow up appt on Wednesday will call with any other questions.

## 2022-03-30 NOTE — Interval H&P Note (Signed)
History and Physical Interval Note:  03/30/2022 10:23 AM  Susan Short  has presented today for surgery, with the diagnosis of osteomyelitis.  The various methods of treatment have been discussed with the patient and family. After consideration of risks, benefits and other options for treatment, the patient has consented to  Procedure(s): AMPUTATION SECOND AND THIRD TOE RIGHT FOOT (Right) as a surgical intervention.  The patient's history has been reviewed, patient examined, no change in status, stable for surgery.  I have reviewed the patient's chart and labs.  Questions were answered to the patient's satisfaction.     Newt Minion

## 2022-03-30 NOTE — Anesthesia Preprocedure Evaluation (Addendum)
Anesthesia Evaluation  Patient identified by MRN, date of birth, ID band Patient awake  General Assessment Comment:Hx/o previous difficult intubation in 2010  Reviewed: Allergy & Precautions, NPO status , Patient's Chart, lab work & pertinent test results  History of Anesthesia Complications (+) DIFFICULT AIRWAY and history of anesthetic complications  Airway Mallampati: II  TM Distance: >3 FB Neck ROM: Full    Dental  (+) Dental Advisory Given, Edentulous Lower, Edentulous Upper   Pulmonary shortness of breath and with exertion, asthma , pneumonia, resolved, Current Smoker and Patient abstained from smoking.,    Pulmonary exam normal breath sounds clear to auscultation       Cardiovascular negative cardio ROS Normal cardiovascular exam Rhythm:Regular Rate:Normal     Neuro/Psych Seizures -,  PSYCHIATRIC DISORDERS Anxiety L ear deafness Uses sign language    GI/Hepatic Neg liver ROS, GERD  Medicated,  Endo/Other  Hyperlipidemia  Renal/GU negative Renal ROS     Musculoskeletal  (+) Arthritis , Osteomyelitis Right foot   Abdominal   Peds  Hematology negative hematology ROS (+)   Anesthesia Other Findings Day of surgery medications reviewed with the patient.  Reproductive/Obstetrics                             Anesthesia Physical  Anesthesia Plan  ASA: 3  Anesthesia Plan: MAC   Post-op Pain Management:    Induction: Intravenous  PONV Risk Score and Plan: 1 and Propofol infusion and Treatment may vary due to age or medical condition  Airway Management Planned: Nasal Cannula and Natural Airway  Additional Equipment: None  Intra-op Plan:   Post-operative Plan:   Informed Consent: I have reviewed the patients History and Physical, chart, labs and discussed the procedure including the risks, benefits and alternatives for the proposed anesthesia with the patient or authorized  representative who has indicated his/her understanding and acceptance.     Dental advisory given  Plan Discussed with: CRNA  Anesthesia Plan Comments:        Anesthesia Quick Evaluation

## 2022-03-30 NOTE — Telephone Encounter (Signed)
Patient's mother [name] called advised patient went to the pharmacy and could not get the Oxycodone because patient was on Suboxone Strips.  Patient's mother said she lives far away from the pharmacy and want to wait until patient can get approval to get the medication. [facility] [place], [place]   Phone # is [phone]    The number to contact linda is [phone]

## 2022-03-30 NOTE — Anesthesia Procedure Notes (Signed)
Procedure Name: MAC Date/Time: 03/30/2022 10:23 AM  Performed by: Mariea Clonts, CRNAPre-anesthesia Checklist: Patient identified, Emergency Drugs available, Suction available, Patient being monitored and Timeout performed Patient Re-evaluated:Patient Re-evaluated prior to induction Oxygen Delivery Method: Simple face mask

## 2022-03-30 NOTE — Anesthesia Postprocedure Evaluation (Signed)
Anesthesia Post Note  Patient: Susan Short  Procedure(s) Performed: AMPUTATION SECOND AND THIRD TOE RIGHT FOOT (Right: Toe)     Patient location during evaluation: PACU Anesthesia Type: MAC Level of consciousness: awake and alert Pain management: pain level controlled Vital Signs Assessment: post-procedure vital signs reviewed and stable Respiratory status: spontaneous breathing, nonlabored ventilation and respiratory function stable Cardiovascular status: stable and blood pressure returned to baseline Postop Assessment: no apparent nausea or vomiting Anesthetic complications: no   No notable events documented.  Last Vitals:  Vitals:   03/30/22 1130 03/30/22 1200  BP: 120/81 110/67  Pulse: (!) 54 (!) 56  Resp: 17 11  Temp:    SpO2: 100% 100%    Last Pain:  Vitals:   03/30/22 1116  TempSrc:   PainSc: Asleep                 Starlit Raburn A.

## 2022-03-30 NOTE — Transfer of Care (Addendum)
Immediate Anesthesia Transfer of Care Note  Patient: Susan Short  Procedure(s) Performed: AMPUTATION SECOND AND THIRD TOE RIGHT FOOT (Right: Toe)  Patient Location: PACU  Anesthesia Type:MAC  Level of Consciousness: drowsy  Airway & Oxygen Therapy: Patient Spontanous Breathing and Patient connected to face mask oxygen  Post-op Assessment: Report given to RN and Post -op Vital signs reviewed and stable  Post vital signs: Reviewed and stable  Last Vitals:  Vitals Value Taken Time  BP 99/65 03/30/22 1116  Temp 36.2 C 03/30/22 1116  Pulse 55 03/30/22 1120  Resp 15 03/30/22 1120  SpO2 100 % 03/30/22 1120  Vitals shown include unvalidated device data.  Last Pain:  Vitals:   03/30/22 1116  TempSrc:   PainSc: Asleep      Patients Stated Pain Goal: 2 (11/65/79 0383)  Complications: No notable events documented.

## 2022-03-30 NOTE — Telephone Encounter (Signed)
Patient's mother Susan Short asked if Dr Sharol Given would call her concerning the surgery. Susan Short said she need to know how the surgery went and how to care for her right foot. The number to contact Susan Short is [phone]

## 2022-03-30 NOTE — Progress Notes (Signed)
Orthopedic Tech Progress Note Patient Details:  Susan Short June 21, 1971 [id number]  Ortho Devices Type of Ortho Device: Postop shoe/boot Ortho Device/Splint Location: RLE Ortho Device/Splint Interventions: Ordered, Application, Adjustment   Post Interventions Patient Tolerated: Well  [name] 03/30/2022, 12:50 PM

## 2022-03-31 ENCOUNTER — Encounter (HOSPITAL_COMMUNITY): Payer: Self-pay | Admitting: Orthopedic Surgery

## 2022-04-02 ENCOUNTER — Telehealth: Payer: Self-pay | Admitting: Orthopedic Surgery

## 2022-04-02 NOTE — Telephone Encounter (Signed)
Chryl's mother, [name], called in stating that the pain medication she is on post operatively is not working for her.  She would like to have Percocet 10 called into the pharmacy.  [facility] [place].  Call back # is [phone].

## 2022-04-02 NOTE — Telephone Encounter (Signed)
S/p 03/30/22 right 2nd and 3rd toe amputation

## 2022-04-02 NOTE — Telephone Encounter (Signed)
Pts mother informed of info below.

## 2022-04-02 NOTE — Telephone Encounter (Signed)
Pt mother called again

## 2022-04-04 ENCOUNTER — Telehealth: Payer: Self-pay | Admitting: Orthopedic Surgery

## 2022-04-04 ENCOUNTER — Encounter: Payer: Medicaid Other | Admitting: Family

## 2022-04-04 ENCOUNTER — Other Ambulatory Visit: Payer: Self-pay | Admitting: Family

## 2022-04-04 ENCOUNTER — Telehealth: Payer: Self-pay | Admitting: Family

## 2022-04-04 MED ORDER — OXYCODONE HCL 5 MG PO TABS
5.0000 mg | ORAL_TABLET | ORAL | 0 refills | Status: DC | PRN
Start: 2022-04-04 — End: 2022-04-04

## 2022-04-04 MED ORDER — OXYCODONE HCL 5 MG PO TABS: 5.0000 mg | ORAL_TABLET | Freq: Four times a day (QID) | ORAL | 0 refills | Status: AC | PRN

## 2022-04-04 NOTE — Telephone Encounter (Signed)
Erin notified, She will send in for a larger d/s other than 7 days.

## 2022-04-04 NOTE — Telephone Encounter (Signed)
Patient's mother [name] called advised the Rx for Oxycodone need prior approval before it can  filled. The number to contact [name] is [phone]

## 2022-04-04 NOTE — Telephone Encounter (Signed)
S/p 2nd and 3rd toe amputation 03/30/22. Oxycodone refilled 03/30/22. [name] suggested she take 2 tablets at a time on Monday 04/02/22 due to pain not being managed. Please advise.

## 2022-04-04 NOTE — Telephone Encounter (Signed)
Pt's mother canceled appt due to no transportation. Rescheduled for next wed. Pt requesting refill of pain medication. Please send to pharmacy on file. Pt's mother phone number [phone].

## 2022-04-11 ENCOUNTER — Encounter: Payer: Medicaid Other | Admitting: Family

## 2022-05-08 ENCOUNTER — Telehealth: Payer: Self-pay | Admitting: Orthopedic Surgery

## 2022-05-08 NOTE — Telephone Encounter (Signed)
Pt's mother called for refill of pregabalin. Please send to pharmacy on file.Marland Kitchen Please send to pharmacy on file. Pt phone number is [phone].

## 2022-05-08 NOTE — Telephone Encounter (Signed)
I called and advised the pt had surgery and never came into the office for follow up. Unable to refill medication pt must sch follow up appt. Pt's mother advised that she will let her know.

## 2022-06-13 ENCOUNTER — Ambulatory Visit: Payer: Medicaid Other | Admitting: Orthopaedic Surgery

## 2022-06-18 ENCOUNTER — Ambulatory Visit: Payer: Medicaid Other | Admitting: Orthopaedic Surgery

## 2022-06-19 ENCOUNTER — Ambulatory Visit: Payer: Medicaid Other | Admitting: Orthopaedic Surgery

## 2022-06-26 ENCOUNTER — Ambulatory Visit: Payer: Medicaid Other | Admitting: Orthopaedic Surgery

## 2022-07-03 ENCOUNTER — Ambulatory Visit: Payer: Medicaid Other | Admitting: Orthopaedic Surgery

## 2022-12-20 ENCOUNTER — Ambulatory Visit: Payer: Medicaid Other | Admitting: Orthopedic Surgery

## 2024-07-28 ENCOUNTER — Ambulatory Visit: Payer: MEDICAID | Admitting: Orthopedic Surgery

## 2024-07-28 ENCOUNTER — Encounter: Payer: Self-pay | Admitting: Orthopedic Surgery

## 2024-07-28 ENCOUNTER — Other Ambulatory Visit: Payer: MEDICAID

## 2024-07-28 DIAGNOSIS — M79671 Pain in right foot: Secondary | ICD-10-CM

## 2024-07-28 MED ORDER — PREGABALIN 100 MG PO CAPS: 100.0000 mg | ORAL_CAPSULE | Freq: Every day | ORAL | 3 refills | Status: AC

## 2024-07-28 NOTE — Progress Notes (Signed)
 Office Visit Note   Patient: Susan Short           Date of Birth: [birth year]           MRN: [id number] Visit Date: 07/28/2024              Requested by: Norval Kettle, MD [address], [address] [place],  [place] [address] PCP: Norval Kettle, MD  Chief Complaint  Patient presents with   Right Foot - Pain      HPI: Discussed the use of AI scribe software for clinical note transcription with the patient, who gave verbal consent to proceed.  History of Present Illness Susan Short is a 53 year old female with a history of medial column ray amputations who presents with foot pain and fever.  She experiences significant pain in her foot, describing it as 'the whole thing hurts' and noting that she has to walk sideways due to the discomfort. She mentions a 'knot' in her foot and states that she cannot walk normally. She is wearing clogs. She has a history of medial column ray amputations, including the first ray and amputation of the second and third toes at the MTP joint. She has been previously treated with Lyrica  for neuropathy, taking 100 mg.  She reports having a fever and a cold, with symptoms including a cough and sore throat. No urinary tract symptoms. She mentions having a temperature earlier in the day.     Assessment & Plan: Visit Diagnoses:  1. 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 Radiographs showed no infection or acute bony changes. Circulation intact with palpable pulses. - Prescribed Lyrica  100 mg for neuropathic pain. - Advised follow-up with primary care for fever and pulmonary symptoms.      Follow-Up Instructions: Return if symptoms worsen or fail to improve.   Ortho Exam  Patient is alert, oriented, no adenopathy, well-dressed, normal affect,  normal respiratory effort. Physical Exam EXTREMITIES: Right foot without redness, cellulitis, or swelling. Palpable dorsalis pedis and posterior tibial pulse, no arterial insufficiency.      Imaging: XR Foot 2 Views Right Result Date: 07/28/2024 2 view radiographs of the right foot shows previous first ray amputation as well as amputation of the 2nd and 3rd toes.  There were no fractures no malalignment no changes of Charcot arthropathy.  No soft tissue masses visible.  No images are attached to the encounter.  Labs: Lab Results  Component Value Date   LABURIC 5.5 01/13/2018     Lab Results  Component Value Date   ALBUMIN 4.7 01/03/2012    No results found for: MG No results found for: VD25OH  No results found for: PREALBUMIN    Latest Ref Rng & Units 03/30/2022    8:45 AM 02/18/2019    7:19 AM 04/26/2017    6:27 AM  CBC EXTENDED  WBC 4.0 - 10.5 K/uL  7.1  6.1   RBC 3.87 - 5.11 MIL/uL  4.55  4.30   Hemoglobin 12.0 - 15.0 g/dL 87.0  85.9  87.0   HCT 36.0 - 46.0 % 38.0  41.6  38.3   Platelets 150 - 400 K/uL  263  227      There is no height or weight on file to calculate BMI.  Orders:  Orders Placed  This Encounter  Procedures   XR Foot 2 Views Right   Meds ordered this encounter  Medications   pregabalin  (LYRICA ) 100 MG capsule    Sig: Take 1 capsule (100 mg total) by mouth daily.    Dispense:  30 capsule    Refill:  3    Not to exceed 4 additional fills before 09/25/2023PT NEED NEW RX, PLEASE SEND OVER NEW RX......     Procedures: No procedures performed  Clinical Data: No additional findings.  ROS:  All other systems negative, except as noted in the HPI. Review of Systems  Objective: Vital Signs: There were no vitals taken for this visit.  Specialty Comments:  No specialty comments available.  PMFS History: Patient Active Problem List   Diagnosis Date Noted   Spinal stenosis of cervical region 09/16/2019   Hx of fusion of cervical  spine 09/16/2019   History of partial ray amputation of first toe of right foot 03/11/2019   Subacute osteomyelitis, right ankle and foot (HCC)    Neuropathic pain of foot, right 09/02/2017   Great toe amputation status, right 05/15/2017   Pseudarthrosis after fusion or arthrodesis 12/12/2016   Retained orthopedic hardware    Nonunion of foot fracture, right 07/13/2016   Hearing loss 10/13/2010   Allergic rhinitis 10/13/2010   PULMONARY NODULE 10/13/2010   Benign neoplasm of thyroid gland 09/22/2010   HYPERLIPIDEMIA 09/22/2010   Past Medical History:  Diagnosis Date   Arthritis    Asthma    Constipation    Deaf    left ear   Difficult intubation    Patient reports that they had to use a small tube; MAC3, grade 1 view, 1 attempt, 7.0 ETT on 05/11/09   GERD (gastroesophageal reflux disease)    Hard of hearing    right   Panic attack    Panic   Pneumonia    years ago - recorded 02/16/2019   Seizures (HCC)    due to Tramadol    Shortness of breath dyspnea    with exertion    History reviewed. No pertinent family history.  Past Surgical History:  Procedure Laterality Date   ABDOMINAL HYSTERECTOMY     AMPUTATION Right 04/26/2017   Procedure: Right Great Toe Amputation;  Surgeon: Harden Jerona GAILS, MD;  Location: [facility] OR;  Service: Orthopedics;  Laterality: Right;   AMPUTATION Right 02/18/2019   Procedure: RIGHT FOOT 1ST RAY AMPUTATION;  Surgeon: Harden Jerona GAILS, MD;  Location: [facility] OR;  Service: Orthopedics;  Laterality: Right;   AMPUTATION Right 03/30/2022   Procedure: AMPUTATION SECOND AND THIRD TOE RIGHT FOOT;  Surgeon: Harden Jerona GAILS, MD;  Location: [facility] OR;  Service: Orthopedics;  Laterality: Right;   APPENDECTOMY     as child   ARTHRODESIS METATARSAL Right 08/01/2016   Procedure: Take Down Non Union, Remove Hardware, Revision Fusion Right Great Toe;  Surgeon: Jerona GAILS Harden, MD;  Location: [facility] OR;  Service: Orthopedics;  Laterality: Right;   ARTHRODESIS METATARSALPHALANGEAL JOINT  (MTPJ) Right 05/02/2016   Procedure: ARTHRODESIS METATARSALPHALANGEAL JOINT (MTPJ) RIGHT GREAT TOE;  Surgeon: Jerona GAILS Harden, MD;  Location: [facility] OR;  Service: Orthopedics;  Laterality: Right;   CERVICAL FUSION     CESAREAN SECTION     x 2   ORIF CLAVICULAR FRACTURE Right 05/05/2013   Procedure: OPEN REDUCTION INTERNAL FIXATION (ORIF) CLAVICULAR FRACTURE- right;  Surgeon: Cordella Glendia Hutchinson, MD;  Location: [facility] OR;  Service: Orthopedics;  Laterality: Right;  Right Clavicle Fracture Non Union Takedown and  Fixation with Bone Graft, Coracoclavicular Ligament Reconstruction with Tight Rope.   SHOULDER SURGERY Right    x 3- fracture- screws,    thumb operation Right    TONSILLECTOMY AND ADENOIDECTOMY     as a child   Social History   Occupational History   Not on file  Tobacco Use   Smoking status: Every Day    Current packs/day: 1.00    Average packs/day: 1 pack/day for 35.0 years (35.0 ttl pk-yrs)    Types: Cigarettes   Smokeless tobacco: Never  Vaping Use   Vaping status: Never Used  Substance and Sexual Activity   Alcohol use: No   Drug use: No   Sexual activity: Not on file

## 2024-09-24 ENCOUNTER — Ambulatory Visit: Admitting: Orthopedic Surgery
